# Patient Record
Sex: Male | Born: 1950 | Race: White | Hispanic: No | Marital: Married | Smoking: Current some day smoker
Health system: Southern US, Community
[De-identification: ages and names within clinical notes are randomized; demographics above are authoritative.]

## PROBLEM LIST (undated history)

## (undated) DIAGNOSIS — E271 Primary adrenocortical insufficiency: Secondary | ICD-10-CM

## (undated) DIAGNOSIS — D6861 Antiphospholipid syndrome: Secondary | ICD-10-CM

## (undated) DIAGNOSIS — Z8719 Personal history of other diseases of the digestive system: Secondary | ICD-10-CM

## (undated) DIAGNOSIS — C37 Malignant neoplasm of thymus: Secondary | ICD-10-CM

## (undated) DIAGNOSIS — G4733 Obstructive sleep apnea (adult) (pediatric): Secondary | ICD-10-CM

## (undated) DIAGNOSIS — K219 Gastro-esophageal reflux disease without esophagitis: Secondary | ICD-10-CM

## (undated) DIAGNOSIS — F32A Depression, unspecified: Secondary | ICD-10-CM

## (undated) DIAGNOSIS — Z9289 Personal history of other medical treatment: Secondary | ICD-10-CM

## (undated) DIAGNOSIS — I499 Cardiac arrhythmia, unspecified: Secondary | ICD-10-CM

## (undated) DIAGNOSIS — G4730 Sleep apnea, unspecified: Secondary | ICD-10-CM

## (undated) DIAGNOSIS — G0481 Other encephalitis and encephalomyelitis: Secondary | ICD-10-CM

## (undated) DIAGNOSIS — F329 Major depressive disorder, single episode, unspecified: Secondary | ICD-10-CM

## (undated) DIAGNOSIS — Z9989 Dependence on other enabling machines and devices: Secondary | ICD-10-CM

## (undated) HISTORY — DX: Personal history of other diseases of the digestive system: Z87.19

## (undated) HISTORY — DX: Major depressive disorder, single episode, unspecified: F32.9

## (undated) HISTORY — PX: ESOPHAGOGASTRODUODENOSCOPY: SHX1529

## (undated) HISTORY — DX: Obstructive sleep apnea (adult) (pediatric): G47.33

## (undated) HISTORY — DX: Sleep apnea, unspecified: G47.30

## (undated) HISTORY — PX: PACEMAKER PLACEMENT: SHX43

## (undated) HISTORY — DX: Primary adrenocortical insufficiency: E27.1

## (undated) HISTORY — DX: Depression, unspecified: F32.A

## (undated) HISTORY — DX: Dependence on other enabling machines and devices: Z99.89

## (undated) HISTORY — DX: Personal history of other medical treatment: Z92.89

## (undated) HISTORY — DX: Gastro-esophageal reflux disease without esophagitis: K21.9

## (undated) HISTORY — DX: Antiphospholipid syndrome: D68.61

## (undated) HISTORY — PX: NOSE SURGERY: SHX723

## (undated) HISTORY — DX: Cardiac arrhythmia, unspecified: I49.9

## (undated) HISTORY — DX: Other encephalitis and encephalomyelitis: G04.81

## (undated) HISTORY — DX: Malignant neoplasm of thymus: C37

---
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
## 1998-09-23 HISTORY — PX: CHOLECYSTECTOMY: SHX55

## 2013-09-23 DIAGNOSIS — C37 Malignant neoplasm of thymus: Secondary | ICD-10-CM

## 2013-09-23 HISTORY — DX: Malignant neoplasm of thymus: C37

## 2014-09-23 HISTORY — PX: COLONOSCOPY: SHX174

## 2015-01-06 ENCOUNTER — Encounter: Payer: Self-pay | Admitting: Gastroenterology

## 2016-09-24 DIAGNOSIS — F410 Panic disorder [episodic paroxysmal anxiety] without agoraphobia: Secondary | ICD-10-CM | POA: Diagnosis not present

## 2016-09-24 DIAGNOSIS — F331 Major depressive disorder, recurrent, moderate: Secondary | ICD-10-CM | POA: Diagnosis not present

## 2016-09-24 DIAGNOSIS — R4182 Altered mental status, unspecified: Secondary | ICD-10-CM | POA: Diagnosis not present

## 2016-09-24 DIAGNOSIS — G9340 Encephalopathy, unspecified: Secondary | ICD-10-CM | POA: Diagnosis not present

## 2016-09-24 DIAGNOSIS — C37 Malignant neoplasm of thymus: Secondary | ICD-10-CM | POA: Diagnosis not present

## 2016-09-27 DIAGNOSIS — G0490 Encephalitis and encephalomyelitis, unspecified: Secondary | ICD-10-CM | POA: Diagnosis not present

## 2016-09-27 DIAGNOSIS — Z599 Problem related to housing and economic circumstances, unspecified: Secondary | ICD-10-CM | POA: Diagnosis not present

## 2016-09-30 DIAGNOSIS — R791 Abnormal coagulation profile: Secondary | ICD-10-CM | POA: Diagnosis not present

## 2016-09-30 DIAGNOSIS — D6861 Antiphospholipid syndrome: Secondary | ICD-10-CM | POA: Diagnosis not present

## 2016-09-30 DIAGNOSIS — Z86718 Personal history of other venous thrombosis and embolism: Secondary | ICD-10-CM | POA: Diagnosis not present

## 2016-09-30 DIAGNOSIS — Z5181 Encounter for therapeutic drug level monitoring: Secondary | ICD-10-CM | POA: Diagnosis not present

## 2016-09-30 DIAGNOSIS — Z7901 Long term (current) use of anticoagulants: Secondary | ICD-10-CM | POA: Diagnosis not present

## 2016-10-03 DIAGNOSIS — F331 Major depressive disorder, recurrent, moderate: Secondary | ICD-10-CM | POA: Diagnosis not present

## 2016-10-03 DIAGNOSIS — Z79899 Other long term (current) drug therapy: Secondary | ICD-10-CM | POA: Diagnosis not present

## 2016-10-03 DIAGNOSIS — G9340 Encephalopathy, unspecified: Secondary | ICD-10-CM | POA: Diagnosis not present

## 2016-10-03 DIAGNOSIS — F410 Panic disorder [episodic paroxysmal anxiety] without agoraphobia: Secondary | ICD-10-CM | POA: Diagnosis not present

## 2016-10-03 DIAGNOSIS — C37 Malignant neoplasm of thymus: Secondary | ICD-10-CM | POA: Diagnosis not present

## 2016-10-03 DIAGNOSIS — G0490 Encephalitis and encephalomyelitis, unspecified: Secondary | ICD-10-CM | POA: Diagnosis not present

## 2016-10-03 DIAGNOSIS — Z139 Encounter for screening, unspecified: Secondary | ICD-10-CM | POA: Diagnosis not present

## 2016-10-04 DIAGNOSIS — F419 Anxiety disorder, unspecified: Secondary | ICD-10-CM | POA: Diagnosis not present

## 2016-10-04 DIAGNOSIS — Z86718 Personal history of other venous thrombosis and embolism: Secondary | ICD-10-CM | POA: Diagnosis not present

## 2016-10-04 DIAGNOSIS — Z85238 Personal history of other malignant neoplasm of thymus: Secondary | ICD-10-CM | POA: Diagnosis not present

## 2016-10-04 DIAGNOSIS — Z5181 Encounter for therapeutic drug level monitoring: Secondary | ICD-10-CM | POA: Diagnosis not present

## 2016-10-04 DIAGNOSIS — D6861 Antiphospholipid syndrome: Secondary | ICD-10-CM | POA: Diagnosis not present

## 2016-10-04 DIAGNOSIS — R569 Unspecified convulsions: Secondary | ICD-10-CM | POA: Diagnosis not present

## 2016-10-04 DIAGNOSIS — F329 Major depressive disorder, single episode, unspecified: Secondary | ICD-10-CM | POA: Diagnosis not present

## 2016-10-04 DIAGNOSIS — R791 Abnormal coagulation profile: Secondary | ICD-10-CM | POA: Diagnosis not present

## 2016-10-04 DIAGNOSIS — Z7901 Long term (current) use of anticoagulants: Secondary | ICD-10-CM | POA: Diagnosis not present

## 2016-10-04 DIAGNOSIS — K219 Gastro-esophageal reflux disease without esophagitis: Secondary | ICD-10-CM | POA: Diagnosis not present

## 2016-10-10 DIAGNOSIS — K219 Gastro-esophageal reflux disease without esophagitis: Secondary | ICD-10-CM | POA: Diagnosis present

## 2016-10-10 DIAGNOSIS — E871 Hypo-osmolality and hyponatremia: Secondary | ICD-10-CM | POA: Diagnosis not present

## 2016-10-10 DIAGNOSIS — Z950 Presence of cardiac pacemaker: Secondary | ICD-10-CM | POA: Diagnosis not present

## 2016-10-10 DIAGNOSIS — F329 Major depressive disorder, single episode, unspecified: Secondary | ICD-10-CM | POA: Diagnosis present

## 2016-10-10 DIAGNOSIS — Z923 Personal history of irradiation: Secondary | ICD-10-CM | POA: Diagnosis not present

## 2016-10-10 DIAGNOSIS — G4733 Obstructive sleep apnea (adult) (pediatric): Secondary | ICD-10-CM | POA: Diagnosis present

## 2016-10-10 DIAGNOSIS — G0481 Other encephalitis and encephalomyelitis: Secondary | ICD-10-CM | POA: Diagnosis not present

## 2016-10-10 DIAGNOSIS — Z7952 Long term (current) use of systemic steroids: Secondary | ICD-10-CM | POA: Diagnosis not present

## 2016-10-10 DIAGNOSIS — Z5189 Encounter for other specified aftercare: Secondary | ICD-10-CM | POA: Diagnosis not present

## 2016-10-10 DIAGNOSIS — Z79899 Other long term (current) drug therapy: Secondary | ICD-10-CM | POA: Diagnosis not present

## 2016-10-10 DIAGNOSIS — K612 Anorectal abscess: Secondary | ICD-10-CM | POA: Diagnosis not present

## 2016-10-10 DIAGNOSIS — R221 Localized swelling, mass and lump, neck: Secondary | ICD-10-CM | POA: Diagnosis not present

## 2016-10-10 DIAGNOSIS — G40409 Other generalized epilepsy and epileptic syndromes, not intractable, without status epilepticus: Secondary | ICD-10-CM | POA: Diagnosis not present

## 2016-10-10 DIAGNOSIS — F410 Panic disorder [episodic paroxysmal anxiety] without agoraphobia: Secondary | ICD-10-CM | POA: Diagnosis present

## 2016-10-10 DIAGNOSIS — R791 Abnormal coagulation profile: Secondary | ICD-10-CM | POA: Diagnosis not present

## 2016-10-10 DIAGNOSIS — R410 Disorientation, unspecified: Secondary | ICD-10-CM | POA: Diagnosis not present

## 2016-10-10 DIAGNOSIS — E2740 Unspecified adrenocortical insufficiency: Secondary | ICD-10-CM | POA: Diagnosis not present

## 2016-10-10 DIAGNOSIS — Z9221 Personal history of antineoplastic chemotherapy: Secondary | ICD-10-CM | POA: Diagnosis not present

## 2016-10-10 DIAGNOSIS — Z85238 Personal history of other malignant neoplasm of thymus: Secondary | ICD-10-CM | POA: Diagnosis not present

## 2016-10-10 DIAGNOSIS — R911 Solitary pulmonary nodule: Secondary | ICD-10-CM | POA: Diagnosis not present

## 2016-10-10 DIAGNOSIS — Z86718 Personal history of other venous thrombosis and embolism: Secondary | ICD-10-CM | POA: Diagnosis not present

## 2016-10-10 DIAGNOSIS — K610 Anal abscess: Secondary | ICD-10-CM | POA: Diagnosis not present

## 2016-10-10 DIAGNOSIS — J90 Pleural effusion, not elsewhere classified: Secondary | ICD-10-CM | POA: Diagnosis not present

## 2016-10-10 DIAGNOSIS — M7981 Nontraumatic hematoma of soft tissue: Secondary | ICD-10-CM | POA: Diagnosis not present

## 2016-10-10 DIAGNOSIS — Z7901 Long term (current) use of anticoagulants: Secondary | ICD-10-CM | POA: Diagnosis not present

## 2016-10-10 DIAGNOSIS — G0490 Encephalitis and encephalomyelitis, unspecified: Secondary | ICD-10-CM | POA: Diagnosis not present

## 2016-10-10 DIAGNOSIS — Z452 Encounter for adjustment and management of vascular access device: Secondary | ICD-10-CM | POA: Diagnosis not present

## 2016-10-10 DIAGNOSIS — K589 Irritable bowel syndrome without diarrhea: Secondary | ICD-10-CM | POA: Diagnosis present

## 2016-10-10 DIAGNOSIS — R451 Restlessness and agitation: Secondary | ICD-10-CM | POA: Diagnosis not present

## 2016-10-10 DIAGNOSIS — D6861 Antiphospholipid syndrome: Secondary | ICD-10-CM | POA: Diagnosis not present

## 2016-10-10 DIAGNOSIS — C37 Malignant neoplasm of thymus: Secondary | ICD-10-CM | POA: Diagnosis not present

## 2016-10-25 DIAGNOSIS — D8989 Other specified disorders involving the immune mechanism, not elsewhere classified: Secondary | ICD-10-CM | POA: Diagnosis present

## 2016-10-25 DIAGNOSIS — Z48817 Encounter for surgical aftercare following surgery on the skin and subcutaneous tissue: Secondary | ICD-10-CM | POA: Diagnosis not present

## 2016-10-25 DIAGNOSIS — Z9221 Personal history of antineoplastic chemotherapy: Secondary | ICD-10-CM | POA: Diagnosis not present

## 2016-10-25 DIAGNOSIS — K589 Irritable bowel syndrome without diarrhea: Secondary | ICD-10-CM | POA: Diagnosis present

## 2016-10-25 DIAGNOSIS — D6861 Antiphospholipid syndrome: Secondary | ICD-10-CM | POA: Diagnosis not present

## 2016-10-25 DIAGNOSIS — Z7901 Long term (current) use of anticoagulants: Secondary | ICD-10-CM | POA: Diagnosis not present

## 2016-10-25 DIAGNOSIS — K612 Anorectal abscess: Secondary | ICD-10-CM | POA: Diagnosis not present

## 2016-10-25 DIAGNOSIS — G9340 Encephalopathy, unspecified: Secondary | ICD-10-CM | POA: Diagnosis not present

## 2016-10-25 DIAGNOSIS — G40409 Other generalized epilepsy and epileptic syndromes, not intractable, without status epilepticus: Secondary | ICD-10-CM | POA: Diagnosis not present

## 2016-10-25 DIAGNOSIS — C781 Secondary malignant neoplasm of mediastinum: Secondary | ICD-10-CM | POA: Diagnosis present

## 2016-10-25 DIAGNOSIS — Z950 Presence of cardiac pacemaker: Secondary | ICD-10-CM | POA: Diagnosis not present

## 2016-10-25 DIAGNOSIS — Z923 Personal history of irradiation: Secondary | ICD-10-CM | POA: Diagnosis not present

## 2016-10-25 DIAGNOSIS — G40909 Epilepsy, unspecified, not intractable, without status epilepticus: Secondary | ICD-10-CM | POA: Diagnosis not present

## 2016-10-25 DIAGNOSIS — C37 Malignant neoplasm of thymus: Secondary | ICD-10-CM | POA: Diagnosis not present

## 2016-10-25 DIAGNOSIS — Z85238 Personal history of other malignant neoplasm of thymus: Secondary | ICD-10-CM | POA: Diagnosis not present

## 2016-10-25 DIAGNOSIS — Z7952 Long term (current) use of systemic steroids: Secondary | ICD-10-CM | POA: Diagnosis not present

## 2016-10-25 DIAGNOSIS — R410 Disorientation, unspecified: Secondary | ICD-10-CM | POA: Diagnosis not present

## 2016-10-25 DIAGNOSIS — K6289 Other specified diseases of anus and rectum: Secondary | ICD-10-CM | POA: Diagnosis not present

## 2016-10-25 DIAGNOSIS — R531 Weakness: Secondary | ICD-10-CM | POA: Diagnosis not present

## 2016-10-25 DIAGNOSIS — G0481 Other encephalitis and encephalomyelitis: Secondary | ICD-10-CM | POA: Diagnosis not present

## 2016-10-25 DIAGNOSIS — G0490 Encephalitis and encephalomyelitis, unspecified: Secondary | ICD-10-CM | POA: Diagnosis not present

## 2016-10-25 DIAGNOSIS — F05 Delirium due to known physiological condition: Secondary | ICD-10-CM | POA: Diagnosis not present

## 2016-10-25 DIAGNOSIS — K219 Gastro-esophageal reflux disease without esophagitis: Secondary | ICD-10-CM | POA: Diagnosis present

## 2016-10-25 DIAGNOSIS — Z7902 Long term (current) use of antithrombotics/antiplatelets: Secondary | ICD-10-CM | POA: Diagnosis not present

## 2016-10-25 DIAGNOSIS — M6250 Muscle wasting and atrophy, not elsewhere classified, unspecified site: Secondary | ICD-10-CM | POA: Diagnosis not present

## 2016-10-25 DIAGNOSIS — Z86718 Personal history of other venous thrombosis and embolism: Secondary | ICD-10-CM | POA: Diagnosis not present

## 2016-10-25 DIAGNOSIS — G4733 Obstructive sleep apnea (adult) (pediatric): Secondary | ICD-10-CM | POA: Diagnosis present

## 2016-10-25 DIAGNOSIS — Z883 Allergy status to other anti-infective agents status: Secondary | ICD-10-CM | POA: Diagnosis not present

## 2016-10-25 DIAGNOSIS — E2740 Unspecified adrenocortical insufficiency: Secondary | ICD-10-CM | POA: Diagnosis not present

## 2016-10-25 DIAGNOSIS — F331 Major depressive disorder, recurrent, moderate: Secondary | ICD-10-CM | POA: Diagnosis not present

## 2016-10-25 DIAGNOSIS — F419 Anxiety disorder, unspecified: Secondary | ICD-10-CM | POA: Diagnosis not present

## 2016-10-25 DIAGNOSIS — Z881 Allergy status to other antibiotic agents status: Secondary | ICD-10-CM | POA: Diagnosis not present

## 2016-11-02 DIAGNOSIS — D6861 Antiphospholipid syndrome: Secondary | ICD-10-CM | POA: Diagnosis not present

## 2016-11-02 DIAGNOSIS — K611 Rectal abscess: Secondary | ICD-10-CM | POA: Diagnosis not present

## 2016-11-02 DIAGNOSIS — M359 Systemic involvement of connective tissue, unspecified: Secondary | ICD-10-CM | POA: Diagnosis not present

## 2016-11-02 DIAGNOSIS — G053 Encephalitis and encephalomyelitis in diseases classified elsewhere: Secondary | ICD-10-CM | POA: Diagnosis not present

## 2016-11-02 DIAGNOSIS — E2740 Unspecified adrenocortical insufficiency: Secondary | ICD-10-CM | POA: Diagnosis not present

## 2016-11-02 DIAGNOSIS — R569 Unspecified convulsions: Secondary | ICD-10-CM | POA: Diagnosis not present

## 2016-11-04 DIAGNOSIS — Z7901 Long term (current) use of anticoagulants: Secondary | ICD-10-CM | POA: Diagnosis not present

## 2016-11-04 DIAGNOSIS — D6861 Antiphospholipid syndrome: Secondary | ICD-10-CM | POA: Diagnosis not present

## 2016-11-04 DIAGNOSIS — Z86718 Personal history of other venous thrombosis and embolism: Secondary | ICD-10-CM | POA: Diagnosis not present

## 2016-11-04 DIAGNOSIS — Z5181 Encounter for therapeutic drug level monitoring: Secondary | ICD-10-CM | POA: Diagnosis not present

## 2016-11-06 DIAGNOSIS — M359 Systemic involvement of connective tissue, unspecified: Secondary | ICD-10-CM | POA: Diagnosis not present

## 2016-11-06 DIAGNOSIS — E2740 Unspecified adrenocortical insufficiency: Secondary | ICD-10-CM | POA: Diagnosis not present

## 2016-11-06 DIAGNOSIS — K611 Rectal abscess: Secondary | ICD-10-CM | POA: Diagnosis not present

## 2016-11-06 DIAGNOSIS — G053 Encephalitis and encephalomyelitis in diseases classified elsewhere: Secondary | ICD-10-CM | POA: Diagnosis not present

## 2016-11-06 DIAGNOSIS — D6861 Antiphospholipid syndrome: Secondary | ICD-10-CM | POA: Diagnosis not present

## 2016-11-06 DIAGNOSIS — R569 Unspecified convulsions: Secondary | ICD-10-CM | POA: Diagnosis not present

## 2016-11-07 DIAGNOSIS — G0481 Other encephalitis and encephalomyelitis: Secondary | ICD-10-CM | POA: Diagnosis not present

## 2016-11-07 DIAGNOSIS — E2740 Unspecified adrenocortical insufficiency: Secondary | ICD-10-CM | POA: Diagnosis not present

## 2016-11-07 DIAGNOSIS — D6861 Antiphospholipid syndrome: Secondary | ICD-10-CM | POA: Diagnosis not present

## 2016-11-07 DIAGNOSIS — I442 Atrioventricular block, complete: Secondary | ICD-10-CM | POA: Diagnosis not present

## 2016-11-07 DIAGNOSIS — F3341 Major depressive disorder, recurrent, in partial remission: Secondary | ICD-10-CM | POA: Diagnosis not present

## 2016-11-07 DIAGNOSIS — C37 Malignant neoplasm of thymus: Secondary | ICD-10-CM | POA: Diagnosis not present

## 2016-11-07 DIAGNOSIS — G4733 Obstructive sleep apnea (adult) (pediatric): Secondary | ICD-10-CM | POA: Diagnosis not present

## 2016-11-07 DIAGNOSIS — G40409 Other generalized epilepsy and epileptic syndromes, not intractable, without status epilepticus: Secondary | ICD-10-CM | POA: Diagnosis not present

## 2016-11-08 DIAGNOSIS — G0490 Encephalitis and encephalomyelitis, unspecified: Secondary | ICD-10-CM | POA: Diagnosis not present

## 2016-11-12 DIAGNOSIS — R791 Abnormal coagulation profile: Secondary | ICD-10-CM | POA: Diagnosis not present

## 2016-11-12 DIAGNOSIS — D6861 Antiphospholipid syndrome: Secondary | ICD-10-CM | POA: Diagnosis not present

## 2016-11-12 DIAGNOSIS — Z5181 Encounter for therapeutic drug level monitoring: Secondary | ICD-10-CM | POA: Diagnosis not present

## 2016-11-12 DIAGNOSIS — Z7901 Long term (current) use of anticoagulants: Secondary | ICD-10-CM | POA: Diagnosis not present

## 2016-11-12 DIAGNOSIS — Z86718 Personal history of other venous thrombosis and embolism: Secondary | ICD-10-CM | POA: Diagnosis not present

## 2016-11-13 DIAGNOSIS — R569 Unspecified convulsions: Secondary | ICD-10-CM | POA: Diagnosis not present

## 2016-11-13 DIAGNOSIS — M359 Systemic involvement of connective tissue, unspecified: Secondary | ICD-10-CM | POA: Diagnosis not present

## 2016-11-13 DIAGNOSIS — K611 Rectal abscess: Secondary | ICD-10-CM | POA: Diagnosis not present

## 2016-11-13 DIAGNOSIS — D6861 Antiphospholipid syndrome: Secondary | ICD-10-CM | POA: Diagnosis not present

## 2016-11-13 DIAGNOSIS — E2740 Unspecified adrenocortical insufficiency: Secondary | ICD-10-CM | POA: Diagnosis not present

## 2016-11-13 DIAGNOSIS — G053 Encephalitis and encephalomyelitis in diseases classified elsewhere: Secondary | ICD-10-CM | POA: Diagnosis not present

## 2016-11-14 DIAGNOSIS — Z5181 Encounter for therapeutic drug level monitoring: Secondary | ICD-10-CM | POA: Diagnosis not present

## 2016-11-14 DIAGNOSIS — F410 Panic disorder [episodic paroxysmal anxiety] without agoraphobia: Secondary | ICD-10-CM | POA: Diagnosis not present

## 2016-11-14 DIAGNOSIS — G9340 Encephalopathy, unspecified: Secondary | ICD-10-CM | POA: Diagnosis not present

## 2016-11-14 DIAGNOSIS — D6861 Antiphospholipid syndrome: Secondary | ICD-10-CM | POA: Diagnosis not present

## 2016-11-14 DIAGNOSIS — Z86718 Personal history of other venous thrombosis and embolism: Secondary | ICD-10-CM | POA: Diagnosis not present

## 2016-11-14 DIAGNOSIS — F331 Major depressive disorder, recurrent, moderate: Secondary | ICD-10-CM | POA: Diagnosis not present

## 2016-11-14 DIAGNOSIS — C37 Malignant neoplasm of thymus: Secondary | ICD-10-CM | POA: Diagnosis not present

## 2016-11-14 DIAGNOSIS — Z7901 Long term (current) use of anticoagulants: Secondary | ICD-10-CM | POA: Diagnosis not present

## 2016-11-15 DIAGNOSIS — G053 Encephalitis and encephalomyelitis in diseases classified elsewhere: Secondary | ICD-10-CM | POA: Diagnosis not present

## 2016-11-15 DIAGNOSIS — K611 Rectal abscess: Secondary | ICD-10-CM | POA: Diagnosis not present

## 2016-11-15 DIAGNOSIS — M359 Systemic involvement of connective tissue, unspecified: Secondary | ICD-10-CM | POA: Diagnosis not present

## 2016-11-15 DIAGNOSIS — E2740 Unspecified adrenocortical insufficiency: Secondary | ICD-10-CM | POA: Diagnosis not present

## 2016-11-15 DIAGNOSIS — R569 Unspecified convulsions: Secondary | ICD-10-CM | POA: Diagnosis not present

## 2016-11-15 DIAGNOSIS — D6861 Antiphospholipid syndrome: Secondary | ICD-10-CM | POA: Diagnosis not present

## 2016-11-19 DIAGNOSIS — R569 Unspecified convulsions: Secondary | ICD-10-CM | POA: Diagnosis not present

## 2016-11-19 DIAGNOSIS — M359 Systemic involvement of connective tissue, unspecified: Secondary | ICD-10-CM | POA: Diagnosis not present

## 2016-11-19 DIAGNOSIS — K611 Rectal abscess: Secondary | ICD-10-CM | POA: Diagnosis not present

## 2016-11-19 DIAGNOSIS — E2740 Unspecified adrenocortical insufficiency: Secondary | ICD-10-CM | POA: Diagnosis not present

## 2016-11-19 DIAGNOSIS — D6861 Antiphospholipid syndrome: Secondary | ICD-10-CM | POA: Diagnosis not present

## 2016-11-19 DIAGNOSIS — G053 Encephalitis and encephalomyelitis in diseases classified elsewhere: Secondary | ICD-10-CM | POA: Diagnosis not present

## 2016-11-21 DIAGNOSIS — Z7901 Long term (current) use of anticoagulants: Secondary | ICD-10-CM | POA: Diagnosis not present

## 2016-11-21 DIAGNOSIS — Z86718 Personal history of other venous thrombosis and embolism: Secondary | ICD-10-CM | POA: Diagnosis not present

## 2016-11-21 DIAGNOSIS — D6861 Antiphospholipid syndrome: Secondary | ICD-10-CM | POA: Diagnosis not present

## 2016-11-21 DIAGNOSIS — Z5181 Encounter for therapeutic drug level monitoring: Secondary | ICD-10-CM | POA: Diagnosis not present

## 2016-11-26 DIAGNOSIS — Z79899 Other long term (current) drug therapy: Secondary | ICD-10-CM | POA: Diagnosis not present

## 2016-11-26 DIAGNOSIS — I442 Atrioventricular block, complete: Secondary | ICD-10-CM | POA: Diagnosis not present

## 2016-11-26 DIAGNOSIS — Z950 Presence of cardiac pacemaker: Secondary | ICD-10-CM | POA: Diagnosis not present

## 2016-11-26 DIAGNOSIS — Z85238 Personal history of other malignant neoplasm of thymus: Secondary | ICD-10-CM | POA: Diagnosis not present

## 2016-11-26 DIAGNOSIS — G0481 Other encephalitis and encephalomyelitis: Secondary | ICD-10-CM | POA: Diagnosis not present

## 2016-11-26 DIAGNOSIS — Z7901 Long term (current) use of anticoagulants: Secondary | ICD-10-CM | POA: Diagnosis not present

## 2016-11-26 DIAGNOSIS — D6861 Antiphospholipid syndrome: Secondary | ICD-10-CM | POA: Diagnosis not present

## 2016-11-26 DIAGNOSIS — M7989 Other specified soft tissue disorders: Secondary | ICD-10-CM | POA: Diagnosis not present

## 2016-11-26 DIAGNOSIS — E2740 Unspecified adrenocortical insufficiency: Secondary | ICD-10-CM | POA: Diagnosis not present

## 2016-11-26 DIAGNOSIS — C37 Malignant neoplasm of thymus: Secondary | ICD-10-CM | POA: Diagnosis not present

## 2016-11-26 DIAGNOSIS — I10 Essential (primary) hypertension: Secondary | ICD-10-CM | POA: Diagnosis not present

## 2016-11-26 DIAGNOSIS — Z7952 Long term (current) use of systemic steroids: Secondary | ICD-10-CM | POA: Diagnosis not present

## 2016-11-28 DIAGNOSIS — Z86718 Personal history of other venous thrombosis and embolism: Secondary | ICD-10-CM | POA: Diagnosis not present

## 2016-11-28 DIAGNOSIS — Z5181 Encounter for therapeutic drug level monitoring: Secondary | ICD-10-CM | POA: Diagnosis not present

## 2016-11-28 DIAGNOSIS — D6861 Antiphospholipid syndrome: Secondary | ICD-10-CM | POA: Diagnosis not present

## 2016-11-28 DIAGNOSIS — C37 Malignant neoplasm of thymus: Secondary | ICD-10-CM | POA: Diagnosis not present

## 2016-11-28 DIAGNOSIS — R791 Abnormal coagulation profile: Secondary | ICD-10-CM | POA: Diagnosis not present

## 2016-11-28 DIAGNOSIS — Z7901 Long term (current) use of anticoagulants: Secondary | ICD-10-CM | POA: Diagnosis not present

## 2016-12-03 DIAGNOSIS — G9340 Encephalopathy, unspecified: Secondary | ICD-10-CM | POA: Diagnosis not present

## 2016-12-03 DIAGNOSIS — F410 Panic disorder [episodic paroxysmal anxiety] without agoraphobia: Secondary | ICD-10-CM | POA: Diagnosis not present

## 2016-12-03 DIAGNOSIS — F339 Major depressive disorder, recurrent, unspecified: Secondary | ICD-10-CM | POA: Diagnosis not present

## 2016-12-03 DIAGNOSIS — C37 Malignant neoplasm of thymus: Secondary | ICD-10-CM | POA: Diagnosis not present

## 2016-12-04 DIAGNOSIS — Z86718 Personal history of other venous thrombosis and embolism: Secondary | ICD-10-CM | POA: Diagnosis not present

## 2016-12-04 DIAGNOSIS — Z5181 Encounter for therapeutic drug level monitoring: Secondary | ICD-10-CM | POA: Diagnosis not present

## 2016-12-04 DIAGNOSIS — Z7901 Long term (current) use of anticoagulants: Secondary | ICD-10-CM | POA: Diagnosis not present

## 2016-12-04 DIAGNOSIS — D6861 Antiphospholipid syndrome: Secondary | ICD-10-CM | POA: Diagnosis not present

## 2016-12-09 DIAGNOSIS — D6861 Antiphospholipid syndrome: Secondary | ICD-10-CM | POA: Diagnosis not present

## 2016-12-11 DIAGNOSIS — Z7901 Long term (current) use of anticoagulants: Secondary | ICD-10-CM | POA: Diagnosis not present

## 2016-12-11 DIAGNOSIS — Z5181 Encounter for therapeutic drug level monitoring: Secondary | ICD-10-CM | POA: Diagnosis not present

## 2016-12-11 DIAGNOSIS — D6861 Antiphospholipid syndrome: Secondary | ICD-10-CM | POA: Diagnosis not present

## 2016-12-11 DIAGNOSIS — Z86718 Personal history of other venous thrombosis and embolism: Secondary | ICD-10-CM | POA: Diagnosis not present

## 2016-12-19 DIAGNOSIS — F410 Panic disorder [episodic paroxysmal anxiety] without agoraphobia: Secondary | ICD-10-CM | POA: Diagnosis not present

## 2016-12-19 DIAGNOSIS — G9340 Encephalopathy, unspecified: Secondary | ICD-10-CM | POA: Diagnosis not present

## 2016-12-19 DIAGNOSIS — F331 Major depressive disorder, recurrent, moderate: Secondary | ICD-10-CM | POA: Diagnosis not present

## 2016-12-19 DIAGNOSIS — Z86718 Personal history of other venous thrombosis and embolism: Secondary | ICD-10-CM | POA: Diagnosis not present

## 2016-12-19 DIAGNOSIS — C37 Malignant neoplasm of thymus: Secondary | ICD-10-CM | POA: Diagnosis not present

## 2016-12-19 DIAGNOSIS — Z5181 Encounter for therapeutic drug level monitoring: Secondary | ICD-10-CM | POA: Diagnosis not present

## 2016-12-19 DIAGNOSIS — Z7901 Long term (current) use of anticoagulants: Secondary | ICD-10-CM | POA: Diagnosis not present

## 2016-12-19 DIAGNOSIS — D6861 Antiphospholipid syndrome: Secondary | ICD-10-CM | POA: Diagnosis not present

## 2016-12-23 DIAGNOSIS — G40409 Other generalized epilepsy and epileptic syndromes, not intractable, without status epilepticus: Secondary | ICD-10-CM | POA: Diagnosis not present

## 2016-12-23 DIAGNOSIS — Z5181 Encounter for therapeutic drug level monitoring: Secondary | ICD-10-CM | POA: Diagnosis not present

## 2016-12-23 DIAGNOSIS — G0481 Other encephalitis and encephalomyelitis: Secondary | ICD-10-CM | POA: Diagnosis not present

## 2016-12-24 DIAGNOSIS — Z950 Presence of cardiac pacemaker: Secondary | ICD-10-CM | POA: Diagnosis not present

## 2016-12-30 DIAGNOSIS — Z7901 Long term (current) use of anticoagulants: Secondary | ICD-10-CM | POA: Diagnosis not present

## 2016-12-30 DIAGNOSIS — Z5181 Encounter for therapeutic drug level monitoring: Secondary | ICD-10-CM | POA: Diagnosis not present

## 2016-12-30 DIAGNOSIS — D6861 Antiphospholipid syndrome: Secondary | ICD-10-CM | POA: Diagnosis not present

## 2016-12-30 DIAGNOSIS — Z86718 Personal history of other venous thrombosis and embolism: Secondary | ICD-10-CM | POA: Diagnosis not present

## 2017-01-08 DIAGNOSIS — G0481 Other encephalitis and encephalomyelitis: Secondary | ICD-10-CM | POA: Diagnosis not present

## 2017-01-09 DIAGNOSIS — Z86718 Personal history of other venous thrombosis and embolism: Secondary | ICD-10-CM | POA: Diagnosis not present

## 2017-01-09 DIAGNOSIS — Z5181 Encounter for therapeutic drug level monitoring: Secondary | ICD-10-CM | POA: Diagnosis not present

## 2017-01-09 DIAGNOSIS — Z7901 Long term (current) use of anticoagulants: Secondary | ICD-10-CM | POA: Diagnosis not present

## 2017-01-09 DIAGNOSIS — D6861 Antiphospholipid syndrome: Secondary | ICD-10-CM | POA: Diagnosis not present

## 2017-01-13 DIAGNOSIS — Z7901 Long term (current) use of anticoagulants: Secondary | ICD-10-CM | POA: Diagnosis not present

## 2017-01-13 DIAGNOSIS — D6861 Antiphospholipid syndrome: Secondary | ICD-10-CM | POA: Diagnosis not present

## 2017-01-16 DIAGNOSIS — G9340 Encephalopathy, unspecified: Secondary | ICD-10-CM | POA: Diagnosis not present

## 2017-01-16 DIAGNOSIS — F410 Panic disorder [episodic paroxysmal anxiety] without agoraphobia: Secondary | ICD-10-CM | POA: Diagnosis not present

## 2017-01-16 DIAGNOSIS — C37 Malignant neoplasm of thymus: Secondary | ICD-10-CM | POA: Diagnosis not present

## 2017-01-16 DIAGNOSIS — F331 Major depressive disorder, recurrent, moderate: Secondary | ICD-10-CM | POA: Diagnosis not present

## 2017-01-17 DIAGNOSIS — Z862 Personal history of diseases of the blood and blood-forming organs and certain disorders involving the immune mechanism: Secondary | ICD-10-CM | POA: Diagnosis not present

## 2017-01-17 DIAGNOSIS — R569 Unspecified convulsions: Secondary | ICD-10-CM | POA: Diagnosis not present

## 2017-01-28 DIAGNOSIS — F3341 Major depressive disorder, recurrent, in partial remission: Secondary | ICD-10-CM | POA: Diagnosis not present

## 2017-01-28 DIAGNOSIS — I442 Atrioventricular block, complete: Secondary | ICD-10-CM | POA: Diagnosis not present

## 2017-01-28 DIAGNOSIS — K219 Gastro-esophageal reflux disease without esophagitis: Secondary | ICD-10-CM | POA: Diagnosis not present

## 2017-01-28 DIAGNOSIS — G0481 Other encephalitis and encephalomyelitis: Secondary | ICD-10-CM | POA: Diagnosis not present

## 2017-01-28 DIAGNOSIS — G40909 Epilepsy, unspecified, not intractable, without status epilepticus: Secondary | ICD-10-CM | POA: Diagnosis not present

## 2017-01-28 DIAGNOSIS — C37 Malignant neoplasm of thymus: Secondary | ICD-10-CM | POA: Diagnosis not present

## 2017-01-28 DIAGNOSIS — E2740 Unspecified adrenocortical insufficiency: Secondary | ICD-10-CM | POA: Diagnosis not present

## 2017-01-29 DIAGNOSIS — Z7901 Long term (current) use of anticoagulants: Secondary | ICD-10-CM | POA: Diagnosis not present

## 2017-01-29 DIAGNOSIS — Z5181 Encounter for therapeutic drug level monitoring: Secondary | ICD-10-CM | POA: Diagnosis not present

## 2017-01-29 DIAGNOSIS — D6861 Antiphospholipid syndrome: Secondary | ICD-10-CM | POA: Diagnosis not present

## 2017-01-29 DIAGNOSIS — Z86718 Personal history of other venous thrombosis and embolism: Secondary | ICD-10-CM | POA: Diagnosis not present

## 2017-02-04 DIAGNOSIS — G40409 Other generalized epilepsy and epileptic syndromes, not intractable, without status epilepticus: Secondary | ICD-10-CM | POA: Diagnosis not present

## 2017-02-04 DIAGNOSIS — G0490 Encephalitis and encephalomyelitis, unspecified: Secondary | ICD-10-CM | POA: Diagnosis not present

## 2017-02-04 DIAGNOSIS — F064 Anxiety disorder due to known physiological condition: Secondary | ICD-10-CM | POA: Diagnosis not present

## 2017-02-04 DIAGNOSIS — Z5181 Encounter for therapeutic drug level monitoring: Secondary | ICD-10-CM | POA: Diagnosis not present

## 2017-02-13 DIAGNOSIS — D6861 Antiphospholipid syndrome: Secondary | ICD-10-CM | POA: Diagnosis not present

## 2017-02-13 DIAGNOSIS — I442 Atrioventricular block, complete: Secondary | ICD-10-CM | POA: Diagnosis not present

## 2017-02-13 DIAGNOSIS — D6859 Other primary thrombophilia: Secondary | ICD-10-CM | POA: Diagnosis not present

## 2017-02-13 DIAGNOSIS — Z45018 Encounter for adjustment and management of other part of cardiac pacemaker: Secondary | ICD-10-CM | POA: Diagnosis not present

## 2017-02-13 DIAGNOSIS — G4733 Obstructive sleep apnea (adult) (pediatric): Secondary | ICD-10-CM | POA: Diagnosis not present

## 2017-02-13 DIAGNOSIS — Z9989 Dependence on other enabling machines and devices: Secondary | ICD-10-CM | POA: Diagnosis not present

## 2017-02-13 DIAGNOSIS — Z7901 Long term (current) use of anticoagulants: Secondary | ICD-10-CM | POA: Diagnosis not present

## 2017-02-13 DIAGNOSIS — E2740 Unspecified adrenocortical insufficiency: Secondary | ICD-10-CM | POA: Diagnosis not present

## 2017-02-13 DIAGNOSIS — C37 Malignant neoplasm of thymus: Secondary | ICD-10-CM | POA: Diagnosis not present

## 2017-02-13 DIAGNOSIS — R569 Unspecified convulsions: Secondary | ICD-10-CM | POA: Diagnosis not present

## 2017-02-19 DIAGNOSIS — D6861 Antiphospholipid syndrome: Secondary | ICD-10-CM | POA: Diagnosis not present

## 2017-02-19 DIAGNOSIS — Z86718 Personal history of other venous thrombosis and embolism: Secondary | ICD-10-CM | POA: Diagnosis not present

## 2017-02-19 DIAGNOSIS — Z7901 Long term (current) use of anticoagulants: Secondary | ICD-10-CM | POA: Diagnosis not present

## 2017-02-19 DIAGNOSIS — Z5181 Encounter for therapeutic drug level monitoring: Secondary | ICD-10-CM | POA: Diagnosis not present

## 2017-02-25 DIAGNOSIS — F410 Panic disorder [episodic paroxysmal anxiety] without agoraphobia: Secondary | ICD-10-CM | POA: Diagnosis not present

## 2017-02-25 DIAGNOSIS — F331 Major depressive disorder, recurrent, moderate: Secondary | ICD-10-CM | POA: Diagnosis not present

## 2017-02-25 DIAGNOSIS — G9340 Encephalopathy, unspecified: Secondary | ICD-10-CM | POA: Diagnosis not present

## 2017-02-25 DIAGNOSIS — C37 Malignant neoplasm of thymus: Secondary | ICD-10-CM | POA: Diagnosis not present

## 2017-02-28 DIAGNOSIS — C37 Malignant neoplasm of thymus: Secondary | ICD-10-CM | POA: Diagnosis not present

## 2017-02-28 DIAGNOSIS — M545 Low back pain: Secondary | ICD-10-CM | POA: Diagnosis not present

## 2017-03-10 DIAGNOSIS — R61 Generalized hyperhidrosis: Secondary | ICD-10-CM | POA: Diagnosis not present

## 2017-03-10 DIAGNOSIS — I442 Atrioventricular block, complete: Secondary | ICD-10-CM | POA: Diagnosis not present

## 2017-03-10 DIAGNOSIS — G0490 Encephalitis and encephalomyelitis, unspecified: Secondary | ICD-10-CM | POA: Diagnosis not present

## 2017-03-10 DIAGNOSIS — F329 Major depressive disorder, single episode, unspecified: Secondary | ICD-10-CM | POA: Diagnosis not present

## 2017-03-10 DIAGNOSIS — R918 Other nonspecific abnormal finding of lung field: Secondary | ICD-10-CM | POA: Diagnosis not present

## 2017-03-10 DIAGNOSIS — G0481 Other encephalitis and encephalomyelitis: Secondary | ICD-10-CM | POA: Diagnosis not present

## 2017-03-10 DIAGNOSIS — Z950 Presence of cardiac pacemaker: Secondary | ICD-10-CM | POA: Diagnosis not present

## 2017-03-10 DIAGNOSIS — C7800 Secondary malignant neoplasm of unspecified lung: Secondary | ICD-10-CM | POA: Diagnosis not present

## 2017-03-10 DIAGNOSIS — R5383 Other fatigue: Secondary | ICD-10-CM | POA: Diagnosis not present

## 2017-03-10 DIAGNOSIS — J90 Pleural effusion, not elsewhere classified: Secondary | ICD-10-CM | POA: Diagnosis not present

## 2017-03-10 DIAGNOSIS — R438 Other disturbances of smell and taste: Secondary | ICD-10-CM | POA: Diagnosis not present

## 2017-03-10 DIAGNOSIS — D6861 Antiphospholipid syndrome: Secondary | ICD-10-CM | POA: Diagnosis not present

## 2017-03-10 DIAGNOSIS — Z8679 Personal history of other diseases of the circulatory system: Secondary | ICD-10-CM | POA: Diagnosis not present

## 2017-03-10 DIAGNOSIS — E2740 Unspecified adrenocortical insufficiency: Secondary | ICD-10-CM | POA: Diagnosis not present

## 2017-03-10 DIAGNOSIS — Z515 Encounter for palliative care: Secondary | ICD-10-CM | POA: Diagnosis not present

## 2017-03-10 DIAGNOSIS — Z8659 Personal history of other mental and behavioral disorders: Secondary | ICD-10-CM | POA: Diagnosis not present

## 2017-03-10 DIAGNOSIS — C37 Malignant neoplasm of thymus: Secondary | ICD-10-CM | POA: Diagnosis not present

## 2017-03-12 DIAGNOSIS — Z5181 Encounter for therapeutic drug level monitoring: Secondary | ICD-10-CM | POA: Diagnosis not present

## 2017-03-12 DIAGNOSIS — D6861 Antiphospholipid syndrome: Secondary | ICD-10-CM | POA: Diagnosis not present

## 2017-03-12 DIAGNOSIS — Z86718 Personal history of other venous thrombosis and embolism: Secondary | ICD-10-CM | POA: Diagnosis not present

## 2017-03-12 DIAGNOSIS — Z7901 Long term (current) use of anticoagulants: Secondary | ICD-10-CM | POA: Diagnosis not present

## 2017-03-24 DIAGNOSIS — D6861 Antiphospholipid syndrome: Secondary | ICD-10-CM | POA: Diagnosis not present

## 2017-03-24 DIAGNOSIS — Z7901 Long term (current) use of anticoagulants: Secondary | ICD-10-CM | POA: Diagnosis not present

## 2017-03-24 DIAGNOSIS — Z5181 Encounter for therapeutic drug level monitoring: Secondary | ICD-10-CM | POA: Diagnosis not present

## 2017-03-24 DIAGNOSIS — Z86718 Personal history of other venous thrombosis and embolism: Secondary | ICD-10-CM | POA: Diagnosis not present

## 2017-03-25 DIAGNOSIS — Z950 Presence of cardiac pacemaker: Secondary | ICD-10-CM | POA: Diagnosis not present

## 2017-03-27 DIAGNOSIS — F331 Major depressive disorder, recurrent, moderate: Secondary | ICD-10-CM | POA: Diagnosis not present

## 2017-03-27 DIAGNOSIS — D6861 Antiphospholipid syndrome: Secondary | ICD-10-CM | POA: Diagnosis not present

## 2017-03-27 DIAGNOSIS — Z8659 Personal history of other mental and behavioral disorders: Secondary | ICD-10-CM | POA: Diagnosis not present

## 2017-03-27 DIAGNOSIS — Z8679 Personal history of other diseases of the circulatory system: Secondary | ICD-10-CM | POA: Diagnosis not present

## 2017-03-27 DIAGNOSIS — C37 Malignant neoplasm of thymus: Secondary | ICD-10-CM | POA: Diagnosis not present

## 2017-03-27 DIAGNOSIS — G0490 Encephalitis and encephalomyelitis, unspecified: Secondary | ICD-10-CM | POA: Diagnosis not present

## 2017-03-28 DIAGNOSIS — J018 Other acute sinusitis: Secondary | ICD-10-CM | POA: Diagnosis not present

## 2017-04-07 DIAGNOSIS — C37 Malignant neoplasm of thymus: Secondary | ICD-10-CM | POA: Diagnosis not present

## 2017-04-07 DIAGNOSIS — Z5112 Encounter for antineoplastic immunotherapy: Secondary | ICD-10-CM | POA: Diagnosis not present

## 2017-04-07 DIAGNOSIS — G0481 Other encephalitis and encephalomyelitis: Secondary | ICD-10-CM | POA: Diagnosis not present

## 2017-04-07 DIAGNOSIS — G0490 Encephalitis and encephalomyelitis, unspecified: Secondary | ICD-10-CM | POA: Diagnosis not present

## 2017-04-09 DIAGNOSIS — Z9989 Dependence on other enabling machines and devices: Secondary | ICD-10-CM | POA: Diagnosis not present

## 2017-04-09 DIAGNOSIS — G4733 Obstructive sleep apnea (adult) (pediatric): Secondary | ICD-10-CM | POA: Diagnosis not present

## 2017-04-17 DIAGNOSIS — C37 Malignant neoplasm of thymus: Secondary | ICD-10-CM | POA: Diagnosis not present

## 2017-04-17 DIAGNOSIS — F331 Major depressive disorder, recurrent, moderate: Secondary | ICD-10-CM | POA: Diagnosis not present

## 2017-04-17 DIAGNOSIS — G9340 Encephalopathy, unspecified: Secondary | ICD-10-CM | POA: Diagnosis not present

## 2017-04-23 DIAGNOSIS — G0490 Encephalitis and encephalomyelitis, unspecified: Secondary | ICD-10-CM | POA: Diagnosis not present

## 2017-04-24 DIAGNOSIS — R791 Abnormal coagulation profile: Secondary | ICD-10-CM | POA: Diagnosis not present

## 2017-04-24 DIAGNOSIS — Z5181 Encounter for therapeutic drug level monitoring: Secondary | ICD-10-CM | POA: Diagnosis not present

## 2017-04-24 DIAGNOSIS — Z86718 Personal history of other venous thrombosis and embolism: Secondary | ICD-10-CM | POA: Diagnosis not present

## 2017-04-24 DIAGNOSIS — Z7901 Long term (current) use of anticoagulants: Secondary | ICD-10-CM | POA: Diagnosis not present

## 2017-04-24 DIAGNOSIS — D6861 Antiphospholipid syndrome: Secondary | ICD-10-CM | POA: Diagnosis not present

## 2017-04-25 DIAGNOSIS — Z5181 Encounter for therapeutic drug level monitoring: Secondary | ICD-10-CM | POA: Diagnosis not present

## 2017-04-25 DIAGNOSIS — Z86718 Personal history of other venous thrombosis and embolism: Secondary | ICD-10-CM | POA: Diagnosis not present

## 2017-04-25 DIAGNOSIS — Z7901 Long term (current) use of anticoagulants: Secondary | ICD-10-CM | POA: Diagnosis not present

## 2017-04-25 DIAGNOSIS — R791 Abnormal coagulation profile: Secondary | ICD-10-CM | POA: Diagnosis not present

## 2017-04-25 DIAGNOSIS — D6861 Antiphospholipid syndrome: Secondary | ICD-10-CM | POA: Diagnosis not present

## 2017-04-30 DIAGNOSIS — Z5181 Encounter for therapeutic drug level monitoring: Secondary | ICD-10-CM | POA: Diagnosis not present

## 2017-04-30 DIAGNOSIS — Z7901 Long term (current) use of anticoagulants: Secondary | ICD-10-CM | POA: Diagnosis not present

## 2017-04-30 DIAGNOSIS — Z86718 Personal history of other venous thrombosis and embolism: Secondary | ICD-10-CM | POA: Diagnosis not present

## 2017-04-30 DIAGNOSIS — D6861 Antiphospholipid syndrome: Secondary | ICD-10-CM | POA: Diagnosis not present

## 2017-04-30 DIAGNOSIS — R791 Abnormal coagulation profile: Secondary | ICD-10-CM | POA: Diagnosis not present

## 2017-05-06 DIAGNOSIS — Z86718 Personal history of other venous thrombosis and embolism: Secondary | ICD-10-CM | POA: Diagnosis not present

## 2017-05-06 DIAGNOSIS — Z5181 Encounter for therapeutic drug level monitoring: Secondary | ICD-10-CM | POA: Diagnosis not present

## 2017-05-06 DIAGNOSIS — D6861 Antiphospholipid syndrome: Secondary | ICD-10-CM | POA: Diagnosis not present

## 2017-05-06 DIAGNOSIS — Z7901 Long term (current) use of anticoagulants: Secondary | ICD-10-CM | POA: Diagnosis not present

## 2017-05-13 DIAGNOSIS — Z86718 Personal history of other venous thrombosis and embolism: Secondary | ICD-10-CM | POA: Diagnosis not present

## 2017-05-13 DIAGNOSIS — Z5181 Encounter for therapeutic drug level monitoring: Secondary | ICD-10-CM | POA: Diagnosis not present

## 2017-05-13 DIAGNOSIS — Z7901 Long term (current) use of anticoagulants: Secondary | ICD-10-CM | POA: Diagnosis not present

## 2017-05-13 DIAGNOSIS — D6861 Antiphospholipid syndrome: Secondary | ICD-10-CM | POA: Diagnosis not present

## 2017-05-20 DIAGNOSIS — C37 Malignant neoplasm of thymus: Secondary | ICD-10-CM | POA: Diagnosis not present

## 2017-05-20 DIAGNOSIS — G9340 Encephalopathy, unspecified: Secondary | ICD-10-CM | POA: Diagnosis not present

## 2017-05-20 DIAGNOSIS — F339 Major depressive disorder, recurrent, unspecified: Secondary | ICD-10-CM | POA: Diagnosis not present

## 2017-05-20 DIAGNOSIS — F331 Major depressive disorder, recurrent, moderate: Secondary | ICD-10-CM | POA: Diagnosis not present

## 2017-05-20 DIAGNOSIS — Z8679 Personal history of other diseases of the circulatory system: Secondary | ICD-10-CM | POA: Diagnosis not present

## 2017-06-05 DIAGNOSIS — D6861 Antiphospholipid syndrome: Secondary | ICD-10-CM | POA: Diagnosis not present

## 2017-06-05 DIAGNOSIS — Z7901 Long term (current) use of anticoagulants: Secondary | ICD-10-CM | POA: Diagnosis not present

## 2017-06-05 DIAGNOSIS — Z5181 Encounter for therapeutic drug level monitoring: Secondary | ICD-10-CM | POA: Diagnosis not present

## 2017-06-05 DIAGNOSIS — Z86718 Personal history of other venous thrombosis and embolism: Secondary | ICD-10-CM | POA: Diagnosis not present

## 2017-06-09 DIAGNOSIS — R0982 Postnasal drip: Secondary | ICD-10-CM | POA: Diagnosis not present

## 2017-06-09 DIAGNOSIS — R1010 Upper abdominal pain, unspecified: Secondary | ICD-10-CM | POA: Diagnosis not present

## 2017-06-09 DIAGNOSIS — H6983 Other specified disorders of Eustachian tube, bilateral: Secondary | ICD-10-CM | POA: Diagnosis not present

## 2017-06-09 DIAGNOSIS — Z23 Encounter for immunization: Secondary | ICD-10-CM | POA: Diagnosis not present

## 2017-06-10 DIAGNOSIS — F0789 Other personality and behavioral disorders due to known physiological condition: Secondary | ICD-10-CM | POA: Diagnosis not present

## 2017-06-10 DIAGNOSIS — G0481 Other encephalitis and encephalomyelitis: Secondary | ICD-10-CM | POA: Diagnosis not present

## 2017-06-10 DIAGNOSIS — F410 Panic disorder [episodic paroxysmal anxiety] without agoraphobia: Secondary | ICD-10-CM | POA: Diagnosis not present

## 2017-06-10 DIAGNOSIS — Z86718 Personal history of other venous thrombosis and embolism: Secondary | ICD-10-CM | POA: Diagnosis not present

## 2017-06-10 DIAGNOSIS — J9811 Atelectasis: Secondary | ICD-10-CM | POA: Diagnosis not present

## 2017-06-10 DIAGNOSIS — R5383 Other fatigue: Secondary | ICD-10-CM | POA: Diagnosis not present

## 2017-06-10 DIAGNOSIS — F09 Unspecified mental disorder due to known physiological condition: Secondary | ICD-10-CM | POA: Diagnosis not present

## 2017-06-10 DIAGNOSIS — I442 Atrioventricular block, complete: Secondary | ICD-10-CM | POA: Diagnosis not present

## 2017-06-10 DIAGNOSIS — D6861 Antiphospholipid syndrome: Secondary | ICD-10-CM | POA: Diagnosis not present

## 2017-06-10 DIAGNOSIS — Z7952 Long term (current) use of systemic steroids: Secondary | ICD-10-CM | POA: Diagnosis not present

## 2017-06-10 DIAGNOSIS — Z7901 Long term (current) use of anticoagulants: Secondary | ICD-10-CM | POA: Diagnosis not present

## 2017-06-10 DIAGNOSIS — F331 Major depressive disorder, recurrent, moderate: Secondary | ICD-10-CM | POA: Diagnosis not present

## 2017-06-10 DIAGNOSIS — C37 Malignant neoplasm of thymus: Secondary | ICD-10-CM | POA: Diagnosis not present

## 2017-06-10 DIAGNOSIS — E2740 Unspecified adrenocortical insufficiency: Secondary | ICD-10-CM | POA: Diagnosis not present

## 2017-06-10 DIAGNOSIS — Z9049 Acquired absence of other specified parts of digestive tract: Secondary | ICD-10-CM | POA: Diagnosis not present

## 2017-06-10 DIAGNOSIS — J8410 Pulmonary fibrosis, unspecified: Secondary | ICD-10-CM | POA: Diagnosis not present

## 2017-06-10 DIAGNOSIS — Z950 Presence of cardiac pacemaker: Secondary | ICD-10-CM | POA: Diagnosis not present

## 2017-06-10 DIAGNOSIS — G9340 Encephalopathy, unspecified: Secondary | ICD-10-CM | POA: Diagnosis not present

## 2017-06-10 DIAGNOSIS — R918 Other nonspecific abnormal finding of lung field: Secondary | ICD-10-CM | POA: Diagnosis not present

## 2017-06-10 DIAGNOSIS — Z923 Personal history of irradiation: Secondary | ICD-10-CM | POA: Diagnosis not present

## 2017-06-10 DIAGNOSIS — G0490 Encephalitis and encephalomyelitis, unspecified: Secondary | ICD-10-CM | POA: Diagnosis not present

## 2017-06-10 DIAGNOSIS — J90 Pleural effusion, not elsewhere classified: Secondary | ICD-10-CM | POA: Diagnosis not present

## 2017-06-16 DIAGNOSIS — I442 Atrioventricular block, complete: Secondary | ICD-10-CM | POA: Diagnosis not present

## 2017-06-16 DIAGNOSIS — Z923 Personal history of irradiation: Secondary | ICD-10-CM | POA: Diagnosis not present

## 2017-06-16 DIAGNOSIS — K219 Gastro-esophageal reflux disease without esophagitis: Secondary | ICD-10-CM | POA: Diagnosis not present

## 2017-06-16 DIAGNOSIS — Z79899 Other long term (current) drug therapy: Secondary | ICD-10-CM | POA: Diagnosis not present

## 2017-06-16 DIAGNOSIS — D6861 Antiphospholipid syndrome: Secondary | ICD-10-CM | POA: Diagnosis not present

## 2017-06-16 DIAGNOSIS — Z9049 Acquired absence of other specified parts of digestive tract: Secondary | ICD-10-CM | POA: Diagnosis not present

## 2017-06-16 DIAGNOSIS — Z86718 Personal history of other venous thrombosis and embolism: Secondary | ICD-10-CM | POA: Diagnosis not present

## 2017-06-16 DIAGNOSIS — G0490 Encephalitis and encephalomyelitis, unspecified: Secondary | ICD-10-CM | POA: Diagnosis not present

## 2017-06-16 DIAGNOSIS — K589 Irritable bowel syndrome without diarrhea: Secondary | ICD-10-CM | POA: Diagnosis not present

## 2017-06-16 DIAGNOSIS — Z950 Presence of cardiac pacemaker: Secondary | ICD-10-CM | POA: Diagnosis not present

## 2017-06-16 DIAGNOSIS — C801 Malignant (primary) neoplasm, unspecified: Secondary | ICD-10-CM | POA: Diagnosis not present

## 2017-06-16 DIAGNOSIS — J90 Pleural effusion, not elsewhere classified: Secondary | ICD-10-CM | POA: Diagnosis not present

## 2017-06-16 DIAGNOSIS — G4733 Obstructive sleep apnea (adult) (pediatric): Secondary | ICD-10-CM | POA: Diagnosis not present

## 2017-06-16 DIAGNOSIS — Z9889 Other specified postprocedural states: Secondary | ICD-10-CM | POA: Diagnosis not present

## 2017-06-16 DIAGNOSIS — C37 Malignant neoplasm of thymus: Secondary | ICD-10-CM | POA: Diagnosis not present

## 2017-06-16 DIAGNOSIS — E2749 Other adrenocortical insufficiency: Secondary | ICD-10-CM | POA: Diagnosis not present

## 2017-06-16 DIAGNOSIS — Z7901 Long term (current) use of anticoagulants: Secondary | ICD-10-CM | POA: Diagnosis not present

## 2017-06-16 DIAGNOSIS — Z808 Family history of malignant neoplasm of other organs or systems: Secondary | ICD-10-CM | POA: Diagnosis not present

## 2017-06-20 DIAGNOSIS — Z86718 Personal history of other venous thrombosis and embolism: Secondary | ICD-10-CM | POA: Diagnosis not present

## 2017-06-20 DIAGNOSIS — D6861 Antiphospholipid syndrome: Secondary | ICD-10-CM | POA: Diagnosis not present

## 2017-06-20 DIAGNOSIS — R791 Abnormal coagulation profile: Secondary | ICD-10-CM | POA: Diagnosis not present

## 2017-06-20 DIAGNOSIS — Z7901 Long term (current) use of anticoagulants: Secondary | ICD-10-CM | POA: Diagnosis not present

## 2017-06-20 DIAGNOSIS — Z5181 Encounter for therapeutic drug level monitoring: Secondary | ICD-10-CM | POA: Diagnosis not present

## 2017-06-24 DIAGNOSIS — D6861 Antiphospholipid syndrome: Secondary | ICD-10-CM | POA: Diagnosis not present

## 2017-06-24 DIAGNOSIS — Z86718 Personal history of other venous thrombosis and embolism: Secondary | ICD-10-CM | POA: Diagnosis not present

## 2017-06-24 DIAGNOSIS — Z5181 Encounter for therapeutic drug level monitoring: Secondary | ICD-10-CM | POA: Diagnosis not present

## 2017-06-24 DIAGNOSIS — Z7901 Long term (current) use of anticoagulants: Secondary | ICD-10-CM | POA: Diagnosis not present

## 2017-06-30 DIAGNOSIS — C37 Malignant neoplasm of thymus: Secondary | ICD-10-CM | POA: Diagnosis not present

## 2017-06-30 DIAGNOSIS — F331 Major depressive disorder, recurrent, moderate: Secondary | ICD-10-CM | POA: Diagnosis not present

## 2017-07-15 DIAGNOSIS — D6861 Antiphospholipid syndrome: Secondary | ICD-10-CM | POA: Diagnosis not present

## 2017-07-15 DIAGNOSIS — Z86718 Personal history of other venous thrombosis and embolism: Secondary | ICD-10-CM | POA: Diagnosis not present

## 2017-07-15 DIAGNOSIS — C37 Malignant neoplasm of thymus: Secondary | ICD-10-CM | POA: Diagnosis not present

## 2017-07-15 DIAGNOSIS — Z5181 Encounter for therapeutic drug level monitoring: Secondary | ICD-10-CM | POA: Diagnosis not present

## 2017-07-15 DIAGNOSIS — Z7901 Long term (current) use of anticoagulants: Secondary | ICD-10-CM | POA: Diagnosis not present

## 2017-07-17 DIAGNOSIS — C37 Malignant neoplasm of thymus: Secondary | ICD-10-CM | POA: Diagnosis not present

## 2017-07-17 DIAGNOSIS — D6861 Antiphospholipid syndrome: Secondary | ICD-10-CM | POA: Diagnosis not present

## 2017-07-17 DIAGNOSIS — F331 Major depressive disorder, recurrent, moderate: Secondary | ICD-10-CM | POA: Diagnosis not present

## 2017-07-17 DIAGNOSIS — G0481 Other encephalitis and encephalomyelitis: Secondary | ICD-10-CM | POA: Diagnosis not present

## 2017-07-30 DIAGNOSIS — Z950 Presence of cardiac pacemaker: Secondary | ICD-10-CM | POA: Diagnosis not present

## 2017-07-30 DIAGNOSIS — J90 Pleural effusion, not elsewhere classified: Secondary | ICD-10-CM | POA: Diagnosis not present

## 2017-07-30 DIAGNOSIS — R918 Other nonspecific abnormal finding of lung field: Secondary | ICD-10-CM | POA: Diagnosis not present

## 2017-07-30 DIAGNOSIS — Z85238 Personal history of other malignant neoplasm of thymus: Secondary | ICD-10-CM | POA: Diagnosis not present

## 2017-08-07 DIAGNOSIS — Z86718 Personal history of other venous thrombosis and embolism: Secondary | ICD-10-CM | POA: Diagnosis not present

## 2017-08-07 DIAGNOSIS — Z5181 Encounter for therapeutic drug level monitoring: Secondary | ICD-10-CM | POA: Diagnosis not present

## 2017-08-07 DIAGNOSIS — D6861 Antiphospholipid syndrome: Secondary | ICD-10-CM | POA: Diagnosis not present

## 2017-08-07 DIAGNOSIS — Z7901 Long term (current) use of anticoagulants: Secondary | ICD-10-CM | POA: Diagnosis not present

## 2017-08-26 DIAGNOSIS — C37 Malignant neoplasm of thymus: Secondary | ICD-10-CM | POA: Diagnosis not present

## 2017-08-26 DIAGNOSIS — F0789 Other personality and behavioral disorders due to known physiological condition: Secondary | ICD-10-CM | POA: Diagnosis not present

## 2017-08-26 DIAGNOSIS — J90 Pleural effusion, not elsewhere classified: Secondary | ICD-10-CM | POA: Diagnosis not present

## 2017-08-26 DIAGNOSIS — G9340 Encephalopathy, unspecified: Secondary | ICD-10-CM | POA: Diagnosis not present

## 2017-08-26 DIAGNOSIS — G4730 Sleep apnea, unspecified: Secondary | ICD-10-CM | POA: Diagnosis not present

## 2017-08-26 DIAGNOSIS — G0481 Other encephalitis and encephalomyelitis: Secondary | ICD-10-CM | POA: Diagnosis not present

## 2017-08-26 DIAGNOSIS — R918 Other nonspecific abnormal finding of lung field: Secondary | ICD-10-CM | POA: Diagnosis not present

## 2017-08-26 DIAGNOSIS — F331 Major depressive disorder, recurrent, moderate: Secondary | ICD-10-CM | POA: Diagnosis not present

## 2017-08-26 DIAGNOSIS — F09 Unspecified mental disorder due to known physiological condition: Secondary | ICD-10-CM | POA: Diagnosis not present

## 2017-08-28 ENCOUNTER — Encounter: Payer: Self-pay | Admitting: Family Medicine

## 2017-08-28 ENCOUNTER — Ambulatory Visit (INDEPENDENT_AMBULATORY_CARE_PROVIDER_SITE_OTHER): Payer: Medicare Other | Admitting: Family Medicine

## 2017-08-28 VITALS — BP 118/80 | HR 86 | Temp 98.4°F | Ht 72.0 in | Wt 237.2 lb

## 2017-08-28 DIAGNOSIS — E271 Primary adrenocortical insufficiency: Secondary | ICD-10-CM

## 2017-08-28 DIAGNOSIS — C37 Malignant neoplasm of thymus: Secondary | ICD-10-CM

## 2017-08-28 DIAGNOSIS — H9191 Unspecified hearing loss, right ear: Secondary | ICD-10-CM | POA: Diagnosis not present

## 2017-08-28 DIAGNOSIS — D6861 Antiphospholipid syndrome: Secondary | ICD-10-CM

## 2017-08-28 DIAGNOSIS — G0481 Other encephalitis and encephalomyelitis: Secondary | ICD-10-CM | POA: Diagnosis not present

## 2017-08-28 NOTE — Patient Instructions (Addendum)
If you do not hear anything about your referral in the next 1-2 weeks, call our office and ask for an update.  It may not be a bad idea to trial a nasal spray like Flonase or Nasonex if it is related to eustachian tube dysfunction.  Let us know if you need anything.

## 2017-08-28 NOTE — Progress Notes (Signed)
Chief Complaint  Patient presents with  . Establish Care       New Patient Visit SUBJECTIVE: HPI: Robert Kirby is an 66 y.o.male who is being seen for establishing care.  The patient is here with his wife, Freida Busman who I also see, to establish care.  He has an extensive history of rare medical conditions.  He follows with various specialties at [facility] for this.  Over the past year, the patient has been experiencing hearing loss in the right ear.  His wife thinks it is worsening.  His left ear is otherwise normal.  He was setting up a TV and a hammer fell right next to his right ear causing a loud noise.  Ever since then, the hearing in his ear has been progressively worsening.  There is otherwise no injury or change in medication.  The patient denies drainage or pain.  Does not describe it as ringing.  It is not pulsatile.  He uses Q-tips every once in a while.  He does not have a history of wax buildup.  No upper respiratory or allergy related symptoms.  Allergies  Allergen Reactions  . Tetracyclines & Related Nausea Only    Past Medical History:  Diagnosis Date  . 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 Antiphospholipid antibody syndrome ([place])   . Autoimmune encephalomyelitis   . 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 PACEMAKER PLACEMENT       Social History   Socioeconomic History  . Marital status: Married  Tobacco Use  . Smoking status: Never Smoker  . Smokeless tobacco: Never Used  Substance and Sexual Activity  . Alcohol use: Yes    Comment: socially  . Drug use: No   History reviewed. No pertinent family history.   Current Outpatient Medications:  .  busPIRone (BUSPAR) 7.5 MG tablet, Take 2 tablets in the am and 2 tablets in the pm, Disp: , Rfl:  .  carbamazepine (TEGRETOL) 200 MG tablet, Take 2 tablets in the am and 2 tablets in the pm, Disp: , Rfl:  .  escitalopram (LEXAPRO) 20 MG tablet, Take 20 mg by mouth daily., Disp: , Rfl:  .   hydrocortisone (CORTEF) 10 MG tablet, 2 tablets in the am and 1 tablet in the pm--Addison's Disease, Disp: , Rfl:  .  L-Methylfolate (DEPLIN PO), Take by mouth daily., Disp: , Rfl:  .  Lansoprazole (PREVACID PO), Take 20 mg by mouth daily., Disp: , Rfl:  .  memantine (NAMENDA) 10 MG tablet, Take 10 mg by mouth 2 (two) times daily., Disp: , Rfl:  .  mirtazapine (REMERON) 45 MG tablet, Take 45 mg by mouth at bedtime., Disp: , Rfl:  .  OLANZapine (ZYPREXA) 2.5 MG tablet, Take 2.5 mg by mouth at bedtime., Disp: , Rfl:  .  riTUXimab (RITUXAN) 100 MG/10ML injection, Inject into the vein every 6 (six) months., Disp: , Rfl:  .  valproic acid (DEPAKENE) 250 MG capsule, Take 3 capsules in the am and 3 capsules in the pm, Disp: , Rfl:  .  warfarin (COUMADIN) 5 MG tablet, As directed, Disp: , Rfl:    ROS Cardiovascular: Denies chest pain  HEENT: +R sided hearing loss   OBJECTIVE: BP 118/80 (BP Location: Left Arm, Patient Position: Sitting, Cuff Size: Large)   Pulse 86   Temp 98.4 F (36.9 C) (Oral)   Ht 6' (1.829 m)   Wt 237 lb 4 oz (107.6 kg)  SpO2 97%   BMI 32.18 kg/m   Constitutional: -  VS reviewed -  Well developed, well nourished, appears stated age -  No apparent distress  Psychiatric: -  Oriented to person, place, and time -  Memory intact -  Affect and mood normal -  Fluent conversation, good eye contact -  Judgment and insight age appropriate  Eye: -  Conjunctivae clear, no discharge -  Pupils symmetric, round, reactive to light  ENMT: -  MMM    Pharynx moist, no exudate, no erythema -No tenderness to external ears bilaterally, canals are patent bilaterally without erythema or discharge, TMs are clear bilaterally, no effusion, erythema, bulging or retraction  Neck: -  No gross swelling, no palpable masses -  Thyroid midline, not enlarged, mobile, no palpable masses  Cardiovascular: -  RRR -  No LE edema -  No bruits  Respiratory: -  Normal respiratory effort, no accessory  muscle use, no retraction -  Breath sounds equal, no wheezes, no ronchi, no crackles  Skin: -  No significant lesion on inspection -  Warm and dry to palpation   ASSESSMENT/PLAN: Hearing loss of right ear, unspecified hearing loss type - Plan: Ambulatory referral to Audiology  Autoimmune encephalomyelitis  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 OK to trial INCS if it is related to ETD.  Patient should return prn or for CPE. The patient voiced understanding and agreement to the plan.   [place], DO 08/28/17  11:51 AM

## 2017-08-28 NOTE — Progress Notes (Signed)
Pre visit review using our clinic review tool, if applicable. No additional management support is needed unless otherwise documented below in the visit note. 

## 2017-08-29 ENCOUNTER — Encounter: Payer: Self-pay | Admitting: Family Medicine

## 2017-08-29 DIAGNOSIS — F329 Major depressive disorder, single episode, unspecified: Secondary | ICD-10-CM | POA: Insufficient documentation

## 2017-08-29 DIAGNOSIS — R569 Unspecified convulsions: Secondary | ICD-10-CM | POA: Diagnosis not present

## 2017-08-29 DIAGNOSIS — G4733 Obstructive sleep apnea (adult) (pediatric): Secondary | ICD-10-CM | POA: Insufficient documentation

## 2017-08-29 DIAGNOSIS — Z9989 Dependence on other enabling machines and devices: Secondary | ICD-10-CM

## 2017-08-29 DIAGNOSIS — G0481 Other encephalitis and encephalomyelitis: Secondary | ICD-10-CM | POA: Diagnosis not present

## 2017-08-29 DIAGNOSIS — F32A Depression, unspecified: Secondary | ICD-10-CM | POA: Insufficient documentation

## 2017-09-02 DIAGNOSIS — Z5181 Encounter for therapeutic drug level monitoring: Secondary | ICD-10-CM | POA: Diagnosis not present

## 2017-09-02 DIAGNOSIS — Z7901 Long term (current) use of anticoagulants: Secondary | ICD-10-CM | POA: Diagnosis not present

## 2017-09-02 DIAGNOSIS — Z86718 Personal history of other venous thrombosis and embolism: Secondary | ICD-10-CM | POA: Diagnosis not present

## 2017-09-02 DIAGNOSIS — D6861 Antiphospholipid syndrome: Secondary | ICD-10-CM | POA: Diagnosis not present

## 2017-09-22 ENCOUNTER — Other Ambulatory Visit: Payer: Self-pay

## 2017-09-22 ENCOUNTER — Emergency Department (INDEPENDENT_AMBULATORY_CARE_PROVIDER_SITE_OTHER)
Admission: EM | Admit: 2017-09-22 | Discharge: 2017-09-22 | Disposition: A | Discharge status: Home / Self Care | Payer: Medicare Other | Source: Home / Self Care | Attending: Family Medicine | Admitting: Family Medicine

## 2017-09-22 DIAGNOSIS — B9789 Other viral agents as the cause of diseases classified elsewhere: Secondary | ICD-10-CM

## 2017-09-22 DIAGNOSIS — J069 Acute upper respiratory infection, unspecified: Secondary | ICD-10-CM | POA: Diagnosis not present

## 2017-09-22 MED ORDER — CEFDINIR 300 MG PO CAPS: 300.0000 mg | ORAL_CAPSULE | Freq: Two times a day (BID) | ORAL | 0 refills | Status: DC

## 2017-09-22 NOTE — ED Provider Notes (Signed)
Robert Kirby [facility]    CSN: [id number] Arrival date & time: 09/22/17  1208     History   Chief Complaint Chief Complaint  Patient presents with  . Cough    HPI Robert Kirby is a 66 y.o. male.   Patient has history of metastatic thymic CA, followed by pulmonologist and oncologist at [facility]. He complains of onset of a non-productive cough about 5 days ago after a family gathering.  He denies increased chest pain, shortness of breath, or fevers, chills, and sweats.  He has a follow-up appointment with his pulmonologist in two days.   The history is provided by the patient and a relative.    Past Medical History:  Diagnosis Date  . 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 Antiphospholipid antibody syndrome ([place])   . Arrhythmia    Pacemaker  . Autoimmune encephalomyelitis   . Depression   . OSA on CPAP   . 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 Depression   . OSA on CPAP   . Autoimmune encephalomyelitis   . 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 Antiphospholipid antibody syndrome ([place])   . 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 CHOLECYSTECTOMY  2000  . PACEMAKER PLACEMENT    . TONSILLECTOMY AND ADENOIDECTOMY  1959       Home Medications    Prior to Admission medications   Medication Sig Start Date End Date Taking? Authorizing Provider  busPIRone (BUSPAR) 7.5 MG tablet Take 2 tablets in the am and 2 tablets in the pm    [provider]  carbamazepine (TEGRETOL) 200 MG tablet Take 2 tablets in the am and 2 tablets in the pm    [provider]  cefdinir (OMNICEF) 300 MG capsule Take 1 capsule (300 mg total) by mouth 2 (two) times daily. 09/22/17   Kandra Nicolas, MD  escitalopram (LEXAPRO) 20 MG tablet Take 20 mg by mouth daily.    [provider]  hydrocortisone (CORTEF) 10 MG tablet 2 tablets in the am and 1 tablet in the pm--Addison's Disease    [provider]  L-Methylfolate (DEPLIN PO) Take by mouth daily.    [provider]  Lansoprazole (PREVACID PO) Take 20 mg by mouth daily.    [provider]  memantine (NAMENDA) 10 MG tablet Take 10 mg by mouth 2 (two) times daily.    [provider]  mirtazapine (REMERON) 45 MG tablet Take 45 mg by mouth at bedtime.    [provider]  OLANZapine (ZYPREXA) 2.5 MG tablet Take 2.5 mg by mouth at bedtime.    [provider]  riTUXimab (RITUXAN) 100 MG/10ML injection Inject into the vein every 6 (six) months.    [provider]  valproic acid (DEPAKENE) 250 MG capsule Take 3 capsules in the am and 3 capsules in the pm    [provider]  warfarin (COUMADIN) 5 MG tablet As directed    [provider]    Family History Family History  Problem Relation Age of Onset  . Depression Mother   . High blood pressure Daughter     Social History Social History   Tobacco Use  . Smoking status: Never Smoker  . Smokeless tobacco: Never Used  Substance Use Topics  . Alcohol use: Yes    Comment: socially  . Drug use: No  Allergies   Tetracyclines & related   Review of Systems Review of Systems ? sore throat + cough No pleuritic pain No wheezing + nasal congestion + post-nasal drainage No sinus pain/pressure No itchy/red eyes No earache No hemoptysis No SOB No fever/chills No nausea No vomiting No abdominal pain No diarrhea No urinary symptoms No skin rash + fatigue + myalgias and arthralgias No headache Used OTC meds without relief   Physical Exam Triage Vital Signs ED Triage Vitals  Enc Vitals Group     BP 09/22/17 1301 129/86     Pulse Rate 09/22/17 1301 96     Resp --      Temp 09/22/17 1301 98.7 F (37.1 C)     Temp Source 09/22/17 1301 Oral     SpO2 09/22/17 1301 94 %     Weight 09/22/17 1302 233 lb (105.7 kg)     Height 09/22/17 1302 6' (1.829 m)     Head Circumference --      Peak  Flow --      Pain Score 09/22/17 1302 0     Pain Loc --      Pain Edu? --      Excl. in [place]? --    No data found.  Updated Vital Signs BP 129/86 (BP Location: Right Arm)   Pulse 96   Temp 98.7 F (37.1 C) (Oral)   Ht 6' (1.829 m)   Wt 233 lb (105.7 kg)   SpO2 94%   BMI 31.60 kg/m   Visual Acuity Right Eye Distance:   Left Eye Distance:   Bilateral Distance:    Right Eye Near:   Left Eye Near:    Bilateral Near:     Physical Exam Nursing notes and Vital Signs reviewed. Appearance:  Patient appears stated age, and in no acute distress Eyes:  Pupils are equal, round, and reactive to light and accomodation.  Extraocular movement is intact.  Conjunctivae are not inflamed  Ears:  Canals normal.  Tympanic membranes normal.  Nose:  Mildly congested turbinates.  No sinus tenderness.  Pharynx:  Normal Neck:  Supple.  Enlarged posterior/lateral nodes are palpated bilaterally, tender to palpation on the left.   Lungs:  Clear to auscultation.  Breath sounds are equal.  Moving air well. Heart:  Regular rate and rhythm without murmurs, rubs, or gallops.  Abdomen:  Nontender without masses or hepatosplenomegaly.  Bowel sounds are present.  No CVA or flank tenderness.  Extremities:  No edema.  Skin:  No rash present.    UC Treatments / Results  Labs (all labs ordered are listed, but only abnormal results are displayed) Labs Reviewed - No data to display  EKG  EKG Interpretation None       Radiology No results found.  Procedures Procedures (including critical care time)  Medications Ordered in UC Medications - No data to display   Initial Impression / Assessment and Plan / UC Course  I have reviewed the triage vital signs and the nursing notes.  Pertinent labs & imaging results that were available during my care of the patient were reviewed by me and considered in my medical decision making (see chart for details).    Begin empiric Omnicef. Take plain guaifenesin  (1200mg  extended release tabs such as Mucinex) twice daily, with plenty of water, for cough and congestion.  May add Pseudoephedrine (30mg , one or two every 4 to 6 hours) for sinus congestion.  Get adequate rest.   May use Afrin nasal  spray (or generic oxymetazoline) each morning for about 5 days and then discontinue.  Also recommend using saline nasal spray several times daily and saline nasal irrigation (AYR is a common brand).  Use Flonase nasal spray each morning after using Afrin nasal spray and saline nasal irrigation. Try warm salt water gargles for sore throat.  Stop all antihistamines for now, and other non-prescription cough/cold preparations. May take Delsym Cough Suppressant at bedtime for nighttime cough.  Follow-up with family doctor if not improving about10 days.  Followup with pulmonologist in two days as scheduled.    Final Clinical Impressions(s) / UC Diagnoses   Final diagnoses:  Viral URI with cough    ED Discharge Orders        Ordered    cefdinir (OMNICEF) 300 MG capsule  2 times daily     09/22/17 1343          Kandra Nicolas, MD 09/23/17 1505

## 2017-09-22 NOTE — Discharge Instructions (Signed)
Take plain guaifenesin (1200mg  extended release tabs such as Mucinex) twice daily, with plenty of water, for cough and congestion.  May add Pseudoephedrine (30mg , one or two every 4 to 6 hours) for sinus congestion.  Get adequate rest.   May use Afrin nasal spray (or generic oxymetazoline) each morning for about 5 days and then discontinue.  Also recommend using saline nasal spray several times daily and saline nasal irrigation (AYR is a common brand).  Use Flonase nasal spray each morning after using Afrin nasal spray and saline nasal irrigation. Try warm salt water gargles for sore throat.  Stop all antihistamines for now, and other non-prescription cough/cold preparations. May take Delsym Cough Suppressant at bedtime for nighttime cough.  Follow-up with family doctor if not improving about10 days.

## 2017-09-22 NOTE — ED Triage Notes (Signed)
Pt has had a cold since before Christmas.  The last 2 nights, he has been coughing to the point he can not sleep.  His joints are achy and tired.  Took cough medicine with codeine last night, and did not help.

## 2017-09-23 ENCOUNTER — Telehealth: Payer: Self-pay | Admitting: Emergency Medicine

## 2017-09-23 NOTE — Telephone Encounter (Signed)
Patient states he is doing much better.

## 2017-09-24 DIAGNOSIS — C3491 Malignant neoplasm of unspecified part of right bronchus or lung: Secondary | ICD-10-CM | POA: Diagnosis not present

## 2017-09-24 DIAGNOSIS — G0481 Other encephalitis and encephalomyelitis: Secondary | ICD-10-CM | POA: Diagnosis not present

## 2017-09-29 ENCOUNTER — Encounter: Payer: Self-pay | Admitting: Family Medicine

## 2017-09-29 ENCOUNTER — Ambulatory Visit (INDEPENDENT_AMBULATORY_CARE_PROVIDER_SITE_OTHER): Payer: Medicare Other | Admitting: Family Medicine

## 2017-09-29 VITALS — BP 118/70 | HR 93 | Temp 98.6°F | Ht 72.0 in | Wt 232.2 lb

## 2017-09-29 DIAGNOSIS — J208 Acute bronchitis due to other specified organisms: Secondary | ICD-10-CM

## 2017-09-29 DIAGNOSIS — B9689 Other specified bacterial agents as the cause of diseases classified elsewhere: Secondary | ICD-10-CM | POA: Diagnosis not present

## 2017-09-29 MED ORDER — AZITHROMYCIN 250 MG PO TABS: ORAL_TABLET | ORAL | 0 refills | Status: DC

## 2017-09-29 MED ORDER — PROMETHAZINE-CODEINE 6.25-10 MG/5ML PO SYRP: 5.0000 mL | ORAL_SOLUTION | Freq: Four times a day (QID) | ORAL | 0 refills | Status: DC | PRN

## 2017-09-29 NOTE — Progress Notes (Signed)
Pre visit review using our clinic review tool, if applicable. No additional management support is needed unless otherwise documented below in the visit note. 

## 2017-09-29 NOTE — Patient Instructions (Addendum)
Continue to push fluids, practice good hand hygiene, and cover your mouth if you cough.  If you start having fevers, shaking or shortness of breath, seek immediate care.  Schedule an appt Friday if you aren't doing better.  Let us know if you need anything.

## 2017-09-29 NOTE — Progress Notes (Signed)
Chief Complaint  Patient presents with  . Cough  . Hoarse  . Fatigue    Kennedy Bucker here for URI complaints.  Duration: 2 weeks  Associated symptoms: sinus congestion, rhinorrhea, ear fullness, myalgias and cough Denies: sinus pain, itchy watery eyes, ear pain, ear drainage, sore throat, shortness of breath and fevers/rigors Treatment to date: [place], Delsum, Afrin, Mucinex  Sick contacts: Yes  ROS:  Const: Denies fevers HEENT: As noted in HPI Lungs: No SOB  Past Medical History:  Diagnosis Date  . 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 Antiphospholipid antibody syndrome ([place])   . Arrhythmia    Pacemaker  . Autoimmune encephalomyelitis   . Depression   . OSA on CPAP   . 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 Depression Mother   . High blood pressure Daughter     BP 118/70 (BP Location: Left Arm, Patient Position: Sitting, Cuff Size: Normal)   Pulse 93   Temp 98.6 F (37 C) (Oral)   Ht 6' (1.829 m)   Wt 232 lb 4 oz (105.3 kg)   SpO2 97%   BMI 31.50 kg/m  General: Awake, alert, appears stated age HEENT: AT, [place], ears patent b/l and TM's neg, nares patent w/o discharge, pharynx pink and without exudates, MMM Neck: No masses or asymmetry Heart: RRR Lungs: Coarse sounds in RLL, no accessory muscle use Psych: Age appropriate judgment and insight, normal mood and affect  Acute bacterial bronchitis - Plan: azithromycin (ZITHROMAX) 250 MG tablet, promethazine-codeine (PHENERGAN WITH CODEINE) 6.25-10 MG/5ML syrup  Orders as above. Continue to push fluids, practice good hand hygiene, cover mouth when coughing. F/u prn. If starting to experience fevers, shaking, or shortness of breath, seek immediate care. Pt voiced understanding and agreement to the plan.  [place], DO 09/29/17 4:14 PM

## 2017-09-30 DIAGNOSIS — G0481 Other encephalitis and encephalomyelitis: Secondary | ICD-10-CM | POA: Diagnosis not present

## 2017-09-30 DIAGNOSIS — F09 Unspecified mental disorder due to known physiological condition: Secondary | ICD-10-CM | POA: Diagnosis not present

## 2017-09-30 DIAGNOSIS — F0789 Other personality and behavioral disorders due to known physiological condition: Secondary | ICD-10-CM | POA: Diagnosis not present

## 2017-09-30 DIAGNOSIS — F331 Major depressive disorder, recurrent, moderate: Secondary | ICD-10-CM | POA: Diagnosis not present

## 2017-09-30 DIAGNOSIS — C37 Malignant neoplasm of thymus: Secondary | ICD-10-CM | POA: Diagnosis not present

## 2017-09-30 DIAGNOSIS — J90 Pleural effusion, not elsewhere classified: Secondary | ICD-10-CM | POA: Diagnosis not present

## 2017-09-30 DIAGNOSIS — G4730 Sleep apnea, unspecified: Secondary | ICD-10-CM | POA: Diagnosis not present

## 2017-10-03 DIAGNOSIS — Z950 Presence of cardiac pacemaker: Secondary | ICD-10-CM | POA: Diagnosis not present

## 2017-10-06 DIAGNOSIS — D6861 Antiphospholipid syndrome: Secondary | ICD-10-CM | POA: Diagnosis not present

## 2017-10-06 DIAGNOSIS — Z7901 Long term (current) use of anticoagulants: Secondary | ICD-10-CM | POA: Diagnosis not present

## 2017-10-06 DIAGNOSIS — Z5181 Encounter for therapeutic drug level monitoring: Secondary | ICD-10-CM | POA: Diagnosis not present

## 2017-10-06 DIAGNOSIS — Z86718 Personal history of other venous thrombosis and embolism: Secondary | ICD-10-CM | POA: Diagnosis not present

## 2017-10-06 DIAGNOSIS — C37 Malignant neoplasm of thymus: Secondary | ICD-10-CM | POA: Diagnosis not present

## 2017-10-10 DIAGNOSIS — G0490 Encephalitis and encephalomyelitis, unspecified: Secondary | ICD-10-CM | POA: Diagnosis not present

## 2017-10-15 DIAGNOSIS — J90 Pleural effusion, not elsewhere classified: Secondary | ICD-10-CM | POA: Diagnosis not present

## 2017-10-15 DIAGNOSIS — R931 Abnormal findings on diagnostic imaging of heart and coronary circulation: Secondary | ICD-10-CM | POA: Diagnosis not present

## 2017-10-15 DIAGNOSIS — G4733 Obstructive sleep apnea (adult) (pediatric): Secondary | ICD-10-CM | POA: Diagnosis not present

## 2017-10-15 DIAGNOSIS — C37 Malignant neoplasm of thymus: Secondary | ICD-10-CM | POA: Diagnosis not present

## 2017-10-16 ENCOUNTER — Telehealth: Payer: Self-pay | Admitting: Audiology

## 2017-10-16 NOTE — Telephone Encounter (Signed)
Wants earlier appt and to go to [facility] audiology and ENT.  Are interested in hearing aids. [facility] does not provide hearing aids. If that is primary reason for visit, follow-up with referent for a hearing aid evaluation and audiogram is recommended.  Jillien Yakel L. Heide Spark, Au.D., CCC-A Doctor of Audiology 10/16/2017

## 2017-11-04 DIAGNOSIS — R791 Abnormal coagulation profile: Secondary | ICD-10-CM | POA: Diagnosis not present

## 2017-11-04 DIAGNOSIS — Z86718 Personal history of other venous thrombosis and embolism: Secondary | ICD-10-CM | POA: Diagnosis not present

## 2017-11-04 DIAGNOSIS — C37 Malignant neoplasm of thymus: Secondary | ICD-10-CM | POA: Diagnosis not present

## 2017-11-04 DIAGNOSIS — Z5181 Encounter for therapeutic drug level monitoring: Secondary | ICD-10-CM | POA: Diagnosis not present

## 2017-11-04 DIAGNOSIS — D6861 Antiphospholipid syndrome: Secondary | ICD-10-CM | POA: Diagnosis not present

## 2017-11-04 DIAGNOSIS — Z7901 Long term (current) use of anticoagulants: Secondary | ICD-10-CM | POA: Diagnosis not present

## 2017-11-06 DIAGNOSIS — H903 Sensorineural hearing loss, bilateral: Secondary | ICD-10-CM | POA: Diagnosis not present

## 2017-11-06 DIAGNOSIS — H9311 Tinnitus, right ear: Secondary | ICD-10-CM | POA: Diagnosis not present

## 2017-11-10 ENCOUNTER — Encounter: Payer: Self-pay | Admitting: Family Medicine

## 2017-11-10 ENCOUNTER — Other Ambulatory Visit: Payer: Self-pay | Admitting: Family Medicine

## 2017-11-10 MED ORDER — HYDROCORTISONE 10 MG PO TABS: ORAL_TABLET | ORAL | 1 refills | Status: DC

## 2017-11-10 NOTE — Telephone Encounter (Signed)
Copied from [place]. Topic: Quick Communication - Rx Refill/Question >> Nov 10, 2017  9:52 AM Boyd Kerbs wrote:  Medication: hydrocortisone (CORTEF) 10 MG tablet  He has missed 2 dosages.     Has the patient contacted their pharmacy? No.   (Agent: If no, request that the patient contact the pharmacy for the refill.)   Preferred Pharmacy (with phone number or street name): [phone] - [place], [place] - [address] AT [place] [address] [place] [address] Phone: [phone] Fax: [phone]   Agent: Please be advised that RX refills may take up to 3 business days. We ask that you follow-up with your pharmacy.

## 2017-11-10 NOTE — Telephone Encounter (Signed)
I am happy to refill, but want to verify that another of his physicians isn't normally rx'ing this? TY.

## 2017-11-10 NOTE — Telephone Encounter (Signed)
Sent in 2 weeks to local and 3 months to mail order.

## 2017-11-10 NOTE — Telephone Encounter (Signed)
Hydrocortisone (Cortef) refill Last OV: 09/29/17 with no mention; new patient without CPE visit scheduled Last Refill:08/2017 by historical provider Pharmacy:[facility] [phone]

## 2017-11-10 NOTE — Addendum Note (Signed)
Addended by: Sharon Seller B on: 11/10/2017 01:17 PM   Modules accepted: Orders

## 2017-11-10 NOTE — Telephone Encounter (Signed)
No he does not, Ok to fill and will do so.

## 2017-11-11 DIAGNOSIS — F09 Unspecified mental disorder due to known physiological condition: Secondary | ICD-10-CM | POA: Diagnosis not present

## 2017-11-11 DIAGNOSIS — D6861 Antiphospholipid syndrome: Secondary | ICD-10-CM | POA: Diagnosis not present

## 2017-11-11 DIAGNOSIS — G9340 Encephalopathy, unspecified: Secondary | ICD-10-CM | POA: Diagnosis not present

## 2017-11-11 DIAGNOSIS — G4730 Sleep apnea, unspecified: Secondary | ICD-10-CM | POA: Diagnosis not present

## 2017-11-11 DIAGNOSIS — F0789 Other personality and behavioral disorders due to known physiological condition: Secondary | ICD-10-CM | POA: Diagnosis not present

## 2017-11-11 DIAGNOSIS — C37 Malignant neoplasm of thymus: Secondary | ICD-10-CM | POA: Diagnosis not present

## 2017-11-11 DIAGNOSIS — G0481 Other encephalitis and encephalomyelitis: Secondary | ICD-10-CM | POA: Diagnosis not present

## 2017-11-11 DIAGNOSIS — F410 Panic disorder [episodic paroxysmal anxiety] without agoraphobia: Secondary | ICD-10-CM | POA: Diagnosis not present

## 2017-11-11 DIAGNOSIS — F331 Major depressive disorder, recurrent, moderate: Secondary | ICD-10-CM | POA: Diagnosis not present

## 2017-11-12 MED ORDER — HYDROCORTISONE 10 MG PO TABS: ORAL_TABLET | ORAL | 1 refills | Status: DC

## 2017-11-17 DIAGNOSIS — Z86718 Personal history of other venous thrombosis and embolism: Secondary | ICD-10-CM | POA: Diagnosis not present

## 2017-11-17 DIAGNOSIS — Z7901 Long term (current) use of anticoagulants: Secondary | ICD-10-CM | POA: Diagnosis not present

## 2017-11-17 DIAGNOSIS — D6861 Antiphospholipid syndrome: Secondary | ICD-10-CM | POA: Diagnosis not present

## 2017-11-17 DIAGNOSIS — Z5181 Encounter for therapeutic drug level monitoring: Secondary | ICD-10-CM | POA: Diagnosis not present

## 2017-11-23 ENCOUNTER — Encounter: Payer: Self-pay | Admitting: Family Medicine

## 2017-11-24 MED ORDER — LANSOPRAZOLE 15 MG PO CPDR: 20.0000 mg | DELAYED_RELEASE_CAPSULE | Freq: Every day | ORAL | 1 refills | Status: DC

## 2017-11-25 NOTE — Progress Notes (Signed)
Subjective:   Robert Kirby is a 67 y.o. male who presents for an Initial Medicare Annual Wellness Visit. The Patient was informed that the wellness visit is to identify future health risk and educate and initiate measures that can reduce risk for increased disease through the lifespan.   Review of Systems No ROS.  Medicare Wellness Visit. Additional risk factors are reflected in the social history. Cardiac Risk Factors include: advanced age (>60men, >33 women);male gender;obesity (BMI >30kg/m2) Sleep patterns: Wears CPAP. Sleeps 8-9 hrs. Does not wake to urinate.  Home Safety/Smoke Alarms: Feels safe in home. Smoke alarms in place.  Living environment; residence and Firearm Safety: No stairs. Lives with wife and puppy.  Seat Belt Safety/Bike Helmet: Wears seat belt.   Male:   CCS- last reported 2016 per wife-normal PSA- No results found for: PSA     Objective:    Today's Vitals   11/27/17 1200  BP: 138/80  Pulse: 86  SpO2: 96%  Weight: 236 lb (107 kg)  Height: 6' (1.829 m)   Body mass index is 32.01 kg/m.  Advanced Directives 11/27/2017  Does Patient Have a Medical Advance Directive? Yes  Type of Paramedic of [place];Living will  Does patient want to make changes to medical advance directive? No - Patient declined  Copy of [place] in Chart? No - copy requested    Current Medications (verified) Outpatient Encounter Medications as of 11/27/2017  Medication Sig  . busPIRone (BUSPAR) 7.5 MG tablet Take 2 tablets in the am and 2 tablets in the pm  . carbamazepine (TEGRETOL) 200 MG tablet Take 2 tablets in the am and 2 tablets in the pm  . escitalopram (LEXAPRO) 20 MG tablet Take 20 mg by mouth daily.  . hydrocortisone (CORTEF) 10 MG tablet 2 tablets in the am and 1 tablet in the pm--Addison's Disease  . L-Methylfolate (DEPLIN PO) Take by mouth daily.  . lansoprazole (PREVACID) 15 MG capsule Take 1 capsule (15 mg total) by mouth  daily.  . memantine (NAMENDA) 10 MG tablet Take 10 mg by mouth 2 (two) times daily.  . mirtazapine (REMERON) 45 MG tablet Take 45 mg by mouth at bedtime.  . riTUXimab (RITUXAN) 100 MG/10ML injection Inject into the vein every 6 (six) months.  . valproic acid (DEPAKENE) 250 MG capsule Take 3 capsules in the am and 3 capsules in the pm  . warfarin (COUMADIN) 5 MG tablet As directed  . [DISCONTINUED] azithromycin (ZITHROMAX) 250 MG tablet Take 2 tabs the first day and then 1 tab daily until you run out. (Patient not taking: Reported on 11/27/2017)  . [DISCONTINUED] cefdinir (OMNICEF) 300 MG capsule Take 1 capsule (300 mg total) by mouth 2 (two) times daily.  . [DISCONTINUED] OLANZapine (ZYPREXA) 2.5 MG tablet Take 2.5 mg by mouth at bedtime.  . [DISCONTINUED] promethazine-codeine (PHENERGAN WITH CODEINE) 6.25-10 MG/5ML syrup Take 5 mLs by mouth every 6 (six) hours as needed for cough.   No facility-administered encounter medications on file as of 11/27/2017.     Allergies (verified) Tetracyclines & related   History: Past Medical History:  Diagnosis Date  . Addison's disease ([place])   . Antiphospholipid antibody syndrome ([place])   . Arrhythmia    Pacemaker  . Autoimmune encephalomyelitis   . Depression   . OSA on CPAP   . 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 CHOLECYSTECTOMY  2000  . PACEMAKER PLACEMENT    .  TONSILLECTOMY AND ADENOIDECTOMY  1959   Family History  Problem Relation Age of Onset  . Depression Mother   . High blood pressure Daughter    Social History   Socioeconomic History  . Marital status: Married    Spouse name: None  . Number of children: None  . Years of education: None  . Highest education level: None  Social Needs  . Financial resource strain: None  . Food insecurity - worry: None  . Food insecurity - inability: None  . Transportation needs - medical: None  . Transportation needs - non-medical: None  Occupational  History  . None  Tobacco Use  . Smoking status: Never Smoker  . Smokeless tobacco: Never Used  Substance and Sexual Activity  . Alcohol use: Yes    Comment: socially  . Drug use: No  . Sexual activity: No  Other Topics Concern  . None  Social History Narrative  . None   Tobacco Counseling Counseling given: Not Answered   Clinical Intake: Pain : No/denies pain  Activities of Daily Living In your present state of health, do you have any difficulty performing the following activities: 11/27/2017  Hearing? N  Vision? N  Comment Last appt 05/2017. [facility] yearly.   Difficulty concentrating or making decisions? N  Walking or climbing stairs? N  Dressing or bathing? N  Doing errands, shopping? N  Comment uses [facility] due to short term memory loss.  Preparing Food and eating ? N  Using the Toilet? N  In the past six months, have you accidently leaked urine? N  Do you have problems with loss of bowel control? N  Managing your Medications? N  Managing your Finances? N  Housekeeping or managing your Housekeeping? N     Immunizations and Health Maintenance  There is no immunization history on file for this patient. Health Maintenance Due  Topic Date Due  . Hepatitis C Screening  Mar 02, 1951  . TETANUS/TDAP  01/08/1970  . COLONOSCOPY  01/08/2001  . PNA vac Low Risk Adult (1 of 2 - PCV13) 01/09/2016    Patient Care Team: Shelda Pal, DO as PCP - General (Family Medicine)  Indicate any recent Medical Services you may have received from other than [facility] providers in the past year (date may be approximate).    Assessment:   This is a routine wellness examination for Robert Kirby. Physical assessment deferred to PCP.  Hearing/Vision screen  Visual Acuity Screening   Right eye Left eye Both eyes  Without correction:     With correction: 20/20 20/20 20/20   Hearing Screening Comments: Able to hear conversational tones w/o difficulty. No issues reported.    Passed whisper test   Dietary issues and exercise activities discussed: Current Exercise Habits: Home exercise routine, Type of exercise: walking, Time (Minutes): 30, Frequency (Times/Week): 2, Weekly Exercise (Minutes/Week): 60, Intensity: Mild Diet (meal preparation, eat out, water intake, caffeinated beverages, dairy products, fruits and vegetables): in general, a "healthy" diet  , well balanced Breakfast:cereal Lunch:sanwich or soup  Dinner: meat and vegetable     Goals    . Patient Stated     Reports he would like to be more positive and grow stronger emotionally.      Depression Screen PHQ 2/9 Scores 11/27/2017  PHQ - 2 Score 4  PHQ- 9 Score 4    Fall Risk Fall Risk  11/27/2017  Falls in the past year? No     Cognitive Function: MMSE - Mini  Mental State Exam 11/27/2017  Orientation to time 5  Orientation to Place 5  Registration 3  Attention/ Calculation 5  Recall 3  Language- name 2 objects 2  Language- repeat 1  Language- follow 3 step command 3  Language- read & follow direction 1  Write a sentence 1  Copy design 1  Total score 30        Screening Tests Health Maintenance  Topic Date Due  . Hepatitis C Screening  14-Jul-1951  . TETANUS/TDAP  01/08/1970  . COLONOSCOPY  01/08/2001  . PNA vac Low Risk Adult (1 of 2 - PCV13) 01/09/2016  . INFLUENZA VACCINE  Completed      Plan:   Follow up with PCP as directed.  Continue to eat heart healthy diet (full of fruits, vegetables, whole grains, lean protein, water--limit salt, fat, and sugar intake) and increase physical activity as tolerated.  Continue doing brain stimulating activities (puzzles, reading, adult coloring books, staying active) to keep memory sharp.   Bring a copy of your living will and/or healthcare power of attorney to your next office visit.   I have personally reviewed and noted the following in the patient's chart:   . Medical and social history . Use of alcohol, tobacco or illicit  drugs  . Current medications and supplements . Functional ability and status . Nutritional status . Physical activity . Advanced directives . List of other physicians . Hospitalizations, surgeries, and ER visits in previous 12 months . Vitals . Screenings to include cognitive, depression, and falls . Referrals and appointments  In addition, I have reviewed and discussed with patient certain preventive protocols, quality metrics, and best practice recommendations. A written personalized care plan for preventive services as well as general preventive health recommendations were provided to patient.     Shela Nevin, [place]   11/27/2017

## 2017-11-27 ENCOUNTER — Ambulatory Visit (INDEPENDENT_AMBULATORY_CARE_PROVIDER_SITE_OTHER): Payer: Medicare Other | Admitting: *Deleted

## 2017-11-27 ENCOUNTER — Encounter: Payer: Self-pay | Admitting: *Deleted

## 2017-11-27 VITALS — BP 138/80 | HR 86 | Ht 72.0 in | Wt 236.0 lb

## 2017-11-27 DIAGNOSIS — Z0000 Encounter for general adult medical examination without abnormal findings: Secondary | ICD-10-CM | POA: Diagnosis not present

## 2017-11-27 NOTE — Patient Instructions (Signed)
Continue to eat heart healthy diet (full of fruits, vegetables, whole grains, lean protein, water--limit salt, fat, and sugar intake) and increase physical activity as tolerated.  Continue doing brain stimulating activities (puzzles, reading, adult coloring books, staying active) to keep memory sharp.   Bring a copy of your living will and/or healthcare power of attorney to your next office visit.   Robert Kirby , Thank you for taking time to come for your Medicare Wellness Visit. I appreciate your ongoing commitment to your health goals. Please review the following plan we discussed and let me know if I can assist you in the future.   These are the goals we discussed: Goals    . Patient Stated     Reports he would like to be more positive and grow stronger emotionally.       This is a list of the screening recommended for you and due dates:  Health Maintenance  Topic Date Due  .  Hepatitis C: One time screening is recommended by Center for Disease Control  (CDC) for  adults born from [birth year] through 1965.   [birth year]  . Tetanus Vaccine  01/08/1970  . Colon Cancer Screening  01/08/2001  . Pneumonia vaccines (1 of 2 - PCV13) 01/09/2016  . Flu Shot  Completed    Health Maintenance, Male A healthy lifestyle and preventive care is important for your health and wellness. Ask your health care provider about what schedule of regular examinations is right for you. What should I know about weight and diet? Eat a Healthy Diet  Eat plenty of vegetables, fruits, whole grains, low-fat dairy products, and lean protein.  Do not eat a lot of foods high in solid fats, added sugars, or salt.  Maintain a Healthy Weight Regular exercise can help you achieve or maintain a healthy weight. You should:  Do at least 150 minutes of exercise each week. The exercise should increase your heart rate and make you sweat (moderate-intensity exercise).  Do strength-training exercises at least twice a  week.  Watch Your Levels of Cholesterol and Blood Lipids  Have your blood tested for lipids and cholesterol every 5 years starting at 67 years of age. If you are at high risk for heart disease, you should start having your blood tested when you are 67 years old. You may need to have your cholesterol levels checked more often if: ? Your lipid or cholesterol levels are high. ? You are older than 66 years of age. ? You are at high risk for heart disease.  What should I know about cancer screening? Many types of cancers can be detected early and may often be prevented. Lung Cancer  You should be screened every year for lung cancer if: ? You are a current smoker who has smoked for at least 30 years. ? You are a former smoker who has quit within the past 15 years.  Talk to your health care provider about your screening options, when you should start screening, and how often you should be screened.  Colorectal Cancer  Routine colorectal cancer screening usually begins at 67 years of age and should be repeated every 5-10 years until you are 67 years old. You may need to be screened more often if early forms of precancerous polyps or small growths are found. Your health care provider may recommend screening at an earlier age if you have risk factors for colon cancer.  Your health care provider may recommend using home test kits to check for  hidden blood in the stool.  A small camera at the end of a tube can be used to examine your colon (sigmoidoscopy or colonoscopy). This checks for the earliest forms of colorectal cancer.  Prostate and Testicular Cancer  Depending on your age and overall health, your health care provider may do certain tests to screen for prostate and testicular cancer.  Talk to your health care provider about any symptoms or concerns you have about testicular or prostate cancer.  Skin Cancer  Check your skin from head to toe regularly.  Tell your health care provider  about any new moles or changes in moles, especially if: ? There is a change in a mole's size, shape, or color. ? You have a mole that is larger than a pencil eraser.  Always use sunscreen. Apply sunscreen liberally and repeat throughout the day.  Protect yourself by wearing long sleeves, pants, a wide-brimmed hat, and sunglasses when outside.  What should I know about heart disease, diabetes, and high blood pressure?  If you are 90-38 years of age, have your blood pressure checked every 3-5 years. If you are 21 years of age or older, have your blood pressure checked every year. You should have your blood pressure measured twice-once when you are at a hospital or clinic, and once when you are not at a hospital or clinic. Record the average of the two measurements. To check your blood pressure when you are not at a hospital or clinic, you can use: ? An automated blood pressure machine at a pharmacy. ? A home blood pressure monitor.  Talk to your health care provider about your target blood pressure.  If you are between 72-26 years old, ask your health care provider if you should take aspirin to prevent heart disease.  Have regular diabetes screenings by checking your fasting blood sugar level. ? If you are at a normal weight and have a low risk for diabetes, have this test once every three years after the age of 19. ? If you are overweight and have a high risk for diabetes, consider being tested at a younger age or more often.  A one-time screening for abdominal aortic aneurysm (AAA) by ultrasound is recommended for men aged 53-75 years who are current or former smokers. What should I know about preventing infection? Hepatitis B If you have a higher risk for hepatitis B, you should be screened for this virus. Talk with your health care provider to find out if you are at risk for hepatitis B infection. Hepatitis C Blood testing is recommended for:  Everyone born from [birth year] through  1965.  Anyone with known risk factors for hepatitis C.  Sexually Transmitted Diseases (STDs)  You should be screened each year for STDs including gonorrhea and chlamydia if: ? You are sexually active and are younger than 67 years of age. ? You are older than 67 years of age and your health care provider tells you that you are at risk for this type of infection. ? Your sexual activity has changed since you were last screened and you are at an increased risk for chlamydia or gonorrhea. Ask your health care provider if you are at risk.  Talk with your health care provider about whether you are at high risk of being infected with HIV. Your health care provider may recommend a prescription medicine to help prevent HIV infection.  What else can I do?  Schedule regular health, dental, and eye exams.  Stay current with your  vaccines (immunizations).  Do not use any tobacco products, such as cigarettes, chewing tobacco, and e-cigarettes. If you need help quitting, ask your health care provider.  Limit alcohol intake to no more than 2 drinks per day. One drink equals 12 ounces of beer, 5 ounces of wine, or 1 ounces of hard liquor.  Do not use street drugs.  Do not share needles.  Ask your health care provider for help if you need support or information about quitting drugs.  Tell your health care provider if you often feel depressed.  Tell your health care provider if you have ever been abused or do not feel safe at home. This information is not intended to replace advice given to you by your health care provider. Make sure you discuss any questions you have with your health care provider. Document Released: 03/07/2008 Document Revised: 05/08/2016 Document Reviewed: 06/13/2015 Elsevier Interactive Patient Education  [employer].

## 2017-11-27 NOTE — Progress Notes (Signed)
Noted. Agree with above.  [place], DO 11/27/17 2:11 PM

## 2017-12-05 ENCOUNTER — Ambulatory Visit (INDEPENDENT_AMBULATORY_CARE_PROVIDER_SITE_OTHER): Payer: Medicare Other | Admitting: Family Medicine

## 2017-12-05 ENCOUNTER — Encounter: Payer: Self-pay | Admitting: Family Medicine

## 2017-12-05 VITALS — BP 120/86 | HR 96 | Temp 98.2°F | Ht 72.0 in | Wt 231.4 lb

## 2017-12-05 DIAGNOSIS — J0100 Acute maxillary sinusitis, unspecified: Secondary | ICD-10-CM | POA: Diagnosis not present

## 2017-12-05 MED ORDER — AMOXICILLIN-POT CLAVULANATE 875-125 MG PO TABS: 1.0000 | ORAL_TABLET | Freq: Two times a day (BID) | ORAL | 0 refills | Status: DC

## 2017-12-05 MED ORDER — METHYLPREDNISOLONE ACETATE 80 MG/ML IJ SUSP
80.0000 mg | Freq: Once | INTRAMUSCULAR | Status: AC
  Administered 2017-12-05: 80 mg via INTRAMUSCULAR

## 2017-12-05 NOTE — Progress Notes (Signed)
Chief Complaint  Patient presents with  . Sinusitis    Robert Kirby here for URI complaints.  Duration: 4 days  Associated symptoms: sinus congestion, sinus pain, rhinorrhea, ear fullness and cough (from drainage) Denies: itchy watery eyes, ear pain, ear drainage, sore throat, wheezing, shortness of breath, myalgia and fevers Treatment to date: INCS Sick contacts: No  ROS:  Const: Denies fevers HEENT: As noted in HPI Lungs: No SOB  Past Medical History:  Diagnosis Date  . 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 Antiphospholipid antibody syndrome ([place])   . Arrhythmia    Pacemaker  . Autoimmune encephalomyelitis   . Depression   . OSA on CPAP   . 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 Depression Mother   . High blood pressure Daughter     BP 120/86 (BP Location: Left Arm, Patient Position: Sitting, Cuff Size: Large)   Pulse 96   Temp 98.2 F (36.8 C) (Oral)   Ht 6' (1.829 m)   Wt 231 lb 6 oz (105 kg)   SpO2 95%   BMI 31.38 kg/m  General: Awake, alert, appears stated age HEENT: AT, [place], ears patent b/l and TM's neg, +mild ttp over max sinuses b/l, nares patent w/o discharge, pharynx pink and without exudates, MMM Neck: No masses or asymmetry Heart: RRR Lungs: CTAB, no accessory muscle use Psych: Age appropriate judgment and insight, normal mood and affect  Acute maxillary sinusitis, recurrence not specified - Plan: amoxicillin-clavulanate (AUGMENTIN) 875-125 MG tablet  Orders as above. Pocket rx given, steroid injection for s/s's.  Continue to push fluids, practice good hand hygiene, cover mouth when coughing. F/u prn. If starting to experience fevers, shaking, or shortness of breath, seek immediate care. Pt voiced understanding and agreement to the plan.  [place], DO 12/05/17 11:26 AM

## 2017-12-05 NOTE — Patient Instructions (Signed)
Most sinus infections are viral in etiology and antibiotics will not be helpful. That being said, if you start having worsening symptoms over 3 days, you are worsening by day 10 or not improving by day 14, go ahead and take it. You are on Day 4 as of now.   Continue to push fluids, practice good hand hygiene, and cover your mouth if you cough.  If you start having fevers, shaking or shortness of breath, seek immediate care.  If you start having fevers, take antibiotic or seek care.   OK to take Tylenol 1000 mg (2 extra strength tabs) or 975 mg (3 regular strength tabs) every 6 hours as needed.  Let us know if you need anything.

## 2017-12-05 NOTE — Addendum Note (Signed)
Addended by: Sharon Seller B on: 12/05/2017 11:33 AM   Modules accepted: Orders

## 2017-12-05 NOTE — Progress Notes (Signed)
Pre visit review using our clinic review tool, if applicable. No additional management support is needed unless otherwise documented below in the visit note. 

## 2017-12-08 DIAGNOSIS — G4733 Obstructive sleep apnea (adult) (pediatric): Secondary | ICD-10-CM | POA: Diagnosis not present

## 2017-12-08 DIAGNOSIS — G4752 REM sleep behavior disorder: Secondary | ICD-10-CM | POA: Diagnosis not present

## 2017-12-09 ENCOUNTER — Ambulatory Visit: Payer: Medicare Other | Admitting: Audiology

## 2017-12-11 DIAGNOSIS — Z86718 Personal history of other venous thrombosis and embolism: Secondary | ICD-10-CM | POA: Diagnosis not present

## 2017-12-11 DIAGNOSIS — D6861 Antiphospholipid syndrome: Secondary | ICD-10-CM | POA: Diagnosis not present

## 2017-12-11 DIAGNOSIS — Z7901 Long term (current) use of anticoagulants: Secondary | ICD-10-CM | POA: Diagnosis not present

## 2017-12-11 DIAGNOSIS — Z5181 Encounter for therapeutic drug level monitoring: Secondary | ICD-10-CM | POA: Diagnosis not present

## 2017-12-19 ENCOUNTER — Encounter: Payer: Self-pay | Admitting: Family Medicine

## 2017-12-19 ENCOUNTER — Ambulatory Visit (INDEPENDENT_AMBULATORY_CARE_PROVIDER_SITE_OTHER): Payer: Medicare Other | Admitting: Family Medicine

## 2017-12-19 VITALS — BP 112/76 | HR 87 | Temp 98.7°F | Ht 72.0 in | Wt 229.0 lb

## 2017-12-19 DIAGNOSIS — R339 Retention of urine, unspecified: Secondary | ICD-10-CM | POA: Diagnosis not present

## 2017-12-19 DIAGNOSIS — J301 Allergic rhinitis due to pollen: Secondary | ICD-10-CM | POA: Insufficient documentation

## 2017-12-19 LAB — URINALYSIS, ROUTINE W REFLEX MICROSCOPIC
BILIRUBIN URINE: NEGATIVE
LEUKOCYTES UA: NEGATIVE
Nitrite: NEGATIVE
Specific Gravity, Urine: 1.025 (ref 1.000–1.030)
Total Protein, Urine: NEGATIVE
UROBILINOGEN UA: 0.2 (ref 0.0–1.0)
Urine Glucose: NEGATIVE
pH: 6 (ref 5.0–8.0)

## 2017-12-19 LAB — POC URINALSYSI DIPSTICK (AUTOMATED)
BILIRUBIN UA: NEGATIVE
GLUCOSE UA: NEGATIVE
KETONES UA: NEGATIVE
Leukocytes, UA: NEGATIVE
NITRITE UA: NEGATIVE
Protein, UA: NEGATIVE
Spec Grav, UA: >=1.03 — AB (ref 1.010–1.025)
Urobilinogen, UA: 0.2 E.U./dL
pH, UA: 6 (ref 5.0–8.0)

## 2017-12-19 MED ORDER — LEVOCETIRIZINE DIHYDROCHLORIDE 5 MG PO TABS: 5.0000 mg | ORAL_TABLET | Freq: Every evening | ORAL | 3 refills | Status: DC

## 2017-12-19 NOTE — Progress Notes (Signed)
Pre visit review using our clinic review tool, if applicable. No additional management support is needed unless otherwise documented below in the visit note. 

## 2017-12-19 NOTE — Progress Notes (Signed)
Chief Complaint  Patient presents with  . Cough    Kennedy Bucker here for URI complaints.  Duration: 2 weeks  Associated symptoms: itchy watery eyes and cough Denies: sinus congestion, sinus pain, rhinorrhea, ear pain, ear drainage, sore throat, wheezing, shortness of breath, myalgia and fevers Treatment to date: cough syrup Sick contacts: No   Has also been having urinary retention issues. Started after he began with a cough syrup.   ROS:  Const: Denies fevers HEENT: As noted in HPI Lungs: No SOB  Past Medical History:  Diagnosis Date  . Addison's disease ([place])   . Antiphospholipid antibody syndrome ([place])   . Arrhythmia    Pacemaker  . Autoimmune encephalomyelitis   . Depression   . OSA on CPAP   . 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[facility])    BP 112/76 (BP Location: Left Arm, Patient Position: Sitting, Cuff Size: Large)   Pulse 87   Temp 98.7 F (37.1 C) (Oral)   Ht 6' (1.829 m)   Wt 229 lb (103.9 kg)   SpO2 97%   BMI 31.06 kg/m  General: Awake, alert, appears stated age HEENT: AT, [place], ears patent b/l and TM's neg, nares patent w/o discharge, pharynx pink and without exudates, MMM Neck: No masses or asymmetry Heart: RRR Lungs: CTAB, no accessory muscle use Psych: Age appropriate judgment and insight, normal mood and affect  Seasonal allergic rhinitis due to pollen - Plan: levocetirizine (XYZAL) 5 MG tablet  Urinary retention - Plan: POCT Urinalysis Dipstick (Automated), Urinalysis, Routine w reflex microscopic  Orders as above. Start PO antihistamine. INCS if no improvement.  I think his retention issue is related to AE's of the cough syrup his son gave him. Will see if it improves. There was some blood in his urine dip, will send for microscopy. May have him repeat pending results.  Continue to push fluids, practice good hand hygiene, cover mouth when coughing. F/u prn. If starting to experience fevers, shaking, or shortness of breath, seek immediate care. Pt voiced understanding  and agreement to the plan.  [place], DO 12/19/17 1:16 PM

## 2017-12-19 NOTE — Patient Instructions (Signed)
The urinary issue is likely due to a side effect of the cough syrup. Stop the syrup and see how things go. If they do not improve over the next 2 weeks, I would like to see you.  Your urine has some blood in it. We are going to send it for further study and will keep you appraised. We may have you return to leave another urine sample.  Claritin (loratadine), Allegra (fexofenadine), Zyrtec (cetirizine); these are listed in order from weakest to strongest. Generic, and therefore cheaper, options are in the parentheses.   Flonase (fluticasone); nasal spray that is over the counter. 2 sprays each nostril, once daily. Aim towards the same side eye when you spray.  There are available OTC, and the generic versions, which may be cheaper, are in parentheses. Show this to a pharmacist if you have trouble finding any of these items.

## 2017-12-22 ENCOUNTER — Other Ambulatory Visit: Payer: Self-pay | Admitting: Family Medicine

## 2017-12-22 DIAGNOSIS — R319 Hematuria, unspecified: Secondary | ICD-10-CM

## 2017-12-25 DIAGNOSIS — F09 Unspecified mental disorder due to known physiological condition: Secondary | ICD-10-CM | POA: Diagnosis not present

## 2017-12-25 DIAGNOSIS — D6861 Antiphospholipid syndrome: Secondary | ICD-10-CM | POA: Diagnosis not present

## 2017-12-25 DIAGNOSIS — Z8679 Personal history of other diseases of the circulatory system: Secondary | ICD-10-CM | POA: Diagnosis not present

## 2017-12-25 DIAGNOSIS — G0481 Other encephalitis and encephalomyelitis: Secondary | ICD-10-CM | POA: Diagnosis not present

## 2017-12-25 DIAGNOSIS — G0490 Encephalitis and encephalomyelitis, unspecified: Secondary | ICD-10-CM | POA: Diagnosis not present

## 2017-12-25 DIAGNOSIS — R569 Unspecified convulsions: Secondary | ICD-10-CM | POA: Diagnosis not present

## 2017-12-25 DIAGNOSIS — C37 Malignant neoplasm of thymus: Secondary | ICD-10-CM | POA: Diagnosis not present

## 2017-12-25 DIAGNOSIS — G4730 Sleep apnea, unspecified: Secondary | ICD-10-CM | POA: Diagnosis not present

## 2017-12-25 DIAGNOSIS — F0789 Other personality and behavioral disorders due to known physiological condition: Secondary | ICD-10-CM | POA: Diagnosis not present

## 2017-12-25 DIAGNOSIS — F331 Major depressive disorder, recurrent, moderate: Secondary | ICD-10-CM | POA: Diagnosis not present

## 2017-12-29 ENCOUNTER — Encounter: Payer: Self-pay | Admitting: Family Medicine

## 2017-12-30 ENCOUNTER — Other Ambulatory Visit: Payer: Self-pay | Admitting: Family Medicine

## 2017-12-30 DIAGNOSIS — R918 Other nonspecific abnormal finding of lung field: Secondary | ICD-10-CM | POA: Diagnosis not present

## 2017-12-30 DIAGNOSIS — C37 Malignant neoplasm of thymus: Secondary | ICD-10-CM | POA: Diagnosis not present

## 2017-12-30 MED ORDER — AMOXICILLIN-POT CLAVULANATE 875-125 MG PO TABS: 1.0000 | ORAL_TABLET | Freq: Two times a day (BID) | ORAL | 0 refills | Status: DC

## 2017-12-31 ENCOUNTER — Other Ambulatory Visit (INDEPENDENT_AMBULATORY_CARE_PROVIDER_SITE_OTHER): Payer: Medicare Other

## 2017-12-31 DIAGNOSIS — R319 Hematuria, unspecified: Secondary | ICD-10-CM | POA: Diagnosis not present

## 2017-12-31 LAB — URINALYSIS, ROUTINE W REFLEX MICROSCOPIC
BILIRUBIN URINE: NEGATIVE
Ketones, ur: NEGATIVE
Leukocytes, UA: NEGATIVE
NITRITE: NEGATIVE
Specific Gravity, Urine: 1.025 (ref 1.000–1.030)
Total Protein, Urine: NEGATIVE
Urine Glucose: NEGATIVE
Urobilinogen, UA: 0.2 (ref 0.0–1.0)
pH: 5.5 (ref 5.0–8.0)

## 2018-01-01 DIAGNOSIS — D6861 Antiphospholipid syndrome: Secondary | ICD-10-CM | POA: Diagnosis not present

## 2018-01-01 DIAGNOSIS — Z5181 Encounter for therapeutic drug level monitoring: Secondary | ICD-10-CM | POA: Diagnosis not present

## 2018-01-01 DIAGNOSIS — C37 Malignant neoplasm of thymus: Secondary | ICD-10-CM | POA: Diagnosis not present

## 2018-01-01 DIAGNOSIS — Z7901 Long term (current) use of anticoagulants: Secondary | ICD-10-CM | POA: Diagnosis not present

## 2018-01-01 DIAGNOSIS — Z86718 Personal history of other venous thrombosis and embolism: Secondary | ICD-10-CM | POA: Diagnosis not present

## 2018-01-02 ENCOUNTER — Other Ambulatory Visit: Payer: Self-pay | Admitting: Family Medicine

## 2018-01-02 DIAGNOSIS — R3129 Other microscopic hematuria: Secondary | ICD-10-CM

## 2018-01-05 DIAGNOSIS — Z950 Presence of cardiac pacemaker: Secondary | ICD-10-CM | POA: Diagnosis not present

## 2018-01-06 DIAGNOSIS — C37 Malignant neoplasm of thymus: Secondary | ICD-10-CM | POA: Diagnosis not present

## 2018-01-08 ENCOUNTER — Other Ambulatory Visit: Payer: Medicare Other

## 2018-01-12 ENCOUNTER — Other Ambulatory Visit (INDEPENDENT_AMBULATORY_CARE_PROVIDER_SITE_OTHER): Payer: Medicare Other

## 2018-01-12 DIAGNOSIS — R3129 Other microscopic hematuria: Secondary | ICD-10-CM | POA: Diagnosis not present

## 2018-01-12 LAB — URINALYSIS, ROUTINE W REFLEX MICROSCOPIC
BILIRUBIN URINE: NEGATIVE
LEUKOCYTES UA: NEGATIVE
Nitrite: NEGATIVE
Specific Gravity, Urine: 1.02 (ref 1.000–1.030)
Total Protein, Urine: NEGATIVE
Urine Glucose: NEGATIVE
Urobilinogen, UA: 0.2 (ref 0.0–1.0)
pH: 6 (ref 5.0–8.0)

## 2018-01-14 ENCOUNTER — Other Ambulatory Visit: Payer: Self-pay | Admitting: Family Medicine

## 2018-01-14 DIAGNOSIS — R319 Hematuria, unspecified: Secondary | ICD-10-CM

## 2018-01-20 ENCOUNTER — Encounter: Payer: Self-pay | Admitting: Family Medicine

## 2018-01-20 DIAGNOSIS — C37 Malignant neoplasm of thymus: Secondary | ICD-10-CM | POA: Diagnosis not present

## 2018-01-20 DIAGNOSIS — R918 Other nonspecific abnormal finding of lung field: Secondary | ICD-10-CM | POA: Diagnosis not present

## 2018-01-26 NOTE — Telephone Encounter (Signed)
Urology referrals has to be reviewed it does take time to get those appt

## 2018-01-27 DIAGNOSIS — Z86718 Personal history of other venous thrombosis and embolism: Secondary | ICD-10-CM | POA: Diagnosis not present

## 2018-01-27 DIAGNOSIS — Z5181 Encounter for therapeutic drug level monitoring: Secondary | ICD-10-CM | POA: Diagnosis not present

## 2018-01-27 DIAGNOSIS — C37 Malignant neoplasm of thymus: Secondary | ICD-10-CM | POA: Diagnosis not present

## 2018-01-27 DIAGNOSIS — D6861 Antiphospholipid syndrome: Secondary | ICD-10-CM | POA: Diagnosis not present

## 2018-01-27 DIAGNOSIS — Z7901 Long term (current) use of anticoagulants: Secondary | ICD-10-CM | POA: Diagnosis not present

## 2018-02-02 DIAGNOSIS — C37 Malignant neoplasm of thymus: Secondary | ICD-10-CM | POA: Diagnosis not present

## 2018-02-02 DIAGNOSIS — Z5111 Encounter for antineoplastic chemotherapy: Secondary | ICD-10-CM | POA: Diagnosis not present

## 2018-02-02 DIAGNOSIS — D6861 Antiphospholipid syndrome: Secondary | ICD-10-CM | POA: Diagnosis not present

## 2018-02-02 DIAGNOSIS — G0490 Encephalitis and encephalomyelitis, unspecified: Secondary | ICD-10-CM | POA: Diagnosis not present

## 2018-02-02 DIAGNOSIS — G0481 Other encephalitis and encephalomyelitis: Secondary | ICD-10-CM | POA: Diagnosis not present

## 2018-02-02 DIAGNOSIS — Z86718 Personal history of other venous thrombosis and embolism: Secondary | ICD-10-CM | POA: Diagnosis not present

## 2018-02-05 DIAGNOSIS — F09 Unspecified mental disorder due to known physiological condition: Secondary | ICD-10-CM | POA: Diagnosis not present

## 2018-02-05 DIAGNOSIS — G4730 Sleep apnea, unspecified: Secondary | ICD-10-CM | POA: Diagnosis not present

## 2018-02-05 DIAGNOSIS — F331 Major depressive disorder, recurrent, moderate: Secondary | ICD-10-CM | POA: Diagnosis not present

## 2018-02-05 DIAGNOSIS — G0481 Other encephalitis and encephalomyelitis: Secondary | ICD-10-CM | POA: Diagnosis not present

## 2018-02-05 DIAGNOSIS — F0789 Other personality and behavioral disorders due to known physiological condition: Secondary | ICD-10-CM | POA: Diagnosis not present

## 2018-02-05 DIAGNOSIS — C37 Malignant neoplasm of thymus: Secondary | ICD-10-CM | POA: Diagnosis not present

## 2018-02-09 DIAGNOSIS — Z5111 Encounter for antineoplastic chemotherapy: Secondary | ICD-10-CM | POA: Diagnosis not present

## 2018-02-09 DIAGNOSIS — C37 Malignant neoplasm of thymus: Secondary | ICD-10-CM | POA: Diagnosis not present

## 2018-02-09 DIAGNOSIS — G0490 Encephalitis and encephalomyelitis, unspecified: Secondary | ICD-10-CM | POA: Diagnosis not present

## 2018-02-09 DIAGNOSIS — Z09 Encounter for follow-up examination after completed treatment for conditions other than malignant neoplasm: Secondary | ICD-10-CM | POA: Diagnosis not present

## 2018-02-09 DIAGNOSIS — R0609 Other forms of dyspnea: Secondary | ICD-10-CM | POA: Diagnosis not present

## 2018-02-09 DIAGNOSIS — K5903 Drug induced constipation: Secondary | ICD-10-CM | POA: Diagnosis not present

## 2018-02-09 DIAGNOSIS — D6861 Antiphospholipid syndrome: Secondary | ICD-10-CM | POA: Diagnosis not present

## 2018-02-09 DIAGNOSIS — R530 Neoplastic (malignant) related fatigue: Secondary | ICD-10-CM | POA: Diagnosis not present

## 2018-02-18 ENCOUNTER — Encounter: Payer: Self-pay | Admitting: Family Medicine

## 2018-02-18 MED ORDER — LANSOPRAZOLE 15 MG PO CPDR: 20.0000 mg | DELAYED_RELEASE_CAPSULE | Freq: Every day | ORAL | 1 refills | Status: DC

## 2018-02-24 DIAGNOSIS — E2749 Other adrenocortical insufficiency: Secondary | ICD-10-CM | POA: Diagnosis not present

## 2018-02-24 DIAGNOSIS — R0609 Other forms of dyspnea: Secondary | ICD-10-CM | POA: Diagnosis not present

## 2018-02-24 DIAGNOSIS — C7801 Secondary malignant neoplasm of right lung: Secondary | ICD-10-CM | POA: Diagnosis not present

## 2018-02-24 DIAGNOSIS — D6861 Antiphospholipid syndrome: Secondary | ICD-10-CM | POA: Diagnosis not present

## 2018-02-24 DIAGNOSIS — Z5111 Encounter for antineoplastic chemotherapy: Secondary | ICD-10-CM | POA: Diagnosis not present

## 2018-02-24 DIAGNOSIS — C37 Malignant neoplasm of thymus: Secondary | ICD-10-CM | POA: Diagnosis not present

## 2018-02-24 DIAGNOSIS — R0602 Shortness of breath: Secondary | ICD-10-CM | POA: Diagnosis not present

## 2018-02-24 DIAGNOSIS — Z09 Encounter for follow-up examination after completed treatment for conditions other than malignant neoplasm: Secondary | ICD-10-CM | POA: Diagnosis not present

## 2018-02-24 DIAGNOSIS — G0481 Other encephalitis and encephalomyelitis: Secondary | ICD-10-CM | POA: Diagnosis not present

## 2018-02-24 DIAGNOSIS — Z7901 Long term (current) use of anticoagulants: Secondary | ICD-10-CM | POA: Diagnosis not present

## 2018-02-24 DIAGNOSIS — R530 Neoplastic (malignant) related fatigue: Secondary | ICD-10-CM | POA: Diagnosis not present

## 2018-03-03 ENCOUNTER — Encounter: Payer: Self-pay | Admitting: Medical

## 2018-03-03 ENCOUNTER — Telehealth: Payer: Self-pay

## 2018-03-03 ENCOUNTER — Ambulatory Visit (INDEPENDENT_AMBULATORY_CARE_PROVIDER_SITE_OTHER): Payer: Medicare Other | Admitting: Medical

## 2018-03-03 ENCOUNTER — Other Ambulatory Visit: Payer: Self-pay | Admitting: Medical

## 2018-03-03 VITALS — BP 119/78 | HR 109 | Temp 98.3°F | Resp 16 | Ht 72.0 in | Wt 222.4 lb

## 2018-03-03 DIAGNOSIS — R059 Cough, unspecified: Secondary | ICD-10-CM

## 2018-03-03 DIAGNOSIS — R05 Cough: Secondary | ICD-10-CM | POA: Diagnosis not present

## 2018-03-03 DIAGNOSIS — J0120 Acute ethmoidal sinusitis, unspecified: Secondary | ICD-10-CM

## 2018-03-03 MED ORDER — AMOXICILLIN-POT CLAVULANATE 875-125 MG PO TABS: 1.0000 | ORAL_TABLET | Freq: Two times a day (BID) | ORAL | 0 refills | Status: DC

## 2018-03-03 MED ORDER — FLUTICASONE PROPIONATE 50 MCG/ACT NA SUSP: 2.0000 | Freq: Every day | NASAL | 1 refills | Status: DC

## 2018-03-03 MED ORDER — HYDROCODONE-HOMATROPINE 5-1.5 MG/5ML PO SYRP: 5.0000 mL | ORAL_SOLUTION | Freq: Three times a day (TID) | ORAL | 0 refills | Status: DC | PRN

## 2018-03-03 NOTE — Telephone Encounter (Signed)
PA initiated via Covermymeds; KEY: [id number]. Awaiting determination.

## 2018-03-03 NOTE — Progress Notes (Signed)
Subjective:    Patient ID: Robert Kirby, male    DOB: [birth year], 67 y.o.   MRN: [id number]  HPI  Pt in with sinus pressure, pnd and some cough x 2 days. Pt did take delsym last night. He states seemed to keep him up at night.  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   No fever or chills. But does feel tired.   In the past used to get sinus infection easy in the past.      Review of Systems  Constitutional: Positive for fatigue. Negative for chills and fever.       Mild-moderate  HENT: Positive for congestion, postnasal drip and sinus pain. Negative for dental problem, facial swelling, hearing loss, mouth sores and nosebleeds.   Respiratory: Positive for cough. Negative for choking, chest tightness and shortness of breath.   Cardiovascular: Negative for chest pain and palpitations.  Neurological: Negative for dizziness, speech difficulty, weakness, numbness and headaches.  Hematological: Negative for adenopathy. Does not bruise/bleed easily.  Psychiatric/Behavioral: Negative for behavioral problems and confusion.   Past Medical History:  Diagnosis Date  . Addison's disease ([place])   . Antiphospholipid antibody syndrome ([place])   . Arrhythmia    Pacemaker  . Autoimmune encephalomyelitis   . Depression   . OSA on CPAP   . 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 Marital status: Married    Spouse name: Not on file  . Number of children: Not on file  . Years of education: Not on file  . Highest education level: Not on file  Occupational History  . Not on file  Social Needs  . Financial resource strain: Not on file  . Food insecurity:    Worry: Not on file    Inability: Not on file  . Transportation needs:    Medical: Not on file    Non-medical: Not on file  Tobacco Use  . Smoking status: Never Smoker  . Smokeless tobacco: Never Used  Substance and Sexual Activity  . Alcohol use: Yes    Comment: socially  .  Drug use: No  . Sexual activity: Never  Lifestyle  . Physical activity:    Days per week: Not on file    Minutes per session: Not on file  . Stress: Not on file  Relationships  . Social connections:    Talks on phone: Not on file    Gets together: Not on file    Attends religious service: Not on file    Active member of club or organization: Not on file    Attends meetings of clubs or organizations: Not on file    Relationship status: Not on file  . Intimate partner violence:    Fear of current or ex partner: Not on file    Emotionally abused: Not on file    Physically abused: Not on file    Forced sexual activity: Not on file  Other Topics Concern  . Not on file  Social History Narrative  . Not on file    Past Surgical History:  Procedure Laterality Date  . CHOLECYSTECTOMY  2000  . PACEMAKER PLACEMENT    . TONSILLECTOMY AND ADENOIDECTOMY  1959    Family History  Problem Relation Age of Onset  . Depression Mother   . High blood pressure Daughter     Allergies  Allergen Reactions  . Tetracyclines & Related Nausea Only    Current  Outpatient Medications on File Prior to Visit  Medication Sig Dispense Refill  . amoxicillin-clavulanate (AUGMENTIN) 875-125 MG tablet Take 1 tablet by mouth 2 (two) times daily. 20 tablet 0  . busPIRone (BUSPAR) 7.5 MG tablet Take 2 tablets in the am and 2 tablets in the pm    . carbamazepine (TEGRETOL) 200 MG tablet Take 2 tablets in the am and 2 tablets in the pm    . escitalopram (LEXAPRO) 20 MG tablet Take 20 mg by mouth daily.    . hydrocortisone (CORTEF) 10 MG tablet 2 tablets in the am and 1 tablet in the pm--Addison's Disease 45 tablet 1  . L-Methylfolate (DEPLIN PO) Take by mouth daily.    . lansoprazole (PREVACID) 15 MG capsule Take 1 capsule (15 mg total) by mouth daily. 90 capsule 1  . levocetirizine (XYZAL) 5 MG tablet Take 1 tablet (5 mg total) by mouth every evening. 30 tablet 3  . memantine (NAMENDA) 10 MG tablet Take 10  mg by mouth 2 (two) times daily.    . mirtazapine (REMERON) 45 MG tablet Take 45 mg by mouth at bedtime.    . riTUXimab (RITUXAN) 100 MG/10ML injection Inject into the vein every 6 (six) months.    . valproic acid (DEPAKENE) 250 MG capsule Take 3 capsules in the am and 3 capsules in the pm    . warfarin (COUMADIN) 5 MG tablet As directed     No current facility-administered medications on file prior to visit.     BP 119/78   Pulse (!) 119   Temp 98.3 F (36.8 C) (Oral)   Resp 16   Ht 6' (1.829 m)   Wt 222 lb 6.4 oz (100.9 kg)   SpO2 99%   BMI 30.16 kg/m       Objective:   Physical Exam  General  Mental Status - Alert. General Appearance - Well groomed. Not in acute distress.  Skin Rashes- No Rashes.  HEENT Head- Normal. Ear Auditory Canal - Left- Normal. Right - Normal.Tympanic Membrane- Left- Normal. Right- Normal. Eye Sclera/Conjunctiva- Left- Normal. Right- Normal. Nose & Sinuses Nasal Mucosa- Left-  Boggy and Congested. Right-  Boggy and  Congested.Bilateral  Ethmoid sinus pain on palpation. No maxillary and no  frontal sinus pressure. Mouth & Throat Lips: Upper Lip- Normal: no dryness, cracking, pallor, cyanosis, or vesicular eruption. Lower Lip-Normal: no dryness, cracking, pallor, cyanosis or vesicular eruption. Buccal Mucosa- Bilateral- No Aphthous ulcers. Oropharynx- No Discharge or Erythema. Tonsils: Characteristics- Bilateral- No Erythema or Congestion. Size/Enlargement- Bilateral- No enlargement. Discharge- bilateral-None.  Neck Neck- Supple. No Masses.   Chest and Lung Exam Auscultation: Breath Sounds:-Clear even and unlabored.  Cardiovascular Auscultation:Rythm- Regular, rate and rhythm. Murmurs & Other Heart Sounds:Ausculatation of the heart reveal- No Murmurs.  Lymphatic Head & Neck General Head & Neck Lymphatics: Bilateral: Description- No Localized lymphadenopathy.       Assessment & Plan:  You do appear to have probable early sinus  infection.  With your history of recent chemotherapy, I do think it is best to treat you early for this infection.  I sent in Augmentin antibiotic.  You should progressively get better but if not please let us know.  For cough, I am prescribing Hycodan.  Rx advisement given.  For nasal congestion, I am prescribing Flonase.  Your report side effect with Delsym last night so please stop delsym.  Follow-up in 7 days or as needed.  Did advised pt to stay well hydrate.   Mackie Pai,  PA-C

## 2018-03-03 NOTE — Telephone Encounter (Addendum)
PA approved. Effective 03/03/2018 to 03/03/2019.

## 2018-03-03 NOTE — Patient Instructions (Addendum)
You do appear to have probable early sinus infection.  With your history of recent chemotherapy, I do think it is best to treat you early for this infection.  I sent in Augmentin antibiotic.  You should progressively get better but if not please let us know.  For cough, I am prescribing Hycodan.  Rx advisement given.  For nasal congestion, I am prescribing Flonase.  Your report side effect with Delsym last night so please stop delsym.  Follow-up in 7 days or as needed.

## 2018-03-10 DIAGNOSIS — Z5181 Encounter for therapeutic drug level monitoring: Secondary | ICD-10-CM | POA: Diagnosis not present

## 2018-03-10 DIAGNOSIS — D6861 Antiphospholipid syndrome: Secondary | ICD-10-CM | POA: Diagnosis not present

## 2018-03-10 DIAGNOSIS — Z7901 Long term (current) use of anticoagulants: Secondary | ICD-10-CM | POA: Diagnosis not present

## 2018-03-10 DIAGNOSIS — Z86718 Personal history of other venous thrombosis and embolism: Secondary | ICD-10-CM | POA: Diagnosis not present

## 2018-03-11 ENCOUNTER — Ambulatory Visit (HOSPITAL_BASED_OUTPATIENT_CLINIC_OR_DEPARTMENT_OTHER)
Admission: RE | Admit: 2018-03-11 | Discharge: 2018-03-11 | Disposition: A | Discharge status: Home / Self Care | Payer: Medicare Other | Source: Ambulatory Visit | Attending: Family Medicine | Admitting: Family Medicine

## 2018-03-11 ENCOUNTER — Encounter: Payer: Self-pay | Admitting: Family Medicine

## 2018-03-11 ENCOUNTER — Ambulatory Visit (INDEPENDENT_AMBULATORY_CARE_PROVIDER_SITE_OTHER): Payer: Medicare Other | Admitting: Family Medicine

## 2018-03-11 VITALS — BP 118/80 | HR 98 | Temp 98.8°F | Ht 72.0 in | Wt 225.1 lb

## 2018-03-11 DIAGNOSIS — R05 Cough: Secondary | ICD-10-CM | POA: Insufficient documentation

## 2018-03-11 DIAGNOSIS — J986 Disorders of diaphragm: Secondary | ICD-10-CM | POA: Insufficient documentation

## 2018-03-11 DIAGNOSIS — J9811 Atelectasis: Secondary | ICD-10-CM | POA: Diagnosis not present

## 2018-03-11 DIAGNOSIS — R0602 Shortness of breath: Secondary | ICD-10-CM

## 2018-03-11 DIAGNOSIS — R058 Other specified cough: Secondary | ICD-10-CM

## 2018-03-11 MED ORDER — METHYLPREDNISOLONE ACETATE 80 MG/ML IJ SUSP
80.0000 mg | Freq: Once | INTRAMUSCULAR | Status: AC
  Administered 2018-03-11: 80 mg via INTRAMUSCULAR

## 2018-03-11 NOTE — Patient Instructions (Addendum)
Continue to push fluids, practice good hand hygiene, and cover your mouth if you cough.  If you start having fevers, shaking or shortness of breath, seek immediate care.  We will be in touch with you regarding your chest X-ray. Further treatment pending this result.  Use Candy's inhaler if you feel shortness of breath or wheezing.   OK to finish antibiotic, but this is not likely bacterial since you have not improved on this.  Let us know if you need anything.

## 2018-03-11 NOTE — Progress Notes (Signed)
Chief Complaint  Patient presents with  . Cough    congestion  . Shortness of Breath    Robert Kirby here for URI complaints. Here with wife, Robert Kirby. Undergoing chemo.  Duration: 2 weeks  Associated symptoms: sinus congestion, PND, rhinorrhea, ear fullness, shortness of breath, wheezing and cough Denies: sinus pain, ear pain, ear drainage, sore throat, myalgia and fevers Treatment to date: Augmentin, INCS, Delsym Sick contacts: No  ROS:  Const: Denies fevers HEENT: As noted in HPI Lungs: +intermittent sob, wheezing, cough  Past Medical History:  Diagnosis Date  . Addison's disease ([place])   . Antiphospholipid antibody syndrome ([place])   . Arrhythmia    Pacemaker  . Autoimmune encephalomyelitis   . Depression   . OSA on CPAP   . 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 Depression Mother   . High blood pressure Daughter     BP 118/80 (BP Location: Left Arm, Patient Position: Sitting, Cuff Size: Large)   Pulse 98   Temp 98.8 F (37.1 C) (Oral)   Ht 6' (1.829 m)   Wt 225 lb 2 oz (102.1 kg)   SpO2 95%   BMI 30.53 kg/m  General: Awake, alert, appears stated age HEENT: AT, [place], ears patent b/l and TM's neg, nares patent w clear rhinorrhea, pharynx pink and without exudates, MMM Neck: No masses or asymmetry Heart: RRR Lungs: CTAB, no accessory muscle use Psych: normal mood and affect  Productive cough - Plan: DG Chest 2 View  Ck CXR. Has known lesions in RLL field and LUL field. OK to finish abx, will hold off on further abx for now pending CXR. Steroid injection today. Cont INCS and Xyzal.  Continue to push fluids, practice good hand hygiene, cover mouth when coughing.  F/u prn. If starting to experience fevers, shaking, or shortness of breath, seek immediate care. Pt and spouse voiced understanding and agreement to the plan.  [place], DO 03/11/18 3:17 PM

## 2018-03-11 NOTE — Progress Notes (Signed)
Pre visit review using our clinic review tool, if applicable. No additional management support is needed unless otherwise documented below in the visit note. 

## 2018-03-11 NOTE — Addendum Note (Signed)
Addended by: Sharon Seller B on: 03/11/2018 03:23 PM   Modules accepted: Orders

## 2018-03-16 ENCOUNTER — Other Ambulatory Visit: Payer: Self-pay | Admitting: Family Medicine

## 2018-03-16 DIAGNOSIS — R197 Diarrhea, unspecified: Secondary | ICD-10-CM | POA: Diagnosis not present

## 2018-03-16 DIAGNOSIS — C7800 Secondary malignant neoplasm of unspecified lung: Secondary | ICD-10-CM | POA: Diagnosis not present

## 2018-03-16 DIAGNOSIS — R0602 Shortness of breath: Secondary | ICD-10-CM | POA: Diagnosis not present

## 2018-03-16 DIAGNOSIS — C73 Malignant neoplasm of thyroid gland: Secondary | ICD-10-CM | POA: Diagnosis not present

## 2018-03-16 DIAGNOSIS — R0902 Hypoxemia: Secondary | ICD-10-CM | POA: Diagnosis not present

## 2018-03-16 DIAGNOSIS — R109 Unspecified abdominal pain: Secondary | ICD-10-CM | POA: Diagnosis not present

## 2018-03-16 DIAGNOSIS — Z923 Personal history of irradiation: Secondary | ICD-10-CM | POA: Diagnosis not present

## 2018-03-16 DIAGNOSIS — R530 Neoplastic (malignant) related fatigue: Secondary | ICD-10-CM | POA: Diagnosis not present

## 2018-03-16 DIAGNOSIS — Z7901 Long term (current) use of anticoagulants: Secondary | ICD-10-CM | POA: Diagnosis not present

## 2018-03-16 DIAGNOSIS — D6861 Antiphospholipid syndrome: Secondary | ICD-10-CM | POA: Diagnosis not present

## 2018-03-16 DIAGNOSIS — C37 Malignant neoplasm of thymus: Secondary | ICD-10-CM | POA: Diagnosis not present

## 2018-03-16 DIAGNOSIS — Z09 Encounter for follow-up examination after completed treatment for conditions other than malignant neoplasm: Secondary | ICD-10-CM | POA: Diagnosis not present

## 2018-03-16 DIAGNOSIS — R0609 Other forms of dyspnea: Secondary | ICD-10-CM | POA: Diagnosis not present

## 2018-03-16 DIAGNOSIS — Z5111 Encounter for antineoplastic chemotherapy: Secondary | ICD-10-CM | POA: Diagnosis not present

## 2018-03-16 DIAGNOSIS — G0481 Other encephalitis and encephalomyelitis: Secondary | ICD-10-CM | POA: Diagnosis not present

## 2018-03-16 DIAGNOSIS — E2740 Unspecified adrenocortical insufficiency: Secondary | ICD-10-CM | POA: Diagnosis not present

## 2018-03-16 DIAGNOSIS — J301 Allergic rhinitis due to pollen: Secondary | ICD-10-CM

## 2018-04-07 DIAGNOSIS — C37 Malignant neoplasm of thymus: Secondary | ICD-10-CM | POA: Diagnosis not present

## 2018-04-07 DIAGNOSIS — T451X5A Adverse effect of antineoplastic and immunosuppressive drugs, initial encounter: Secondary | ICD-10-CM | POA: Diagnosis not present

## 2018-04-07 DIAGNOSIS — Z79899 Other long term (current) drug therapy: Secondary | ICD-10-CM | POA: Diagnosis not present

## 2018-04-07 DIAGNOSIS — Z7901 Long term (current) use of anticoagulants: Secondary | ICD-10-CM | POA: Diagnosis not present

## 2018-04-07 DIAGNOSIS — R0609 Other forms of dyspnea: Secondary | ICD-10-CM | POA: Diagnosis not present

## 2018-04-07 DIAGNOSIS — E2740 Unspecified adrenocortical insufficiency: Secondary | ICD-10-CM | POA: Diagnosis not present

## 2018-04-07 DIAGNOSIS — D6861 Antiphospholipid syndrome: Secondary | ICD-10-CM | POA: Diagnosis not present

## 2018-04-07 DIAGNOSIS — R0902 Hypoxemia: Secondary | ICD-10-CM | POA: Diagnosis not present

## 2018-04-07 DIAGNOSIS — R918 Other nonspecific abnormal finding of lung field: Secondary | ICD-10-CM | POA: Diagnosis not present

## 2018-04-07 DIAGNOSIS — G0481 Other encephalitis and encephalomyelitis: Secondary | ICD-10-CM | POA: Diagnosis not present

## 2018-04-07 DIAGNOSIS — R530 Neoplastic (malignant) related fatigue: Secondary | ICD-10-CM | POA: Diagnosis not present

## 2018-04-09 ENCOUNTER — Encounter: Payer: Self-pay | Admitting: Family Medicine

## 2018-04-09 ENCOUNTER — Telehealth: Payer: Self-pay | Admitting: Family Medicine

## 2018-04-09 DIAGNOSIS — G0481 Other encephalitis and encephalomyelitis: Secondary | ICD-10-CM | POA: Diagnosis not present

## 2018-04-09 NOTE — Telephone Encounter (Signed)
No he doesn't. TY.

## 2018-04-09 NOTE — Telephone Encounter (Signed)
Copied from [place] [id number]. Topic: General - Other >> Apr 09, 2018  4:12 PM Valla Leaver wrote: Reason for CRM: Caren Griffins with [facility] coumadin clinic [facility], calling to ask if the patient needs a levonox bridge since he has to come off coumadin. He has surgery 07/30 for a port repair. Please advise.

## 2018-04-09 NOTE — Telephone Encounter (Signed)
Coumadin RN informed---she was just concerned because he now has cancer

## 2018-04-10 ENCOUNTER — Encounter: Payer: Self-pay | Admitting: Family Medicine

## 2018-04-10 ENCOUNTER — Other Ambulatory Visit: Payer: Self-pay | Admitting: Family Medicine

## 2018-04-10 MED ORDER — ENOXAPARIN SODIUM 100 MG/ML ~~LOC~~ SOLN: 1.0000 mg/kg | Freq: Two times a day (BID) | SUBCUTANEOUS | 0 refills | Status: DC

## 2018-04-10 NOTE — Progress Notes (Signed)
Spoke w patient's wife regarding bridging. MyChart message was also sent. We will plan to bridge. 5 days pre procedure, 2 days being off of warfarin, will start bid Lovenox 1mg /kg. Last dose likely the AM of the day before procedure. Pt's wife was send MyChart message and this was verbalized. She voiced understanding and agreement to the plan. All questions answered.

## 2018-04-10 NOTE — Telephone Encounter (Signed)
Patient wife is calling and would like to speak with Dr. Nani Ravens or his nurse in regards to this message. Please advise.   CB# [phone]

## 2018-04-17 DIAGNOSIS — D6861 Antiphospholipid syndrome: Secondary | ICD-10-CM | POA: Diagnosis not present

## 2018-04-17 DIAGNOSIS — E2740 Unspecified adrenocortical insufficiency: Secondary | ICD-10-CM | POA: Diagnosis not present

## 2018-04-17 DIAGNOSIS — R569 Unspecified convulsions: Secondary | ICD-10-CM | POA: Diagnosis not present

## 2018-04-17 DIAGNOSIS — R0609 Other forms of dyspnea: Secondary | ICD-10-CM | POA: Diagnosis not present

## 2018-04-17 DIAGNOSIS — G4733 Obstructive sleep apnea (adult) (pediatric): Secondary | ICD-10-CM | POA: Diagnosis not present

## 2018-04-17 DIAGNOSIS — C37 Malignant neoplasm of thymus: Secondary | ICD-10-CM | POA: Diagnosis not present

## 2018-04-17 DIAGNOSIS — G0481 Other encephalitis and encephalomyelitis: Secondary | ICD-10-CM | POA: Diagnosis not present

## 2018-04-17 DIAGNOSIS — K219 Gastro-esophageal reflux disease without esophagitis: Secondary | ICD-10-CM | POA: Diagnosis not present

## 2018-04-17 DIAGNOSIS — Z86718 Personal history of other venous thrombosis and embolism: Secondary | ICD-10-CM | POA: Diagnosis not present

## 2018-04-17 DIAGNOSIS — Z8719 Personal history of other diseases of the digestive system: Secondary | ICD-10-CM | POA: Diagnosis not present

## 2018-04-20 DIAGNOSIS — R918 Other nonspecific abnormal finding of lung field: Secondary | ICD-10-CM | POA: Diagnosis not present

## 2018-04-20 DIAGNOSIS — Z5181 Encounter for therapeutic drug level monitoring: Secondary | ICD-10-CM | POA: Diagnosis not present

## 2018-04-20 DIAGNOSIS — Z79899 Other long term (current) drug therapy: Secondary | ICD-10-CM | POA: Diagnosis not present

## 2018-04-21 DIAGNOSIS — R0602 Shortness of breath: Secondary | ICD-10-CM | POA: Diagnosis not present

## 2018-04-21 DIAGNOSIS — Z452 Encounter for adjustment and management of vascular access device: Secondary | ICD-10-CM | POA: Diagnosis not present

## 2018-04-21 DIAGNOSIS — Z9221 Personal history of antineoplastic chemotherapy: Secondary | ICD-10-CM | POA: Diagnosis not present

## 2018-04-21 DIAGNOSIS — I519 Heart disease, unspecified: Secondary | ICD-10-CM | POA: Diagnosis not present

## 2018-04-21 DIAGNOSIS — G0490 Encephalitis and encephalomyelitis, unspecified: Secondary | ICD-10-CM | POA: Diagnosis not present

## 2018-04-24 DIAGNOSIS — I447 Left bundle-branch block, unspecified: Secondary | ICD-10-CM | POA: Diagnosis not present

## 2018-04-24 DIAGNOSIS — D6861 Antiphospholipid syndrome: Secondary | ICD-10-CM | POA: Diagnosis not present

## 2018-04-24 DIAGNOSIS — R0609 Other forms of dyspnea: Secondary | ICD-10-CM | POA: Diagnosis not present

## 2018-04-24 DIAGNOSIS — E7800 Pure hypercholesterolemia, unspecified: Secondary | ICD-10-CM | POA: Diagnosis not present

## 2018-04-24 DIAGNOSIS — J9811 Atelectasis: Secondary | ICD-10-CM | POA: Diagnosis not present

## 2018-04-24 DIAGNOSIS — Z86718 Personal history of other venous thrombosis and embolism: Secondary | ICD-10-CM | POA: Diagnosis not present

## 2018-04-27 DIAGNOSIS — Z9981 Dependence on supplemental oxygen: Secondary | ICD-10-CM | POA: Diagnosis not present

## 2018-04-27 DIAGNOSIS — Z7952 Long term (current) use of systemic steroids: Secondary | ICD-10-CM | POA: Diagnosis not present

## 2018-04-27 DIAGNOSIS — G40909 Epilepsy, unspecified, not intractable, without status epilepticus: Secondary | ICD-10-CM | POA: Diagnosis not present

## 2018-04-27 DIAGNOSIS — Z9221 Personal history of antineoplastic chemotherapy: Secondary | ICD-10-CM | POA: Diagnosis not present

## 2018-04-27 DIAGNOSIS — E668 Other obesity: Secondary | ICD-10-CM | POA: Diagnosis not present

## 2018-04-27 DIAGNOSIS — D6861 Antiphospholipid syndrome: Secondary | ICD-10-CM | POA: Diagnosis not present

## 2018-04-27 DIAGNOSIS — Z86718 Personal history of other venous thrombosis and embolism: Secondary | ICD-10-CM | POA: Diagnosis not present

## 2018-04-27 DIAGNOSIS — Z923 Personal history of irradiation: Secondary | ICD-10-CM | POA: Diagnosis not present

## 2018-04-27 DIAGNOSIS — Z79899 Other long term (current) drug therapy: Secondary | ICD-10-CM | POA: Diagnosis not present

## 2018-04-27 DIAGNOSIS — I447 Left bundle-branch block, unspecified: Secondary | ICD-10-CM | POA: Diagnosis not present

## 2018-04-27 DIAGNOSIS — K219 Gastro-esophageal reflux disease without esophagitis: Secondary | ICD-10-CM | POA: Diagnosis not present

## 2018-04-27 DIAGNOSIS — Z950 Presence of cardiac pacemaker: Secondary | ICD-10-CM | POA: Diagnosis not present

## 2018-04-27 DIAGNOSIS — Z7951 Long term (current) use of inhaled steroids: Secondary | ICD-10-CM | POA: Diagnosis not present

## 2018-04-27 DIAGNOSIS — J8401 Alveolar proteinosis: Secondary | ICD-10-CM | POA: Diagnosis not present

## 2018-04-27 DIAGNOSIS — R911 Solitary pulmonary nodule: Secondary | ICD-10-CM | POA: Diagnosis not present

## 2018-04-27 DIAGNOSIS — Z85238 Personal history of other malignant neoplasm of thymus: Secondary | ICD-10-CM | POA: Diagnosis not present

## 2018-04-27 DIAGNOSIS — R918 Other nonspecific abnormal finding of lung field: Secondary | ICD-10-CM | POA: Diagnosis not present

## 2018-04-27 DIAGNOSIS — G4730 Sleep apnea, unspecified: Secondary | ICD-10-CM | POA: Diagnosis not present

## 2018-04-27 DIAGNOSIS — Z7901 Long term (current) use of anticoagulants: Secondary | ICD-10-CM | POA: Diagnosis not present

## 2018-04-27 DIAGNOSIS — J8489 Other specified interstitial pulmonary diseases: Secondary | ICD-10-CM | POA: Diagnosis not present

## 2018-04-27 DIAGNOSIS — J8410 Pulmonary fibrosis, unspecified: Secondary | ICD-10-CM | POA: Diagnosis not present

## 2018-05-04 DIAGNOSIS — D6861 Antiphospholipid syndrome: Secondary | ICD-10-CM | POA: Diagnosis not present

## 2018-05-04 DIAGNOSIS — Z86718 Personal history of other venous thrombosis and embolism: Secondary | ICD-10-CM | POA: Diagnosis not present

## 2018-05-04 DIAGNOSIS — Z7901 Long term (current) use of anticoagulants: Secondary | ICD-10-CM | POA: Diagnosis not present

## 2018-05-04 DIAGNOSIS — Z5181 Encounter for therapeutic drug level monitoring: Secondary | ICD-10-CM | POA: Diagnosis not present

## 2018-05-05 DIAGNOSIS — C37 Malignant neoplasm of thymus: Secondary | ICD-10-CM | POA: Diagnosis not present

## 2018-05-05 DIAGNOSIS — G0490 Encephalitis and encephalomyelitis, unspecified: Secondary | ICD-10-CM | POA: Diagnosis not present

## 2018-05-05 DIAGNOSIS — R0609 Other forms of dyspnea: Secondary | ICD-10-CM | POA: Diagnosis not present

## 2018-05-05 DIAGNOSIS — J704 Drug-induced interstitial lung disorders, unspecified: Secondary | ICD-10-CM | POA: Diagnosis not present

## 2018-05-05 DIAGNOSIS — Z9049 Acquired absence of other specified parts of digestive tract: Secondary | ICD-10-CM | POA: Diagnosis not present

## 2018-05-05 DIAGNOSIS — E2740 Unspecified adrenocortical insufficiency: Secondary | ICD-10-CM | POA: Diagnosis not present

## 2018-05-05 DIAGNOSIS — Z79899 Other long term (current) drug therapy: Secondary | ICD-10-CM | POA: Diagnosis not present

## 2018-05-05 DIAGNOSIS — Z7901 Long term (current) use of anticoagulants: Secondary | ICD-10-CM | POA: Diagnosis not present

## 2018-05-05 DIAGNOSIS — T50995A Adverse effect of other drugs, medicaments and biological substances, initial encounter: Secondary | ICD-10-CM | POA: Diagnosis not present

## 2018-05-05 DIAGNOSIS — D6861 Antiphospholipid syndrome: Secondary | ICD-10-CM | POA: Diagnosis not present

## 2018-05-12 DIAGNOSIS — Z8679 Personal history of other diseases of the circulatory system: Secondary | ICD-10-CM | POA: Diagnosis not present

## 2018-05-12 DIAGNOSIS — F0789 Other personality and behavioral disorders due to known physiological condition: Secondary | ICD-10-CM | POA: Diagnosis not present

## 2018-05-12 DIAGNOSIS — G0481 Other encephalitis and encephalomyelitis: Secondary | ICD-10-CM | POA: Diagnosis not present

## 2018-05-12 DIAGNOSIS — F09 Unspecified mental disorder due to known physiological condition: Secondary | ICD-10-CM | POA: Diagnosis not present

## 2018-05-12 DIAGNOSIS — C37 Malignant neoplasm of thymus: Secondary | ICD-10-CM | POA: Diagnosis not present

## 2018-05-12 DIAGNOSIS — G4730 Sleep apnea, unspecified: Secondary | ICD-10-CM | POA: Diagnosis not present

## 2018-05-12 DIAGNOSIS — F331 Major depressive disorder, recurrent, moderate: Secondary | ICD-10-CM | POA: Diagnosis not present

## 2018-05-12 DIAGNOSIS — R530 Neoplastic (malignant) related fatigue: Secondary | ICD-10-CM | POA: Diagnosis not present

## 2018-05-15 ENCOUNTER — Other Ambulatory Visit: Payer: Self-pay | Admitting: Medical

## 2018-05-15 DIAGNOSIS — C37 Malignant neoplasm of thymus: Secondary | ICD-10-CM | POA: Diagnosis not present

## 2018-05-15 DIAGNOSIS — Z7901 Long term (current) use of anticoagulants: Secondary | ICD-10-CM | POA: Diagnosis not present

## 2018-05-15 DIAGNOSIS — D6861 Antiphospholipid syndrome: Secondary | ICD-10-CM | POA: Diagnosis not present

## 2018-05-15 DIAGNOSIS — Z86718 Personal history of other venous thrombosis and embolism: Secondary | ICD-10-CM | POA: Diagnosis not present

## 2018-05-15 DIAGNOSIS — Z5181 Encounter for therapeutic drug level monitoring: Secondary | ICD-10-CM | POA: Diagnosis not present

## 2018-05-19 DIAGNOSIS — G0481 Other encephalitis and encephalomyelitis: Secondary | ICD-10-CM | POA: Diagnosis not present

## 2018-05-19 DIAGNOSIS — R569 Unspecified convulsions: Secondary | ICD-10-CM | POA: Diagnosis not present

## 2018-05-27 ENCOUNTER — Other Ambulatory Visit: Payer: Self-pay | Admitting: Family Medicine

## 2018-05-27 ENCOUNTER — Encounter: Payer: Self-pay | Admitting: Family Medicine

## 2018-05-27 DIAGNOSIS — Z5181 Encounter for therapeutic drug level monitoring: Secondary | ICD-10-CM | POA: Diagnosis not present

## 2018-05-27 DIAGNOSIS — C37 Malignant neoplasm of thymus: Secondary | ICD-10-CM | POA: Diagnosis not present

## 2018-05-27 DIAGNOSIS — Z7901 Long term (current) use of anticoagulants: Secondary | ICD-10-CM | POA: Diagnosis not present

## 2018-05-27 DIAGNOSIS — R791 Abnormal coagulation profile: Secondary | ICD-10-CM | POA: Diagnosis not present

## 2018-05-27 DIAGNOSIS — D6861 Antiphospholipid syndrome: Secondary | ICD-10-CM | POA: Diagnosis not present

## 2018-05-27 DIAGNOSIS — Z86718 Personal history of other venous thrombosis and embolism: Secondary | ICD-10-CM | POA: Diagnosis not present

## 2018-05-27 MED ORDER — LANSOPRAZOLE 15 MG PO CPDR: 20.0000 mg | DELAYED_RELEASE_CAPSULE | Freq: Every day | ORAL | 1 refills | Status: DC

## 2018-05-29 DIAGNOSIS — Z8719 Personal history of other diseases of the digestive system: Secondary | ICD-10-CM | POA: Diagnosis not present

## 2018-05-29 DIAGNOSIS — I447 Left bundle-branch block, unspecified: Secondary | ICD-10-CM | POA: Diagnosis not present

## 2018-05-29 DIAGNOSIS — E7800 Pure hypercholesterolemia, unspecified: Secondary | ICD-10-CM | POA: Diagnosis not present

## 2018-05-29 DIAGNOSIS — D6861 Antiphospholipid syndrome: Secondary | ICD-10-CM | POA: Diagnosis not present

## 2018-05-29 DIAGNOSIS — R0609 Other forms of dyspnea: Secondary | ICD-10-CM | POA: Diagnosis not present

## 2018-06-02 DIAGNOSIS — I442 Atrioventricular block, complete: Secondary | ICD-10-CM | POA: Diagnosis not present

## 2018-06-02 DIAGNOSIS — Z45018 Encounter for adjustment and management of other part of cardiac pacemaker: Secondary | ICD-10-CM | POA: Diagnosis not present

## 2018-06-02 DIAGNOSIS — Z9989 Dependence on other enabling machines and devices: Secondary | ICD-10-CM | POA: Diagnosis not present

## 2018-06-02 DIAGNOSIS — G4733 Obstructive sleep apnea (adult) (pediatric): Secondary | ICD-10-CM | POA: Diagnosis not present

## 2018-06-04 ENCOUNTER — Encounter: Payer: Self-pay | Admitting: Family Medicine

## 2018-06-04 DIAGNOSIS — J301 Allergic rhinitis due to pollen: Secondary | ICD-10-CM

## 2018-06-05 MED ORDER — HYDROCORTISONE 10 MG PO TABS: ORAL_TABLET | ORAL | 1 refills | Status: DC

## 2018-06-05 MED ORDER — LEVOCETIRIZINE DIHYDROCHLORIDE 5 MG PO TABS: ORAL_TABLET | ORAL | 3 refills | Status: DC

## 2018-06-09 DIAGNOSIS — Z79899 Other long term (current) drug therapy: Secondary | ICD-10-CM | POA: Diagnosis not present

## 2018-06-09 DIAGNOSIS — C37 Malignant neoplasm of thymus: Secondary | ICD-10-CM | POA: Diagnosis not present

## 2018-06-09 DIAGNOSIS — I442 Atrioventricular block, complete: Secondary | ICD-10-CM | POA: Diagnosis not present

## 2018-06-09 DIAGNOSIS — R918 Other nonspecific abnormal finding of lung field: Secondary | ICD-10-CM | POA: Diagnosis not present

## 2018-06-09 DIAGNOSIS — Z808 Family history of malignant neoplasm of other organs or systems: Secondary | ICD-10-CM | POA: Diagnosis not present

## 2018-06-09 DIAGNOSIS — D6861 Antiphospholipid syndrome: Secondary | ICD-10-CM | POA: Diagnosis not present

## 2018-06-09 DIAGNOSIS — I447 Left bundle-branch block, unspecified: Secondary | ICD-10-CM | POA: Diagnosis not present

## 2018-06-09 DIAGNOSIS — I429 Cardiomyopathy, unspecified: Secondary | ICD-10-CM | POA: Diagnosis not present

## 2018-06-09 DIAGNOSIS — R0902 Hypoxemia: Secondary | ICD-10-CM | POA: Diagnosis not present

## 2018-06-09 DIAGNOSIS — G0481 Other encephalitis and encephalomyelitis: Secondary | ICD-10-CM | POA: Diagnosis not present

## 2018-06-09 DIAGNOSIS — Z7952 Long term (current) use of systemic steroids: Secondary | ICD-10-CM | POA: Diagnosis not present

## 2018-06-09 DIAGNOSIS — M899 Disorder of bone, unspecified: Secondary | ICD-10-CM | POA: Diagnosis not present

## 2018-06-09 DIAGNOSIS — Z7901 Long term (current) use of anticoagulants: Secondary | ICD-10-CM | POA: Diagnosis not present

## 2018-06-09 DIAGNOSIS — E2740 Unspecified adrenocortical insufficiency: Secondary | ICD-10-CM | POA: Diagnosis not present

## 2018-06-18 DIAGNOSIS — F331 Major depressive disorder, recurrent, moderate: Secondary | ICD-10-CM | POA: Diagnosis not present

## 2018-06-18 DIAGNOSIS — T66XXXA Radiation sickness, unspecified, initial encounter: Secondary | ICD-10-CM | POA: Diagnosis not present

## 2018-06-18 DIAGNOSIS — F09 Unspecified mental disorder due to known physiological condition: Secondary | ICD-10-CM | POA: Diagnosis not present

## 2018-06-18 DIAGNOSIS — Z79899 Other long term (current) drug therapy: Secondary | ICD-10-CM | POA: Diagnosis not present

## 2018-06-18 DIAGNOSIS — G0481 Other encephalitis and encephalomyelitis: Secondary | ICD-10-CM | POA: Diagnosis not present

## 2018-06-18 DIAGNOSIS — R0609 Other forms of dyspnea: Secondary | ICD-10-CM | POA: Diagnosis not present

## 2018-06-18 DIAGNOSIS — C37 Malignant neoplasm of thymus: Secondary | ICD-10-CM | POA: Diagnosis not present

## 2018-06-18 DIAGNOSIS — G4730 Sleep apnea, unspecified: Secondary | ICD-10-CM | POA: Diagnosis not present

## 2018-06-18 DIAGNOSIS — R0602 Shortness of breath: Secondary | ICD-10-CM | POA: Diagnosis not present

## 2018-06-18 DIAGNOSIS — F0789 Other personality and behavioral disorders due to known physiological condition: Secondary | ICD-10-CM | POA: Diagnosis not present

## 2018-06-18 DIAGNOSIS — J984 Other disorders of lung: Secondary | ICD-10-CM | POA: Diagnosis not present

## 2018-06-25 DIAGNOSIS — Z7901 Long term (current) use of anticoagulants: Secondary | ICD-10-CM | POA: Diagnosis not present

## 2018-07-03 DIAGNOSIS — Z23 Encounter for immunization: Secondary | ICD-10-CM | POA: Diagnosis not present

## 2018-07-06 ENCOUNTER — Ambulatory Visit: Payer: Medicare Other | Admitting: Internal Medicine

## 2018-07-13 ENCOUNTER — Encounter: Payer: Self-pay | Admitting: Family Medicine

## 2018-07-14 MED ORDER — HYDROCORTISONE 10 MG PO TABS: ORAL_TABLET | ORAL | 1 refills | Status: DC

## 2018-07-16 DIAGNOSIS — G0481 Other encephalitis and encephalomyelitis: Secondary | ICD-10-CM | POA: Diagnosis not present

## 2018-07-16 DIAGNOSIS — F331 Major depressive disorder, recurrent, moderate: Secondary | ICD-10-CM | POA: Diagnosis not present

## 2018-07-16 DIAGNOSIS — G9340 Encephalopathy, unspecified: Secondary | ICD-10-CM | POA: Diagnosis not present

## 2018-07-16 DIAGNOSIS — F09 Unspecified mental disorder due to known physiological condition: Secondary | ICD-10-CM | POA: Diagnosis not present

## 2018-07-16 DIAGNOSIS — F0789 Other personality and behavioral disorders due to known physiological condition: Secondary | ICD-10-CM | POA: Diagnosis not present

## 2018-07-21 DIAGNOSIS — D6861 Antiphospholipid syndrome: Secondary | ICD-10-CM | POA: Diagnosis not present

## 2018-07-21 DIAGNOSIS — D150 Benign neoplasm of thymus: Secondary | ICD-10-CM | POA: Diagnosis not present

## 2018-07-21 DIAGNOSIS — C7800 Secondary malignant neoplasm of unspecified lung: Secondary | ICD-10-CM | POA: Diagnosis not present

## 2018-07-21 DIAGNOSIS — C37 Malignant neoplasm of thymus: Secondary | ICD-10-CM | POA: Diagnosis not present

## 2018-07-21 DIAGNOSIS — Z923 Personal history of irradiation: Secondary | ICD-10-CM | POA: Diagnosis not present

## 2018-07-21 DIAGNOSIS — I429 Cardiomyopathy, unspecified: Secondary | ICD-10-CM | POA: Diagnosis not present

## 2018-07-21 DIAGNOSIS — T451X5A Adverse effect of antineoplastic and immunosuppressive drugs, initial encounter: Secondary | ICD-10-CM | POA: Diagnosis not present

## 2018-07-21 DIAGNOSIS — Z8679 Personal history of other diseases of the circulatory system: Secondary | ICD-10-CM | POA: Diagnosis not present

## 2018-07-21 DIAGNOSIS — J704 Drug-induced interstitial lung disorders, unspecified: Secondary | ICD-10-CM | POA: Diagnosis not present

## 2018-07-21 DIAGNOSIS — R0609 Other forms of dyspnea: Secondary | ICD-10-CM | POA: Diagnosis not present

## 2018-07-21 DIAGNOSIS — G0481 Other encephalitis and encephalomyelitis: Secondary | ICD-10-CM | POA: Diagnosis not present

## 2018-07-21 DIAGNOSIS — R0902 Hypoxemia: Secondary | ICD-10-CM | POA: Diagnosis not present

## 2018-07-21 DIAGNOSIS — E2740 Unspecified adrenocortical insufficiency: Secondary | ICD-10-CM | POA: Diagnosis not present

## 2018-07-21 DIAGNOSIS — Z7901 Long term (current) use of anticoagulants: Secondary | ICD-10-CM | POA: Diagnosis not present

## 2018-07-23 ENCOUNTER — Encounter: Payer: Self-pay | Admitting: Internal Medicine

## 2018-07-27 ENCOUNTER — Other Ambulatory Visit: Payer: Self-pay | Admitting: Family Medicine

## 2018-07-27 MED ORDER — LANSOPRAZOLE 15 MG PO CPDR: 20.0000 mg | DELAYED_RELEASE_CAPSULE | Freq: Every day | ORAL | 1 refills | Status: DC

## 2018-07-27 MED ORDER — HYDROCORTISONE 10 MG PO TABS: ORAL_TABLET | ORAL | 1 refills | Status: DC

## 2018-07-28 ENCOUNTER — Encounter: Payer: Self-pay | Admitting: Family Medicine

## 2018-07-28 DIAGNOSIS — C37 Malignant neoplasm of thymus: Secondary | ICD-10-CM

## 2018-07-28 DIAGNOSIS — F0789 Other personality and behavioral disorders due to known physiological condition: Secondary | ICD-10-CM | POA: Diagnosis not present

## 2018-07-28 DIAGNOSIS — F09 Unspecified mental disorder due to known physiological condition: Secondary | ICD-10-CM | POA: Diagnosis not present

## 2018-07-28 DIAGNOSIS — J849 Interstitial pulmonary disease, unspecified: Secondary | ICD-10-CM

## 2018-07-28 DIAGNOSIS — G0481 Other encephalitis and encephalomyelitis: Secondary | ICD-10-CM | POA: Diagnosis not present

## 2018-07-29 NOTE — Telephone Encounter (Signed)
[facility] does do pul rehab with referral

## 2018-08-04 ENCOUNTER — Ambulatory Visit: Payer: Medicare Other | Admitting: Medical

## 2018-08-05 ENCOUNTER — Ambulatory Visit: Payer: Medicare Other | Admitting: Family Medicine

## 2018-08-07 DIAGNOSIS — Z7901 Long term (current) use of anticoagulants: Secondary | ICD-10-CM | POA: Diagnosis not present

## 2018-08-13 ENCOUNTER — Other Ambulatory Visit: Payer: Self-pay | Admitting: Family Medicine

## 2018-08-18 ENCOUNTER — Ambulatory Visit (INDEPENDENT_AMBULATORY_CARE_PROVIDER_SITE_OTHER): Payer: Medicare Other | Admitting: Family Medicine

## 2018-08-18 ENCOUNTER — Encounter: Payer: Self-pay | Admitting: Family Medicine

## 2018-08-18 VITALS — BP 110/68 | HR 95 | Temp 98.5°F | Ht 72.0 in | Wt 238.5 lb

## 2018-08-18 DIAGNOSIS — R195 Other fecal abnormalities: Secondary | ICD-10-CM

## 2018-08-18 DIAGNOSIS — Z8719 Personal history of other diseases of the digestive system: Secondary | ICD-10-CM

## 2018-08-18 NOTE — Progress Notes (Signed)
Pre visit review using our clinic review tool, if applicable. No additional management support is needed unless otherwise documented below in the visit note. 

## 2018-08-18 NOTE — Progress Notes (Signed)
Chief Complaint  Patient presents with  . Constipation    Subjective: Patient is a 67 y.o. male here for constipation. Here with his wife.   Oscillates b/t loose stools and constipation. Has been going on for a couple mo.  Tried MiraLax and got diarrhea. Has been more nervous lately as his encephalitis is improving. Had a GI bleed 3 yrs ago and was told to have f/u scope in 2 years. Needs referral. Never been dx'd with IBS, but has heard of it and wonders if he has it. Has not tried fiber supplements. No changes in diet, he is on a myriad of medications rx'd by other specialists.   ROS: GI: As noted in HPI  Past Medical History:  Diagnosis Date  . Addison's disease ([place])   . Antiphospholipid antibody syndrome ([place])   . Arrhythmia    Pacemaker  . Autoimmune encephalomyelitis   . Depression   . OSA on CPAP   . 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 No accessory muscle use Abd: BS+, soft, NT, mild distension, no masses or organomegaly Psych: Normal affect and mood  Assessment and Plan: History of GI bleed - Plan: Ambulatory referral to Gastroenterology  Abnormal stool caliber  #1- refer to GI #2- trial Metamucil. Stay hydrated. Eat diet high in fiber. Could consider TCA, but is on quite a few psych meds, will hold off for now.  F/u prn at this pt.  The patient voiced understanding and agreement to the plan.  [place], DO 08/18/18  3:28 PM

## 2018-08-18 NOTE — Patient Instructions (Signed)
If you do not hear anything about your referral in the next 1-2 weeks, call our office and ask for an update.  Take Metamucil 1-2 times daily. Stay hydrated. Eat your greens and fruits.  Let us know if you need anything.

## 2018-08-21 DIAGNOSIS — Z7901 Long term (current) use of anticoagulants: Secondary | ICD-10-CM | POA: Diagnosis not present

## 2018-08-21 DIAGNOSIS — Z86718 Personal history of other venous thrombosis and embolism: Secondary | ICD-10-CM | POA: Diagnosis not present

## 2018-08-21 DIAGNOSIS — Z5181 Encounter for therapeutic drug level monitoring: Secondary | ICD-10-CM | POA: Diagnosis not present

## 2018-08-21 DIAGNOSIS — D6861 Antiphospholipid syndrome: Secondary | ICD-10-CM | POA: Diagnosis not present

## 2018-08-27 DIAGNOSIS — C37 Malignant neoplasm of thymus: Secondary | ICD-10-CM | POA: Diagnosis not present

## 2018-08-27 DIAGNOSIS — G4730 Sleep apnea, unspecified: Secondary | ICD-10-CM | POA: Diagnosis not present

## 2018-08-27 DIAGNOSIS — F331 Major depressive disorder, recurrent, moderate: Secondary | ICD-10-CM | POA: Diagnosis not present

## 2018-08-27 DIAGNOSIS — F09 Unspecified mental disorder due to known physiological condition: Secondary | ICD-10-CM | POA: Diagnosis not present

## 2018-08-27 DIAGNOSIS — F410 Panic disorder [episodic paroxysmal anxiety] without agoraphobia: Secondary | ICD-10-CM | POA: Diagnosis not present

## 2018-08-27 DIAGNOSIS — F0789 Other personality and behavioral disorders due to known physiological condition: Secondary | ICD-10-CM | POA: Diagnosis not present

## 2018-08-27 DIAGNOSIS — G9340 Encephalopathy, unspecified: Secondary | ICD-10-CM | POA: Diagnosis not present

## 2018-08-27 DIAGNOSIS — R0902 Hypoxemia: Secondary | ICD-10-CM | POA: Diagnosis not present

## 2018-08-27 DIAGNOSIS — G0481 Other encephalitis and encephalomyelitis: Secondary | ICD-10-CM | POA: Diagnosis not present

## 2018-09-12 ENCOUNTER — Other Ambulatory Visit: Payer: Self-pay | Admitting: Family Medicine

## 2018-09-14 ENCOUNTER — Other Ambulatory Visit: Payer: Self-pay | Admitting: Family Medicine

## 2018-09-17 DIAGNOSIS — Z7901 Long term (current) use of anticoagulants: Secondary | ICD-10-CM | POA: Diagnosis not present

## 2018-09-22 DIAGNOSIS — R0609 Other forms of dyspnea: Secondary | ICD-10-CM | POA: Diagnosis not present

## 2018-09-22 DIAGNOSIS — R5383 Other fatigue: Secondary | ICD-10-CM | POA: Diagnosis not present

## 2018-09-22 DIAGNOSIS — D150 Benign neoplasm of thymus: Secondary | ICD-10-CM | POA: Diagnosis not present

## 2018-09-22 DIAGNOSIS — C7951 Secondary malignant neoplasm of bone: Secondary | ICD-10-CM | POA: Diagnosis not present

## 2018-09-22 DIAGNOSIS — I447 Left bundle-branch block, unspecified: Secondary | ICD-10-CM | POA: Diagnosis not present

## 2018-09-22 DIAGNOSIS — C37 Malignant neoplasm of thymus: Secondary | ICD-10-CM | POA: Diagnosis not present

## 2018-09-22 DIAGNOSIS — I429 Cardiomyopathy, unspecified: Secondary | ICD-10-CM | POA: Diagnosis not present

## 2018-09-22 DIAGNOSIS — R0602 Shortness of breath: Secondary | ICD-10-CM | POA: Diagnosis not present

## 2018-09-22 DIAGNOSIS — R918 Other nonspecific abnormal finding of lung field: Secondary | ICD-10-CM | POA: Diagnosis not present

## 2018-09-22 DIAGNOSIS — J90 Pleural effusion, not elsewhere classified: Secondary | ICD-10-CM | POA: Diagnosis not present

## 2018-09-22 DIAGNOSIS — G0490 Encephalitis and encephalomyelitis, unspecified: Secondary | ICD-10-CM | POA: Diagnosis not present

## 2018-09-22 DIAGNOSIS — E2740 Unspecified adrenocortical insufficiency: Secondary | ICD-10-CM | POA: Diagnosis not present

## 2018-09-30 ENCOUNTER — Encounter: Payer: Self-pay | Admitting: Gastroenterology

## 2018-09-30 ENCOUNTER — Ambulatory Visit (INDEPENDENT_AMBULATORY_CARE_PROVIDER_SITE_OTHER): Payer: Medicare Other | Admitting: Gastroenterology

## 2018-09-30 VITALS — BP 128/78 | HR 88 | Ht 71.5 in | Wt 241.4 lb

## 2018-09-30 DIAGNOSIS — Z8719 Personal history of other diseases of the digestive system: Secondary | ICD-10-CM

## 2018-09-30 DIAGNOSIS — K581 Irritable bowel syndrome with constipation: Secondary | ICD-10-CM

## 2018-09-30 NOTE — Progress Notes (Signed)
Chief Complaint: Abdominal complaints.  Referring Provider:  Wend[place], Nicholas [place];   #1. IBS with contipation with abdominal bloating. (MiraLAX causes him to have diarrhea) #2. H/O diverticular bleed jan 2015 s/p 13U PRBC, controlled by coil embolization by IR [facility]).  Subsequent colonoscopy in 2016 was unremarkable except for diverticular disease per patient's wife.  #3.  Multiple comorbid conditions - Stage IVb thymic carcinoma, CHF (EF 40%), antiphospholipid Ab syndrome on coumadin, CHB s/p pacemaker, adrenal insufficiency   Plan: - Align 1 tab po qd. - Benefiber 1TBS po qd with 8oz water. If he does not have any bowel movements for 3 days, he would use MiraLAX 17 g p.o. once a day until good bowel movement. - Please obtain previous records (last colonoscopy 2016 report from [place], [place]).  Larena Glassman to obtain.  Patient has given all the contact numbers for previous gastroenterologist. - FU in 8 weeks.  Earlier, if with any problems. - Hold off on colon at this time.  If he starts having any recurrent GI bleeding or if the prev colonoscopy reveals any significant lesions, he would reconsider.  I discussed above in detail with the patient and patient's wife who is Therapist, sports.   HPI:    Robert Kirby is a 68 y.o. 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  However, if he takes MiraLAX he starts having more diarrhea. No melena, hematochezia or any weight loss. Has occasional right lower quadrant abdominal pain Had negative PET CT scan for any intra-abdominal pathology as a part of work-up for thymic carcinoma.  Denies having  any nausea, vomiting, significant heartburn, odynophagia or dysphagia.  In Jan 2015, he had diverticular bleed in [place] requiring 13 units of PRBC, bleeding was controlled by chemoembolization by IR per patient's wife.  During the work-up, he was found to have incidental infiltrative mediastinal mass on CT scan.  Biopsy turned out to be metastatic thymic carcinoma.  He is being [facility] at [facility] for the same.  Past Medical History:  Diagnosis Date  . Addison's disease ([place])   . Antiphospholipid antibody syndrome ([place])   . Arrhythmia    Pacemaker  . Autoimmune encephalomyelitis   . Depression   . GERD (gastroesophageal reflux disease)   . History of blood transfusion    13 units from a GI Bleed.   Marland Kitchen History of GI bleed   . OSA on CPAP   . Sleep apnea    on CPAP  . 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[place]) 2015    Past Surgical History:  Procedure Laterality Date  . CHOLECYSTECTOMY  2000  . COLONOSCOPY  2016   was told to have one in 3 years. From [facility] [place] Dr. Amie Critchley  . ESOPHAGOGASTRODUODENOSCOPY     [place] different doctor least before 2014/15  . NOSE SURGERY     removed polyps from nose around 1996. Said he broke nose in 4 grade and needed surgery on it  . PACEMAKER PLACEMENT    . TONSILLECTOMY AND ADENOIDECTOMY  1959    Family History  Problem Relation Age of Onset  . Depression Mother   . High blood  pressure Daughter   . Colon cancer Neg Hx   . Esophageal cancer Neg Hx     Social History   Tobacco Use  . Smoking status: Never Smoker  . Smokeless tobacco: Never Used  Substance Use Topics  . Alcohol use: Yes    Comment: rare   . Drug use: No    Current Outpatient Medications  Medication Sig Dispense Refill  . busPIRone (BUSPAR) 7.5 MG tablet Take 2 tablets in the am and 2 tablets in the pm    . carbamazepine (TEGRETOL) 200 MG tablet Take 2 tablets in the am and 2 tablets in the pm    . CARVEDILOL PO Take 1 tablet by mouth 2 (two)  times daily.    Marland Kitchen escitalopram (LEXAPRO) 20 MG tablet Take 20 mg by mouth daily.    . fluticasone (FLONASE) 50 MCG/ACT nasal spray SHAKE LIQUID AND USE 2 SPRAYS IN EACH NOSTRIL DAILY 48 g 0  . hydrocortisone (CORTEF) 10 MG tablet 2 tablets in the am and 1 tablet in the pm--Addison's Disease 270 tablet 1  . L-Methylfolate (DEPLIN PO) Take by mouth daily.    . lansoprazole (PREVACID) 15 MG capsule Take 1 capsule (15 mg total) by mouth daily. 90 capsule 1  . levocetirizine (XYZAL) 5 MG tablet TAKE 1 TABLET(5 MG) BY MOUTH EVERY EVENING 30 tablet 3  . losartan (COZAAR) 25 MG tablet Take 25 mg by mouth daily.    . memantine (NAMENDA) 10 MG tablet Take 10 mg by mouth 2 (two) times daily.    . mirtazapine (REMERON) 45 MG tablet Take 45 mg by mouth at bedtime.    . riTUXimab (RITUXAN) 100 MG/10ML injection Inject into the vein every 6 (six) months.    . rosuvastatin (CRESTOR) 5 MG tablet Take 5 mg by mouth daily.    Marland Kitchen valproic acid (DEPAKENE) 250 MG capsule Take 3 capsules in the am and 3 capsules in the pm    . warfarin (COUMADIN) 5 MG tablet 5 mg at bedtime. As directed      No current facility-administered medications for this visit.     Allergies  Allergen Reactions  . Tetracyclines & Related Nausea Only    Review of Systems:  Constitutional: Denies fever, chills, diaphoresis, appetite change and fatigue. Has memory problems ever since he had encephalitis. HEENT: Denies photophobia, eye pain, redness, hearing loss, ear pain, congestion, sore throat, rhinorrhea, sneezing, mouth sores, neck pain, neck stiffness and tinnitus.   Respiratory: Denies SOB, DOE, cough, chest tightness,  and wheezing.   Cardiovascular: Denies chest pain, palpitations and leg swelling.  Genitourinary: Denies dysuria, urgency, frequency, hematuria, flank pain and difficulty urinating.  Musculoskeletal: Denies myalgias, back pain, joint swelling, arthralgias and gait problem.  Skin: No rash.  Neurological: Denies  dizziness, seizures, syncope, weakness, light-headedness, numbness and headaches.  Hematological: Denies adenopathy. Easy bruising, personal or family bleeding history  Psychiatric/Behavioral: No anxiety or depression     Physical Exam:    BP 128/78   Pulse 88   Ht 5' 11.5" (1.816 m)   Wt 241 lb 6 oz (109.5 kg)   BMI 33.20 kg/m  Filed Weights   09/30/18 1005  Weight: 241 lb 6 oz (109.5 kg)   Constitutional:  Well-developed, in no acute distress. Psychiatric: Normal mood and affect. Behavior is normal. HEENT: Pupils normal.  Conjunctivae are normal. No scleral icterus. Neck supple.  Cardiovascular: Normal rate, regular rhythm. No edema Pulmonary/chest: Effort normal and breath sounds normal. No wheezing, rales  or rhonchi. Abdominal: Soft, nondistended. Nontender. Bowel sounds active throughout. There are no masses palpable. No hepatomegaly. Rectal:  defered Neurological: Alert and oriented to person place and time. Skin: Skin is warm and dry. No rashes noted.    Carmell Austria, MD 09/30/2018, 10:24 AM  Cc: Shelda Pal*

## 2018-09-30 NOTE — Patient Instructions (Signed)
If you are age 68 or older, your body mass index should be between 23-30. Your Body mass index is 33.2 kg/m. If this is out of the aforementioned range listed, please consider follow up with your Primary Care Provider.  If you are age 80 or younger, your body mass index should be between 19-25. Your Body mass index is 33.2 kg/m. If this is out of the aformentioned range listed, please consider follow up with your Primary Care Provider.   Please purchase the following medications over the counter and take as directed: Align once daily  Benefiber once daily.   Thank you,  Dr. Jackquline Denmark

## 2018-10-06 ENCOUNTER — Telehealth: Payer: Self-pay | Admitting: Gastroenterology

## 2018-10-06 NOTE — Telephone Encounter (Signed)
I have reviewed the previous colonoscopy report from [place]  Colonoscopy 01/06/2015-diminutive colonic polyp status post polypectomy.  Biopsies tubular adenoma, moderate diverticulosis in the sigmoid, descending, transverse and descending colon. Plan: Repeat colonoscopy in 5 years  EGD 01/06/2015-slightly irregular Z line, mild gastritis.  Biopsies negative for Barrett's esophagus, negative for H. Pylori.   Bre If you could please inform the patient as well

## 2018-10-08 DIAGNOSIS — G0481 Other encephalitis and encephalomyelitis: Secondary | ICD-10-CM | POA: Diagnosis not present

## 2018-10-13 DIAGNOSIS — I428 Other cardiomyopathies: Secondary | ICD-10-CM | POA: Diagnosis not present

## 2018-10-13 DIAGNOSIS — F09 Unspecified mental disorder due to known physiological condition: Secondary | ICD-10-CM | POA: Diagnosis not present

## 2018-10-13 DIAGNOSIS — I447 Left bundle-branch block, unspecified: Secondary | ICD-10-CM | POA: Diagnosis not present

## 2018-10-13 DIAGNOSIS — F0789 Other personality and behavioral disorders due to known physiological condition: Secondary | ICD-10-CM | POA: Diagnosis not present

## 2018-10-13 DIAGNOSIS — R0609 Other forms of dyspnea: Secondary | ICD-10-CM | POA: Diagnosis not present

## 2018-10-13 DIAGNOSIS — F331 Major depressive disorder, recurrent, moderate: Secondary | ICD-10-CM | POA: Diagnosis not present

## 2018-10-13 DIAGNOSIS — G0481 Other encephalitis and encephalomyelitis: Secondary | ICD-10-CM | POA: Diagnosis not present

## 2018-10-13 DIAGNOSIS — G0490 Encephalitis and encephalomyelitis, unspecified: Secondary | ICD-10-CM | POA: Diagnosis not present

## 2018-10-13 DIAGNOSIS — C37 Malignant neoplasm of thymus: Secondary | ICD-10-CM | POA: Diagnosis not present

## 2018-10-13 DIAGNOSIS — E7800 Pure hypercholesterolemia, unspecified: Secondary | ICD-10-CM | POA: Diagnosis not present

## 2018-10-15 ENCOUNTER — Ambulatory Visit (INDEPENDENT_AMBULATORY_CARE_PROVIDER_SITE_OTHER): Payer: Medicare Other | Admitting: Pulmonary Disease

## 2018-10-15 ENCOUNTER — Encounter: Payer: Self-pay | Admitting: Family Medicine

## 2018-10-15 ENCOUNTER — Encounter: Payer: Self-pay | Admitting: Pulmonary Disease

## 2018-10-15 VITALS — BP 126/68 | HR 82 | Ht 71.5 in | Wt 246.0 lb

## 2018-10-15 DIAGNOSIS — R5381 Other malaise: Secondary | ICD-10-CM

## 2018-10-15 DIAGNOSIS — G0481 Other encephalitis and encephalomyelitis: Secondary | ICD-10-CM

## 2018-10-15 DIAGNOSIS — J301 Allergic rhinitis due to pollen: Secondary | ICD-10-CM

## 2018-10-15 DIAGNOSIS — Z9989 Dependence on other enabling machines and devices: Secondary | ICD-10-CM | POA: Diagnosis not present

## 2018-10-15 DIAGNOSIS — R0600 Dyspnea, unspecified: Secondary | ICD-10-CM

## 2018-10-15 DIAGNOSIS — C37 Malignant neoplasm of thymus: Secondary | ICD-10-CM | POA: Diagnosis not present

## 2018-10-15 DIAGNOSIS — R5383 Other fatigue: Secondary | ICD-10-CM

## 2018-10-15 DIAGNOSIS — G4733 Obstructive sleep apnea (adult) (pediatric): Secondary | ICD-10-CM

## 2018-10-15 NOTE — Progress Notes (Signed)
Synopsis: Referred to [facility] in 09/2018 for dyspnea.  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  This was treated with prednisone.  He has followed at [facility] and lives in [place].  Subjective:   PATIENT ID: Robert Kirby GENDER: male DOB: 15-Feb-1951, MRN: [id number]   HPI  Chief Complaint  Patient presents with  . Consult    referred by Dr. Nani Ravens for ILD.     This is a pleasant 68 year old male who comes to my clinic today for evaluation of shortness of breath.  In 2015 he went to a hospital in [place] with a GI bleed and was found to have a mediastinal mass encasing the main pulmonary artery.  A needle biopsy was performed at that time showing thymic carcinoma.  Under the care of [facility] he was found to have T3, N2, M1 disease with evidence of pulmonary nodules worrisome for malignancy.  He was treated with paclitaxel and carboplatin at the [facility] as well as radiation therapy.  A nodule in the right upper lobe progressed and he underwent SBRT also at the [facility].  In 2017 he relocated to [place] and transferred his care to [facility]  Unfortunately in December 2017 he had a seizure-like episode and was hospitalized.  He was found to have autoimmune encephalitis and was treated with plasmapheresis, IVIG as well as rituximab.  He has been receiving rituximab every 6 months since then.  This caused [place] quite a bit of memory loss but he has had some improvement since then.  In 2019 there are paramediastinal and pleural  lesions seen on PET scan which was worrisome for metastatic disease so he was started on pemetrexed in May 2019.  A repeat CT scan in July showed increased groundglass opacification and a bronchoscopy with biopsy showed organizing pneumonia with granulomatous inflammation.  He was started on prednisone at that time.  He was seen by Dr. Corrin Parker at [facility] on June 18, 2018 for shortness of breath.  At that time he had been receiving prednisone for quite some time and had some improvement in his shortness of breath but was still complaining of shortness of breath on exertion, a problem that is been present for several months at that point.  During clinic he had no problems with oxygenation on exertion and it was agreed upon that he should have tapering doses of prednisone as had previously been arranged by his oncology team.  He comes to my clinic today because his primary care doctor referred him in the setting of him mentioning "it does not take much to make me huff and puff".  Specifically he says that with any activity he starts to feel more short of breath.  He has noticed from time to time that his oxygen levels have dropped when he uses a finger probe.  He admittedly does not exercise much and he has had some weight gain.  He also complains of a significant amount of abdominal bloating and aerophagia.  His wife also notes that in addition to his daytime fatigue he still snores despite wearing CPAP at night.  Apparently at  [facility] at some point his pressure was adjusted from 10 cm of water to 8 cm of water after a CPAP titration study.  It is been quite sometime since he is underwent a CPAP titration.  Since he was seen at [facility] in September 2019 shortness of breath has been persistent but not necessarily worsening.  He says that he still will notice that his O2 saturation drops when he measures it with a finger probe.  It sounds as if he does this routinely with minimal activity.   Unfortunately in December 2019 a CT scan of his chest showed that her right sided pulmonary nodule had increased in size.  After consultation with his oncologist they elected to monitor this rather than change treatment plans.  In addition to his pulmonary issues he has a history of complete heart block.  Past Medical History:  Diagnosis Date  . Addison's disease ([place])   . Antiphospholipid antibody syndrome ([place])   . Arrhythmia    Pacemaker  . Autoimmune encephalomyelitis   . Depression   . GERD (gastroesophageal reflux disease)   . History of blood transfusion    13 units from a GI Bleed.   Marland Kitchen History of GI bleed   . OSA on CPAP   . Sleep apnea    on CPAP  . 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[place]) 2015     Family History  Problem Relation Age of Onset  . Depression Mother   . High blood pressure Daughter   . Colon cancer Neg Hx   . Esophageal cancer Neg Hx      Social History   Socioeconomic History  . Marital status: Married    Spouse name: Not on file  . Number of children: 2  . Years of education: Not on file  . Highest education level: Not on file  Occupational History  . Not on file  Social Needs  . Financial resource strain: Not on file  . Food insecurity:    Worry: Not on file    Inability: Not on file  . Transportation needs:    Medical: Not on file    Non-medical: Not on file  Tobacco Use  . Smoking status: Never Smoker  . Smokeless tobacco: Never Used  Substance and Sexual Activity  . Alcohol use: Yes    Comment: rare   . Drug use: No  . Sexual activity: Never  Lifestyle  . Physical activity:    Days per week: Not on file    Minutes per session: Not on file  . Stress: Not on file  Relationships  . Social connections:    Talks on phone: Not on file    Gets together: Not on file    Attends religious service: Not on file    Active member of club or organization: Not on file    Attends meetings of clubs or organizations: Not on file    Relationship status: Not  on file  . Intimate partner violence:    Fear of current or ex partner: Not on file    Emotionally abused: Not on file    Physically abused: Not on file    Forced sexual activity: Not on file  Other Topics Concern  . Not on file  Social History Narrative  . Not on file     Allergies  Allergen Reactions  . Tetracyclines & Related Nausea Only     Outpatient Medications Prior to Visit  Medication Sig Dispense Refill  . ARIPiprazole (ABILIFY) 5 MG  tablet Take 5 mg by mouth daily.    . busPIRone (BUSPAR) 15 MG tablet Take 15 mg by mouth. 1 tab qam, 2 tabs qpm    . carbamazepine (TEGRETOL) 200 MG tablet Take 2 tablets in the am and 2 tablets in the pm    . CARVEDILOL PO Take 1 tablet by mouth 2 (two) times daily.    . clonazePAM (KLONOPIN) 0.5 MG tablet Take 0.5 mg by mouth 2 (two) times daily.    Marland Kitchen escitalopram (LEXAPRO) 20 MG tablet Take 20 mg by mouth daily.    . fluticasone (FLONASE) 50 MCG/ACT nasal spray SHAKE LIQUID AND USE 2 SPRAYS IN EACH NOSTRIL DAILY 48 g 0  . hydrocortisone (CORTEF) 10 MG tablet 2 tablets in the am and 1 tablet in the pm--Addison's Disease 270 tablet 1  . L-Methylfolate (DEPLIN PO) Take by mouth daily.    . lansoprazole (PREVACID) 15 MG capsule Take 1 capsule (15 mg total) by mouth daily. 90 capsule 1  . levocetirizine (XYZAL) 5 MG tablet TAKE 1 TABLET(5 MG) BY MOUTH EVERY EVENING 30 tablet 3  . losartan (COZAAR) 25 MG tablet Take 25 mg by mouth daily.    . memantine (NAMENDA) 10 MG tablet Take 10 mg by mouth 2 (two) times daily.    . mirtazapine (REMERON) 45 MG tablet Take 90 mg by mouth at bedtime.     . riTUXimab (RITUXAN) 100 MG/10ML injection Inject into the vein every 6 (six) months.    . rosuvastatin (CRESTOR) 5 MG tablet Take 5 mg by mouth daily.    Marland Kitchen valproic acid (DEPAKENE) 250 MG capsule Take 3 capsules in the am and 3 capsules in the pm    . warfarin (COUMADIN) 5 MG tablet 5 mg at bedtime. As directed     . busPIRone (BUSPAR) 7.5 MG tablet Take  2 tablets in the am and 2 tablets in the pm     No facility-administered medications prior to visit.     Review of Systems  Constitutional: Negative for fever and weight loss.  HENT: Negative for congestion, ear pain, nosebleeds and sore throat.   Eyes: Negative for redness.  Respiratory: Positive for cough, shortness of breath and wheezing.   Cardiovascular: Negative for palpitations, leg swelling and PND.  Gastrointestinal: Negative for nausea and vomiting.  Genitourinary: Negative for dysuria.  Skin: Negative for rash.  Neurological: Negative for headaches.  Endo/Heme/Allergies: Does not bruise/bleed easily.  Psychiatric/Behavioral: Negative for depression. The patient is not nervous/anxious.       Objective:  Physical Exam   Vitals:   10/15/18 1609  BP: 126/68  Pulse: 82  SpO2: 97%  Weight: 246 lb (111.6 kg)  Height: 5' 11.5" (1.816 m)    Gen: well appearing, no acute distress HENT: Thick neck, NCAT, OP clear, neck supple without masses Eyes: PERRL, EOMi Lymph: no cervical lymphadenopathy PULM: CTA B CV: RRR, no mgr, no JVD GI: BS+, soft, nontender, no hsm Derm: no rash or skin breakdown MSK: normal bulk and tone Neuro: A&Ox4, CN II-XII intact, strength 5/5 in all 4 extremities Psyche: normal mood and affect   CBC No results found for: WBC, RBC, HGB, HCT, PLT, MCV, MCH, MCHC, RDW, LYMPHSABS, MONOABS, EOSABS, BASOSABS   Chest imaging: 2017 CT chest images independently reviewed showing a spiculated nodule in the right hilum, some mild pleural thickening bilaterally, nonspecific interstitial changes which is mild and in the periphery of the bases of both lungs but no intralobular septal thickening, no  traction bronchiectasis, most of the pulmonary parenchyma is within normal limits. 2019 chest x-ray images independently reviewed showing a pacemaker in place and a significantly elevated right hemidiaphragm.  PFT: September 2019 [facility]: Ratio 74%, FVC  3.49 L 71% predicted, total lung capacity 7.43 L 72% predicted, DLCO 18.90m 72% predicted  6-minute walk test: [place], lowest O2 saturation 92% on Room Air  Labs:  Path: August 2019 BAL [facility] left lower lobe cytology showed benign bronchial cells and pulmonary macrophages, special stains negative, cell count showed no predominant white blood cell line, transbronchial biopsy showed fragments of lung tissue with granulomatous and organizing pneumonia changes  Microbiology: August 2019 BAL AFB negative, fungus negative, bacteria viral panel negative  Echo:  Heart Catheterization:   [facility] reviewed where he was seen in the setting of shortness of breath.  At that time lung function testing demonstrated a minimal diffusion abnormality, his oximetry was normal in clinic, there were plans made to taper off his prednisone which was being used presumably for pneumonitis of his lung.    Assessment & Plan:   Other fatigue - Plan: Cpap titration  Dyspnea, unspecified type - Plan: Pulmonary function test  Seasonal allergic rhinitis due to pollen  OSA on CPAP  Autoimmune encephalomyelitis  Physical deconditioning  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[facility]  Discussion: Mr. MReephas a complex medical history and comes to my clinic today for evaluation of ongoing shortness of breath.  He has many reasons for this and fortunately does not have evidence of worsening hypoxemia based on our assessment.  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  I think the low hanging fruit is to optimize his CPAP treatment with a CPAP titration study and to get him exercising more.  In regards to the organizing pneumonia/interstitial changes  seen on the CT scan of his chest I would love to see the most recent CT images so that I can see if it appears to have progressed compared to previous, and I like to repeat a CT scan in March to compare [facility]    If there is evidence that the nonspecific groundglass changes have progressed and we do not think it is malignancy then I think he will need to be treated with additional doses of prednisone for organizing pneumonia again.  I am concerned about the right sided pulmonary nodule which is increased in size as this is previously been treated as metastatic thymic carcinoma.  For the increasing pulmonary nodule in your lungs: -[facility]  Fatigue during the daytime, ongoing snoring in the setting of obstructive sleep apnea treatment on CPAP: -We will repeat a CPAP titration study to make sure we have the pressure right  Dyspnea on exertion/deconditioning: -Start exercising regularly, you should make it a goal to walk at a pace that makes you short of breath for 20 minutes a day, strength training twice a week  Right-sided hemidiaphragm elevation on chest imaging: -Try to lose weight as best as possible -The following behaviors have been associated with weight loss: Weigh yourself daily Write down everything you eat Drink a glass of  water prior to eating a meal Only eat when you are hungry Buy food from the periphery of the grocery store, not the middle  Abnormal CT scan of the chest/question of pneumonitis versus interstitial lung disease: -We will repeat a lung function test in March to compare to the one performed in September at [facility] -I am eager to see the images from the most recent CT scan of your chest, I will request these from [place] to be transferred here so that I can review  Question of hypoxemia with exertion: This was proven to not be the  case by Dr. Corrin Parker at [place] using a finger O2 oximeter as these will give you abnormal readings.  Follow-up with me in [place] in March.  > 50% of this 60 min visit spent face to face    Current Outpatient Medications:  .  ARIPiprazole (ABILIFY) 5 MG tablet, Take 5 mg by mouth daily., Disp: , Rfl:  .  busPIRone (BUSPAR) 15 MG tablet, Take 15 mg by mouth. 1 tab qam, 2 tabs qpm, Disp: , Rfl:  .  carbamazepine (TEGRETOL) 200 MG tablet, Take 2 tablets in the am and 2 tablets in the pm, Disp: , Rfl:  .  CARVEDILOL PO, Take 1 tablet by mouth 2 (two) times daily., Disp: , Rfl:  .  clonazePAM (KLONOPIN) 0.5 MG tablet, Take 0.5 mg by mouth 2 (two) times daily., Disp: , Rfl:  .  escitalopram (LEXAPRO) 20 MG tablet, Take 20 mg by mouth daily., Disp: , Rfl:  .  fluticasone (FLONASE) 50 MCG/ACT nasal spray, SHAKE LIQUID AND USE 2 SPRAYS IN EACH NOSTRIL DAILY, Disp: 48 g, Rfl: 0 .  hydrocortisone (CORTEF) 10 MG tablet, 2 tablets in the am and 1 tablet in the pm--Addison's Disease, Disp: 270 tablet, Rfl: 1 .  L-Methylfolate (DEPLIN PO), Take by mouth daily., Disp: , Rfl:  .  lansoprazole (PREVACID) 15 MG capsule, Take 1 capsule (15 mg total) by mouth daily., Disp: 90 capsule, Rfl: 1 .  levocetirizine (XYZAL) 5 MG tablet, TAKE 1 TABLET(5 MG) BY MOUTH EVERY EVENING, Disp: 30 tablet, Rfl: 3 .  losartan (COZAAR) 25 MG tablet, Take 25 mg by mouth daily., Disp: , Rfl:  .  memantine (NAMENDA) 10 MG tablet, Take 10 mg by mouth 2 (two) times daily., Disp: , Rfl:  .  mirtazapine (REMERON) 45 MG tablet, Take 90 mg by mouth at bedtime. , Disp: , Rfl:  .  riTUXimab (RITUXAN) 100 MG/10ML injection, Inject into the vein every 6 (six) months., Disp: , Rfl:  .  rosuvastatin (CRESTOR) 5 MG tablet, Take 5 mg by mouth daily., Disp: , Rfl:  .  valproic acid (DEPAKENE) 250 MG capsule, Take 3 capsules in the am and 3 capsules in the pm, Disp: , Rfl:  .  warfarin (COUMADIN) 5 MG tablet, 5 mg at bedtime. As directed ,  Disp: , Rfl:

## 2018-10-15 NOTE — Patient Instructions (Signed)
For the increasing pulmonary nodule in your lungs: -keep the appointment for the repeat CT scan in April at [facility] as arranged by your oncologist there  Fatigue during the daytime, ongoing snoring in the setting of obstructive sleep apnea treatment on CPAP: -We will repeat a CPAP titration study to make sure we have the pressure right  Dyspnea on exertion/deconditioning: -Start exercising regularly, you should make it a goal to walk at a pace that makes you short of breath for 20 minutes a day, strength training twice a week  Right-sided hemidiaphragm elevation on chest imaging: -Try to lose weight as best as possible -The following behaviors have been associated with weight loss: Weigh yourself daily Write down everything you eat Drink a glass of water prior to eating a meal Only eat when you are hungry Buy food from the periphery of the grocery store, not the middle  Abnormal CT scan of the chest/question of pneumonitis versus interstitial lung disease: -We will repeat a lung function test in March to compare to the one performed in September at [facility] -I am eager to see the images from the most recent CT scan of your chest, I will request these from [place] to be transferred here so that I can review  Question of hypoxemia with exertion: This was proven to not be the case by Dr. Corrin Parker at [place] using a finger O2 oximeter as these will give you abnormal readings.  Follow-up with me in [place] in March.

## 2018-10-30 DIAGNOSIS — Z5181 Encounter for therapeutic drug level monitoring: Secondary | ICD-10-CM | POA: Diagnosis not present

## 2018-10-30 DIAGNOSIS — Z86718 Personal history of other venous thrombosis and embolism: Secondary | ICD-10-CM | POA: Diagnosis not present

## 2018-10-30 DIAGNOSIS — Z7901 Long term (current) use of anticoagulants: Secondary | ICD-10-CM | POA: Diagnosis not present

## 2018-10-30 DIAGNOSIS — D6861 Antiphospholipid syndrome: Secondary | ICD-10-CM | POA: Diagnosis not present

## 2018-11-04 ENCOUNTER — Ambulatory Visit (INDEPENDENT_AMBULATORY_CARE_PROVIDER_SITE_OTHER): Payer: Medicare Other | Admitting: Family Medicine

## 2018-11-04 ENCOUNTER — Encounter: Payer: Self-pay | Admitting: Family Medicine

## 2018-11-04 VITALS — BP 130/80 | HR 87 | Temp 98.1°F | Ht 72.0 in | Wt 249.4 lb

## 2018-11-04 DIAGNOSIS — M545 Low back pain, unspecified: Secondary | ICD-10-CM

## 2018-11-04 MED ORDER — PREDNISONE 20 MG PO TABS: 40.0000 mg | ORAL_TABLET | Freq: Every day | ORAL | 0 refills | Status: AC

## 2018-11-04 MED ORDER — TIZANIDINE HCL 4 MG PO TABS: 4.0000 mg | ORAL_TABLET | Freq: Four times a day (QID) | ORAL | 0 refills | Status: DC | PRN

## 2018-11-04 NOTE — Patient Instructions (Signed)
Heat (pad or rice pillow in microwave) over affected area, 10-15 minutes twice daily.   OK to take Tylenol 1000 mg (2 extra strength tabs) or 975 mg (3 regular strength tabs) every 6 hours as needed.  Aim to do some physical exertion for 150 minutes per week. This is typically divided into 5 days per week, 30 minutes per day. The activity should be enough to get your heart rate up. Anything is better than nothing if you have time constraints.  EXERCISES  RANGE OF MOTION (ROM) AND STRETCHING EXERCISES - Low Back Pain Most people with lower back pain will find that their symptoms get worse with excessive bending forward (flexion) or arching at the lower back (extension). The exercises that will help resolve your symptoms will focus on the opposite motion.  If you have pain, numbness or tingling which travels down into your buttocks, leg or foot, the goal of the therapy is for these symptoms to move closer to your back and eventually resolve. Sometimes, these leg symptoms will get better, but your lower back pain may worsen. This is often an indication of progress in your rehabilitation. Be very alert to any changes in your symptoms and the activities in which you participated in the 24 hours prior to the change. Sharing this information with your caregiver will allow him or her to most efficiently treat your condition. These exercises may help you when beginning to rehabilitate your injury. Your symptoms may resolve with or without further involvement from your physician, physical therapist or athletic trainer. While completing these exercises, remember:   Restoring tissue flexibility helps normal motion to return to the joints. This allows healthier, less painful movement and activity.  An effective stretch should be held for at least 30 seconds.  A stretch should never be painful. You should only feel a gentle lengthening or release in the stretched tissue. FLEXION RANGE OF MOTION AND STRETCHING  EXERCISES:  STRETCH - Flexion, Single Knee to Chest   Lie on a firm bed or floor with both legs extended in front of you.  Keeping one leg in contact with the floor, bring your opposite knee to your chest. Hold your leg in place by either grabbing behind your thigh or at your knee.  Pull until you feel a gentle stretch in your low back. Hold 30 seconds.  Slowly release your grasp and repeat the exercise with the opposite side. Repeat 2 times. Complete this exercise 3 times per week.   STRETCH - Flexion, Double Knee to Chest  Lie on a firm bed or floor with both legs extended in front of you.  Keeping one leg in contact with the floor, bring your opposite knee to your chest.  Tense your stomach muscles to support your back and then lift your other knee to your chest. Hold your legs in place by either grabbing behind your thighs or at your knees.  Pull both knees toward your chest until you feel a gentle stretch in your low back. Hold 30 seconds.  Tense your stomach muscles and slowly return one leg at a time to the floor. Repeat 2 times. Complete this exercise 3 times per week.   STRETCH - Low Trunk Rotation  Lie on a firm bed or floor. Keeping your legs in front of you, bend your knees so they are both pointed toward the ceiling and your feet are flat on the floor.  Extend your arms out to the side. This will stabilize your upper body  by keeping your shoulders in contact with the floor.  Gently and slowly drop both knees together to one side until you feel a gentle stretch in your low back. Hold for 30 seconds.  Tense your stomach muscles to support your lower back as you bring your knees back to the starting position. Repeat the exercise to the other side. Repeat 2 times. Complete this exercise at least 3 times per week.   EXTENSION RANGE OF MOTION AND FLEXIBILITY EXERCISES:  STRETCH - Extension, Prone on Elbows   Lie on your stomach on the floor, a bed will be too soft.  Place your palms about shoulder width apart and at the height of your head.  Place your elbows under your shoulders. If this is too painful, stack pillows under your chest.  Allow your body to relax so that your hips drop lower and make contact more completely with the floor.  Hold this position for 30 seconds.  Slowly return to lying flat on the floor. Repeat 2 times. Complete this exercise 3 times per week.   RANGE OF MOTION - Extension, Prone Press Ups  Lie on your stomach on the floor, a bed will be too soft. Place your palms about shoulder width apart and at the height of your head.  Keeping your back as relaxed as possible, slowly straighten your elbows while keeping your hips on the floor. You may adjust the placement of your hands to maximize your comfort. As you gain motion, your hands will come more underneath your shoulders.  Hold this position 30 seconds.  Slowly return to lying flat on the floor. Repeat 2 times. Complete this exercise 3 times per week.   RANGE OF MOTION- Quadruped, Neutral Spine   Assume a hands and knees position on a firm surface. Keep your hands under your shoulders and your knees under your hips. You may place padding under your knees for comfort.  Drop your head and point your tailbone toward the ground below you. This will round out your lower back like an angry cat. Hold this position for 30 seconds.  Slowly lift your head and release your tail bone so that your back sags into a large arch, like an old horse.  Hold this position for 30 seconds.  Repeat this until you feel limber in your low back.  Now, find your "sweet spot." This will be the most comfortable position somewhere between the two previous positions. This is your neutral spine. Once you have found this position, tense your stomach muscles to support your low back.  Hold this position for 30 seconds. Repeat 2 times. Complete this exercise 3 times per week.   STRENGTHENING  EXERCISES - Low Back Sprain These exercises may help you when beginning to rehabilitate your injury. These exercises should be done near your "sweet spot." This is the neutral, low-back arch, somewhere between fully rounded and fully arched, that is your least painful position. When performed in this safe range of motion, these exercises can be used for people who have either a flexion or extension based injury. These exercises may resolve your symptoms with or without further involvement from your physician, physical therapist or athletic trainer. While completing these exercises, remember:   Muscles can gain both the endurance and the strength needed for everyday activities through controlled exercises.  Complete these exercises as instructed by your physician, physical therapist or athletic trainer. Increase the resistance and repetitions only as guided.  You may experience muscle soreness or fatigue,  but the pain or discomfort you are trying to eliminate should never worsen during these exercises. If this pain does worsen, stop and make certain you are following the directions exactly. If the pain is still present after adjustments, discontinue the exercise until you can discuss the trouble with your caregiver.  STRENGTHENING - Deep Abdominals, Pelvic Tilt   Lie on a firm bed or floor. Keeping your legs in front of you, bend your knees so they are both pointed toward the ceiling and your feet are flat on the floor.  Tense your lower abdominal muscles to press your low back into the floor. This motion will rotate your pelvis so that your tail bone is scooping upwards rather than pointing at your feet or into the floor. With a gentle tension and even breathing, hold this position for 3 seconds. Repeat 2 times. Complete this exercise 3 times per week.   STRENGTHENING - Abdominals, Crunches   Lie on a firm bed or floor. Keeping your legs in front of you, bend your knees so they are both pointed  toward the ceiling and your feet are flat on the floor. Cross your arms over your chest.  Slightly tip your chin down without bending your neck.  Tense your abdominals and slowly lift your trunk high enough to just clear your shoulder blades. Lifting higher can put excessive stress on the lower back and does not further strengthen your abdominal muscles.  Control your return to the starting position. Repeat 2 times. Complete this exercise 3 times per week.   STRENGTHENING - Quadruped, Opposite UE/LE Lift   Assume a hands and knees position on a firm surface. Keep your hands under your shoulders and your knees under your hips. You may place padding under your knees for comfort.  Find your neutral spine and gently tense your abdominal muscles so that you can maintain this position. Your shoulders and hips should form a rectangle that is parallel with the floor and is not twisted.  Keeping your trunk steady, lift your right hand no higher than your shoulder and then your left leg no higher than your hip. Make sure you are not holding your breath. Hold this position for 30 seconds.  Continuing to keep your abdominal muscles tense and your back steady, slowly return to your starting position. Repeat with the opposite arm and leg. Repeat 2 times. Complete this exercise 3 times per week.   STRENGTHENING - Abdominals and Quadriceps, Straight Leg Raise   Lie on a firm bed or floor with both legs extended in front of you.  Keeping one leg in contact with the floor, bend the other knee so that your foot can rest flat on the floor.  Find your neutral spine, and tense your abdominal muscles to maintain your spinal position throughout the exercise.  Slowly lift your straight leg off the floor about 6 inches for a count of 3, making sure to not hold your breath.  Still keeping your neutral spine, slowly lower your leg all the way to the floor. Repeat this exercise with each leg 2 times. Complete this  exercise 3 times per week.  POSTURE AND BODY MECHANICS CONSIDERATIONS - Low Back Sprain Keeping correct posture when sitting, standing or completing your activities will reduce the stress put on different body tissues, allowing injured tissues a chance to heal and limiting painful experiences. The following are general guidelines for improved posture.  While reading these guidelines, remember:  The exercises prescribed by your provider  will help you have the flexibility and strength to maintain correct postures.  The correct posture provides the best environment for your joints to work. All of your joints have less wear and tear when properly supported by a spine with good posture. This means you will experience a healthier, less painful body.  Correct posture must be practiced with all of your activities, especially prolonged sitting and standing. Correct posture is as important when doing repetitive low-stress activities (typing) as it is when doing a single heavy-load activity (lifting).  RESTING POSITIONS Consider which positions are most painful for you when choosing a resting position. If you have pain with flexion-based activities (sitting, bending, stooping, squatting), choose a position that allows you to rest in a less flexed posture. You would want to avoid curling into a fetal position on your side. If your pain worsens with extension-based activities (prolonged standing, working overhead), avoid resting in an extended position such as sleeping on your stomach. Most people will find more comfort when they rest with their spine in a more neutral position, neither too rounded nor too arched. Lying on a non-sagging bed on your side with a pillow between your knees, or on your back with a pillow under your knees will often provide some relief. Keep in mind, being in any one position for a prolonged period of time, no matter how correct your posture, can still lead to stiffness.  PROPER SITTING  POSTURE In order to minimize stress and discomfort on your spine, you must sit with correct posture. Sitting with good posture should be effortless for a healthy body. Returning to good posture is a gradual process. Many people can work toward this most comfortably by using various supports until they have the flexibility and strength to maintain this posture on their own. When sitting with proper posture, your ears will fall over your shoulders and your shoulders will fall over your hips. You should use the back of the chair to support your upper back. Your lower back will be in a neutral position, just slightly arched. You may place a small pillow or folded towel at the base of your lower back for  support.  When working at a desk, create an environment that supports good, upright posture. Without extra support, muscles tire, which leads to excessive strain on joints and other tissues. Keep these recommendations in mind:  CHAIR:  A chair should be able to slide under your desk when your back makes contact with the back of the chair. This allows you to work closely.  The chair's height should allow your eyes to be level with the upper part of your monitor and your hands to be slightly lower than your elbows.  BODY POSITION  Your feet should make contact with the floor. If this is not possible, use a foot rest.  Keep your ears over your shoulders. This will reduce stress on your neck and low back.  INCORRECT SITTING POSTURES  If you are feeling tired and unable to assume a healthy sitting posture, do not slouch or slump. This puts excessive strain on your back tissues, causing more damage and pain. Healthier options include:  Using more support, like a lumbar pillow.  Switching tasks to something that requires you to be upright or walking.  Talking a brief walk.  Lying down to rest in a neutral-spine position.  PROLONGED STANDING WHILE SLIGHTLY LEANING FORWARD  When completing a task  that requires you to lean forward while standing in one  place for a long time, place either foot up on a stationary 2-4 inch high object to help maintain the best posture. When both feet are on the ground, the lower back tends to lose its slight inward curve. If this curve flattens (or becomes too large), then the back and your other joints will experience too much stress, tire more quickly, and can cause pain.  CORRECT STANDING POSTURES Proper standing posture should be assumed with all daily activities, even if they only take a few moments, like when brushing your teeth. As in sitting, your ears should fall over your shoulders and your shoulders should fall over your hips. You should keep a slight tension in your abdominal muscles to brace your spine. Your tailbone should point down to the ground, not behind your body, resulting in an over-extended swayback posture.   INCORRECT STANDING POSTURES  Common incorrect standing postures include a forward head, locked knees and/or an excessive swayback. WALKING Walk with an upright posture. Your ears, shoulders and hips should all line-up.  PROLONGED ACTIVITY IN A FLEXED POSITION When completing a task that requires you to bend forward at your waist or lean over a low surface, try to find a way to stabilize 3 out of 4 of your limbs. You can place a hand or elbow on your thigh or rest a knee on the surface you are reaching across. This will provide you more stability, so that your muscles do not tire as quickly. By keeping your knees relaxed, or slightly bent, you will also reduce stress across your lower back. CORRECT LIFTING TECHNIQUES  DO :  Assume a wide stance. This will provide you more stability and the opportunity to get as close as possible to the object which you are lifting.  Tense your abdominals to brace your spine. Bend at the knees and hips. Keeping your back locked in a neutral-spine position, lift using your leg muscles. Lift with your  legs, keeping your back straight.  Test the weight of unknown objects before attempting to lift them.  Try to keep your elbows locked down at your sides in order get the best strength from your shoulders when carrying an object.     Always ask for help when lifting heavy or awkward objects. INCORRECT LIFTING TECHNIQUES DO NOT:   Lock your knees when lifting, even if it is a small object.  Bend and twist. Pivot at your feet or move your feet when needing to change directions.  Assume that you can safely pick up even a paperclip without proper posture.

## 2018-11-04 NOTE — Progress Notes (Signed)
Musculoskeletal Exam  Patient: Robert Kirby DOB: June 15, 1951  DOS: 11/04/2018  SUBJECTIVE:  Chief Complaint:   Chief Complaint  Patient presents with  . Back Pain    Robert Kirby is a 68 y.o.  male for evaluation and treatment of his back pain.   Onset:  2 months ago. No inj or change in activity- has been more sedentary.  Location: lower left Character:  aching  Progression of issue:  has worsened Associated symptoms: none Denies bowel/bladder incontinence or weakness Treatment: to date has been rest.   Neurovascular symptoms: no  ROS: Musculoskeletal/Extremities: +back pain Neurologic: no numbness, tingling no weakness   Past Medical History:  Diagnosis Date  . Addison's disease ([place])   . Antiphospholipid antibody syndrome ([place])   . Arrhythmia    Pacemaker  . Autoimmune encephalomyelitis   . Depression   . GERD (gastroesophageal reflux disease)   . History of blood transfusion    13 units from a GI Bleed.   Marland Kitchen History of GI bleed   . OSA on CPAP   . Sleep apnea    on CPAP  . 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 No acute distress. HENT: Normocephalic, atraumatic.  Thorax & Lungs:  No accessory muscle use Extremities: No clubbing. No cyanosis. No edema.  Skin: Warm. Dry. No erythema. No rash.  Musculoskeletal: low back.   Tenderness to palpation: no Deformity: no Ecchymosis: no Straight leg test: negative for Poor hamstring flexibility b/l. Neurologic: Normal sensory function. No focal deficits noted. DTR's equal and symmetry in LE's. No clonus. Psychiatric: Normal mood. Age appropriate judgment and insight. Alert & oriented x 3.    Assessment:  Left-sided low back pain without sciatica,  unspecified chronicity - Plan: predniSONE (DELTASONE) 20 MG tablet, tiZANidine (ZANAFLEX) 4 MG tablet  Plan: Orders as above. Pred burst. Avoid NSAIDs due to Coumadin. Stretches/exercises, heat, ice, Tylenol. Needs to lose wt. F/u prn. The patient voiced understanding and agreement to the plan.   [place], DO 11/04/18  9:22 AM

## 2018-11-16 DIAGNOSIS — Z7901 Long term (current) use of anticoagulants: Secondary | ICD-10-CM | POA: Diagnosis not present

## 2018-11-17 DIAGNOSIS — Z5181 Encounter for therapeutic drug level monitoring: Secondary | ICD-10-CM | POA: Diagnosis not present

## 2018-11-17 DIAGNOSIS — Z86718 Personal history of other venous thrombosis and embolism: Secondary | ICD-10-CM | POA: Diagnosis not present

## 2018-11-19 DIAGNOSIS — G0481 Other encephalitis and encephalomyelitis: Secondary | ICD-10-CM | POA: Diagnosis not present

## 2018-11-19 DIAGNOSIS — G0490 Encephalitis and encephalomyelitis, unspecified: Secondary | ICD-10-CM | POA: Diagnosis not present

## 2018-11-19 DIAGNOSIS — R569 Unspecified convulsions: Secondary | ICD-10-CM | POA: Diagnosis not present

## 2018-11-21 ENCOUNTER — Ambulatory Visit (HOSPITAL_BASED_OUTPATIENT_CLINIC_OR_DEPARTMENT_OTHER): Payer: Medicare Other | Attending: Pulmonary Disease | Admitting: Pulmonary Disease

## 2018-11-21 VITALS — Ht 72.0 in | Wt 240.0 lb

## 2018-11-21 DIAGNOSIS — Z9989 Dependence on other enabling machines and devices: Secondary | ICD-10-CM

## 2018-11-21 DIAGNOSIS — R5383 Other fatigue: Secondary | ICD-10-CM | POA: Insufficient documentation

## 2018-11-21 DIAGNOSIS — G4733 Obstructive sleep apnea (adult) (pediatric): Secondary | ICD-10-CM

## 2018-11-27 DIAGNOSIS — G4730 Sleep apnea, unspecified: Secondary | ICD-10-CM | POA: Diagnosis not present

## 2018-11-27 DIAGNOSIS — G4733 Obstructive sleep apnea (adult) (pediatric): Secondary | ICD-10-CM | POA: Diagnosis not present

## 2018-11-27 NOTE — Procedures (Signed)
Patient Name: Robert Kirby, Robert Kirby Date: 11/21/2018 Gender: Male D.O.B: [birth year] Age (years): 67 Referring Provider: Simonne Maffucci Height (inches): 72 Interpreting Physician: Kara Mead MD, ABSM Weight (lbs): 240 RPSGT: [place], [place] BMI: 33 MRN: [id number] Neck Size: 19.75 <br> <br> CLINICAL INFORMATION The patient is referred for a repeat CPAP titration to treat sleep apnea.    Date of NPSG, Split Night or HST: at [facility], unavailable  SLEEP STUDY TECHNIQUE As per the AASM Manual for the Scoring of Sleep and Associated Events v2.3 (April 2016) with a hypopnea requiring 4% desaturations.  The channels recorded and monitored were frontal, central and occipital EEG, electrooculogram (EOG), submentalis EMG (chin), nasal and oral airflow, thoracic and abdominal wall motion, anterior tibialis EMG, snore microphone, electrocardiogram, and pulse oximetry. Continuous positive airway pressure (CPAP) was initiated at the beginning of the study and titrated to treat sleep-disordered breathing.  MEDICATIONS Medications self-administered by patient taken the night of the study : MELATONIN  TECHNICIAN COMMENTS Comments added by technician: The patient slept in room 4 and took Melatonin 10 mg per his usual. Comments added by scorer: N/A   RESPIRATORY PARAMETERS Optimal PAP Pressure (cm): 13 AHI at Optimal Pressure (/hr): 0.0 Overall Minimal O2 (%): 84.0 Supine % at Optimal Pressure (%): 100 Minimal O2 at Optimal Pressure (%): 93.0   SLEEP ARCHITECTURE The study was initiated at 10:14:08 PM and ended at 5:06:27 AM.  Sleep onset time was 5.4 minutes and the sleep efficiency was 85.1%%. The total sleep time was 351 minutes.  The patient spent 26.2%% of the night in stage N1 sleep, 66.2%% in stage N2 sleep, 2.3%% in stage N3 and 5.3% in REM.Stage REM latency was 217.0 minutes  Wake after sleep onset was 55.9. Alpha intrusion was absent. Supine sleep was 85.47%.  CARDIAC DATA The 2  lead EKG demonstrated sinus rhythm. The mean heart rate was 64.7 beats per minute. Other EKG findings include: None.   LEG MOVEMENT DATA The total Periodic Limb Movements of Sleep (PLMS) were 0. The PLMS index was 0.0. A PLMS index of <15 is considered normal in adults.  IMPRESSIONS - The optimal PAP pressure was 13 cm of water. - Central sleep apnea was not noted during this titration (CAI = 0.2/h). - Moderate oxygen desaturations were observed during this titration (min O2 = 84.0%). - The patient snored with moderate snoring volume during this titration study. - No cardiac abnormalities were observed during this study. - Clinically significant periodic limb movements were not noted during this study. Arousals associated with PLMs were rare.   DIAGNOSIS - Obstructive Sleep Apnea (327.23 [G47.33 ICD-10])   RECOMMENDATIONS - Trial of CPAP therapy on 13 cm H2O with a Medium size Fisher&Paykel Full Face Mask  and heated humidification. - Avoid alcohol, sedatives and other CNS depressants that may worsen sleep apnea and disrupt normal sleep architecture. - Sleep hygiene should be reviewed to assess factors that may improve sleep quality. - Weight management and regular exercise should be initiated or continued. - Return to [facility] Center for re-evaluation after 4 weeks of therapy   Kara Mead MD Board Certified in [place]

## 2018-11-27 NOTE — Procedures (Deleted)
    NAME: Robert Kirby DATE OF BIRTH:  [birth year] MEDICAL RECORD NUMBER [id number]  LOCATION: [facility]  PHYSICIAN: Leanna Sato. Fergus Throne  DATE OF STUDY: 11/21/2018  SLEEP STUDY TYPE: Out of Center Sleep Test                REFERRING PHYSICIAN: Juanito Doom, MD  INDICATION FOR STUDY: ***  EPWORTH SLEEPINESS SCORE:   HEIGHT: 6' (182.9 cm)  WEIGHT: 240 lb (108.9 kg)    Body mass index is 32.55 kg/m.  NECK SIZE: 19.75 in.  MEDICATIONS: ***  IMPRESSION:  ***    RECOMMENDATION:  ***   [name] V. Clyde Lundborg, Tax adviser of Sleep Medicine  ELECTRONICALLY SIGNED ON:  11/27/2018, 1:51 PM [place] PH: [phone]   FX: [phone] [place]

## 2018-11-30 ENCOUNTER — Telehealth: Payer: Self-pay | Admitting: Pulmonary Disease

## 2018-11-30 DIAGNOSIS — Z9989 Dependence on other enabling machines and devices: Principal | ICD-10-CM

## 2018-11-30 DIAGNOSIS — G4733 Obstructive sleep apnea (adult) (pediatric): Secondary | ICD-10-CM

## 2018-11-30 NOTE — Telephone Encounter (Signed)
Notes recorded by Robert Doom, MD on 11/30/2018 at 9:24 AM EDT A, Please let the patient know this showed sleep apnea and we should try CPAP at 13 cm H20. Please prescribe. Thanks, B ____________________________________  Robert Kirby and spoke with patients wife, she is aware of results and verbalized understanding. Order sent. Nothing further needed.

## 2018-11-30 NOTE — Telephone Encounter (Signed)
Called candace back, unable to reach left message to give [place] a call back.

## 2018-12-11 ENCOUNTER — Ambulatory Visit (INDEPENDENT_AMBULATORY_CARE_PROVIDER_SITE_OTHER): Payer: Medicare Other | Admitting: Pulmonary Disease

## 2018-12-11 ENCOUNTER — Encounter: Payer: Self-pay | Admitting: Pulmonary Disease

## 2018-12-11 ENCOUNTER — Ambulatory Visit: Payer: Medicare Other

## 2018-12-11 ENCOUNTER — Other Ambulatory Visit: Payer: Self-pay

## 2018-12-11 VITALS — Ht 71.5 in | Wt 250.0 lb

## 2018-12-11 DIAGNOSIS — J301 Allergic rhinitis due to pollen: Secondary | ICD-10-CM | POA: Diagnosis not present

## 2018-12-11 DIAGNOSIS — R0600 Dyspnea, unspecified: Secondary | ICD-10-CM

## 2018-12-11 DIAGNOSIS — G4733 Obstructive sleep apnea (adult) (pediatric): Secondary | ICD-10-CM

## 2018-12-11 DIAGNOSIS — G0481 Other encephalitis and encephalomyelitis: Secondary | ICD-10-CM

## 2018-12-11 DIAGNOSIS — Z9989 Dependence on other enabling machines and devices: Secondary | ICD-10-CM | POA: Diagnosis not present

## 2018-12-11 DIAGNOSIS — C37 Malignant neoplasm of thymus: Secondary | ICD-10-CM | POA: Diagnosis not present

## 2018-12-11 DIAGNOSIS — R918 Other nonspecific abnormal finding of lung field: Secondary | ICD-10-CM | POA: Diagnosis not present

## 2018-12-11 DIAGNOSIS — R5381 Other malaise: Secondary | ICD-10-CM | POA: Diagnosis not present

## 2018-12-11 LAB — PULMONARY FUNCTION TEST
DL/VA % PRED: 114 %
DL/VA: 4.65 ml/min/mmHg/L
DLCO unc % pred: 62 %
DLCO unc: 17.27 ml/min/mmHg
FEF 25-75 POST: 2.66 L/s
FEF 25-75 Pre: 1.84 L/sec
FEF2575-%Change-Post: 44 %
FEF2575-%PRED-POST: 96 %
FEF2575-%Pred-Pre: 66 %
FEV1-%Change-Post: 9 %
FEV1-%Pred-Post: 57 %
FEV1-%Pred-Pre: 52 %
FEV1-Post: 2.05 L
FEV1-Pre: 1.86 L
FEV1FVC-%Change-Post: 5 %
FEV1FVC-%Pred-Pre: 109 %
FEV6-%Change-Post: 4 %
FEV6-%Pred-Post: 52 %
FEV6-%Pred-Pre: 50 %
FEV6-Post: 2.4 L
FEV6-Pre: 2.3 L
FEV6FVC-%Change-Post: 0 %
FEV6FVC-%Pred-Post: 105 %
FEV6FVC-%Pred-Pre: 105 %
FVC-%Change-Post: 4 %
FVC-%PRED-POST: 50 %
FVC-%PRED-PRE: 48 %
FVC-Post: 2.4 L
FVC-Pre: 2.3 L
PRE FEV6/FVC RATIO: 100 %
Post FEV1/FVC ratio: 85 %
Post FEV6/FVC ratio: 100 %
Pre FEV1/FVC ratio: 81 %
RV % pred: 65 %
RV: 1.61 L
TLC % pred: 54 %
TLC: 4 L

## 2018-12-11 NOTE — Patient Instructions (Signed)
Obstructive sleep apnea: Continue CPAP 13 cm water at night  Nonspecific interstitial pneumonitis: This is the medical term for the inflammatory condition in your lung I would be very interested to see the results of the repeat CT scan you have arranged for April at [facility] Please call me after this result has been sent to your inbox so that I can review the report through epic Today's lung function testing showed some slight difference compared to [facility] but I think this is just a difference in the way the machines measure your parameters.  If he have worsening shortness of breath let me know  Shortness of breath: I think it is very important for you to start exercising to try to lose weight and to get stronger We will check your oxygen level with a forehead probe while walking today  History of malignancy, pulmonary nodules: Follow-up CT scan report from [facility]  Right-sided diaphragm elevation/weakness: Try to keep your weight down as much as possible  We will see you back in 6 months or sooner if needed

## 2018-12-11 NOTE — Progress Notes (Addendum)
Synopsis: Referred to [facility] in 09/2018 for dyspnea.  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  This was treated with prednisone.  He has followed at [facility] and lives in [place].  Subjective:   PATIENT ID: Robert Kirby GENDER: male DOB: 1950-11-19, MRN: [id number]   HPI  Chief Complaint  Patient presents with  . Follow-up    2 month follow up for OSA and PFT results.    He returns to clinic today after he had his CPAP titration study as well as his lung function test today.  He says that after we adjusted the pressure on his CPAP to 13 cm of water he feels much better.  He thinks he may even have a little bit more improvement with his breathing during the daytime.  He certainly has not worsened.  His wife says that he is not exercising.  He says that he does not walk because of back pain.  He had a lung function test today but he has not had a CT scan of his chest yet.  Past Medical History:  Diagnosis Date  . Addison's disease ([place])   . Antiphospholipid antibody syndrome ([place])   . Arrhythmia    Pacemaker  . Autoimmune encephalomyelitis   . Depression   . GERD (gastroesophageal reflux disease)   . History of blood transfusion    13 units from a GI Bleed.   Marland Kitchen History of GI bleed   . OSA on CPAP   . Sleep apnea    on CPAP  . 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[place]) 2015     Review of Systems  Constitutional: Negative for fever and weight loss.  HENT: Negative for congestion, ear pain, nosebleeds and sore throat.   Eyes: Negative for redness.  Respiratory: Positive for cough, shortness of  breath and wheezing.   Cardiovascular: Negative for palpitations, leg swelling and PND.  Gastrointestinal: Negative for nausea and vomiting.  Genitourinary: Negative for dysuria.  Skin: Negative for rash.  Neurological: Negative for headaches.  Endo/Heme/Allergies: Does not bruise/bleed easily.  Psychiatric/Behavioral: Negative for depression. The patient is not nervous/anxious.       Objective:  Physical Exam   Vitals:   12/11/18 1550  SpO2: 95%  Weight: 250 lb (113.4 kg)  Height: 5' 11.5" (1.816 m)   Walked 500 feet on room air using an O2 saturation probe on his forehead showing that his O2 sat dropped no lower than 95% on room air  Gen: well appearing HENT: OP clear, thick neck, neck supple PULM: Diminished R base, crackles left base, normal percussion CV: RRR, no mgr, trace edema GI: BS+, soft, nontender Derm: no cyanosis or rash Psyche: normal mood and affect    CBC No results found for: WBC, RBC, HGB, HCT, PLT, MCV, MCH, MCHC, RDW, LYMPHSABS, MONOABS, EOSABS, BASOSABS   Chest imaging: 2017 CT chest images independently reviewed showing a spiculated nodule in the right hilum, some mild pleural thickening bilaterally, nonspecific interstitial changes which is mild and in the periphery of the bases of both lungs but no intralobular septal thickening, no traction bronchiectasis, most of the pulmonary parenchyma is within normal limits. 2019  chest x-ray images independently reviewed showing a pacemaker in place and a significantly elevated right hemidiaphragm.  PFT: September 2019 [facility]: Ratio 74%, FVC 3.49 L 71% predicted, total lung capacity 5.34 L 72% predicted, DLCO 18.2m 72% predicted 11/2018 PFT: Ratio 85%, FVC 2.40 L, total lung capacity 4.00 L 54% predicted, DLCO 17.3 mL 62% predicted  6-minute walk test: January 2019 [facility], lowest O2 saturation 92% on Room Air  Labs:  Path: August 2019 BAL [facility] left lower lobe  cytology showed benign bronchial cells and pulmonary macrophages, special stains negative, cell count showed no predominant white blood cell line, transbronchial biopsy showed fragments of lung tissue with granulomatous and organizing pneumonia changes  Microbiology: August 2019 BAL AFB negative, fungus negative, bacteria viral panel negative  Echo:  Heart Catheterization:  Sleep studies: 10/2018 CPAP titration: optimal pressure 13 cm H20, O2 saturation 84% nadir, time with O2 saturation <        Assessment & Plan:   OSA on CPAP  Dyspnea, unspecified type  Autoimmune encephalomyelitis  Physical deconditioning  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HCC)  Seasonal allergic rhinitis due to pollen  Pulmonary nodules  Discussion: Today's lung function testing shows significantly different numbers [facility], however I commonly see differences in reports with lung function testing between institutions.  Further, he reports today that he is never been in a plethysmography box, so I think they are perhaps [facility] lung [facility]  This could explain the differences in her numbers.    Obstructive sleep apnea: Continue CPAP 13 cm water at night  Nonspecific interstitial pneumonitis: This is the medical term for the inflammatory condition in your lung I would be very interested to see the results of the [facility] CT [facility] this result has been sent to your inbox so that I can review the report through epic Today[facility] this is just a difference in the way the machines measure your parameters.  If he have worsening shortness of breath let me know  Shortness of breath: I think it is very important for you to start exercising to try to lose weight and to get stronger We will check  your oxygen level with a forehead probe while walking today > normal today  History of Thymic Carcinoma, pulmonary nodules: Follow-up CT scan report from [facility]  Right-sided diaphragm elevation/weakness: Try to keep your weight down as much as possible  We will see you back in 6 months or sooner if needed  Greater than 50% of this 25-minute visit was spent face-to-face     Current Outpatient Medications:  .  ARIPiprazole (ABILIFY) 5 MG tablet, Take 5 mg by mouth daily., Disp: , Rfl:  .  busPIRone (BUSPAR) 15 MG tablet, Take 15 mg by mouth. 1 tab qam, 2 tabs qpm, Disp: , Rfl:  .  carbamazepine (TEGRETOL) 200 MG tablet, Take 2 tablets in the am and 2 tablets in the pm, Disp: , Rfl:  .  CARVEDILOL PO, Take 1 tablet by mouth 2 (two) times daily., Disp: , Rfl:  .  clonazePAM (KLONOPIN) 0.5 MG tablet, Take 0.5 mg by mouth 2 (two) times daily., Disp: , Rfl:  .  escitalopram (LEXAPRO) 20 MG tablet, Take 20 mg by mouth daily., Disp: , Rfl:  .  fluticasone (  FLONASE) 50 MCG/ACT nasal spray, SHAKE LIQUID AND USE 2 SPRAYS IN EACH NOSTRIL DAILY, Disp: 48 g, Rfl: 0 .  hydrocortisone (CORTEF) 10 MG tablet, 2 tablets in the am and 1 tablet in the pm--Addison's Disease, Disp: 270 tablet, Rfl: 1 .  L-Methylfolate (DEPLIN PO), Take by mouth daily., Disp: , Rfl:  .  lansoprazole (PREVACID) 15 MG capsule, Take 1 capsule (15 mg total) by mouth daily., Disp: 90 capsule, Rfl: 1 .  levocetirizine (XYZAL) 5 MG tablet, TAKE 1 TABLET(5 MG) BY MOUTH EVERY EVENING, Disp: 30 tablet, Rfl: 3 .  losartan (COZAAR) 25 MG tablet, Take 25 mg by mouth daily., Disp: , Rfl:  .  memantine (NAMENDA) 10 MG tablet, Take 10 mg by mouth 2 (two) times daily., Disp: , Rfl:  .  mirtazapine (REMERON) 45 MG tablet, Take 90 mg by mouth at bedtime. , Disp: , Rfl:  .  riTUXimab (RITUXAN) 100 MG/10ML injection, Inject into the vein every 6 (six) months., Disp: , Rfl:  .  rosuvastatin (CRESTOR) 5 MG tablet, Take 5 mg by mouth daily., Disp: ,  Rfl:  .  tiZANidine (ZANAFLEX) 4 MG tablet, Take 1 tablet (4 mg total) by mouth every 6 (six) hours as needed for muscle spasms., Disp: 21 tablet, Rfl: 0 .  valproic acid (DEPAKENE) 250 MG capsule, Take 3 capsules in the am and 3 capsules in the pm, Disp: , Rfl:  .  warfarin (COUMADIN) 5 MG tablet, 5 mg at bedtime. As directed , Disp: , Rfl:

## 2018-12-16 ENCOUNTER — Ambulatory Visit: Payer: Medicare Other

## 2018-12-16 ENCOUNTER — Other Ambulatory Visit: Payer: Self-pay | Admitting: Family Medicine

## 2018-12-17 DIAGNOSIS — F09 Unspecified mental disorder due to known physiological condition: Secondary | ICD-10-CM | POA: Diagnosis not present

## 2018-12-17 DIAGNOSIS — F0789 Other personality and behavioral disorders due to known physiological condition: Secondary | ICD-10-CM | POA: Diagnosis not present

## 2019-01-04 ENCOUNTER — Encounter: Payer: Self-pay | Admitting: Family Medicine

## 2019-01-04 ENCOUNTER — Other Ambulatory Visit: Payer: Self-pay

## 2019-01-04 ENCOUNTER — Ambulatory Visit (INDEPENDENT_AMBULATORY_CARE_PROVIDER_SITE_OTHER): Payer: Medicare Other | Admitting: Family Medicine

## 2019-01-04 DIAGNOSIS — G0481 Other encephalitis and encephalomyelitis: Secondary | ICD-10-CM

## 2019-01-04 DIAGNOSIS — M545 Low back pain, unspecified: Secondary | ICD-10-CM

## 2019-01-04 DIAGNOSIS — C37 Malignant neoplasm of thymus: Secondary | ICD-10-CM

## 2019-01-04 MED ORDER — TRAMADOL HCL 50 MG PO TABS: 50.0000 mg | ORAL_TABLET | Freq: Three times a day (TID) | ORAL | 0 refills | Status: DC | PRN

## 2019-01-04 NOTE — Progress Notes (Addendum)
Virtual Visit via Video Note  I connected with patient on 01/04/19 at  1:15 PM EDT by a video enabled telemedicine application and verified that I am speaking with the correct person using two identifiers.   I discussed the limitations of evaluation and management by telemedicine and the availability of in person appointments. The patient expressed understanding and agreed to proceed.  History of Present Illness: Cont to have low back pain. Seen in Feb, given medicine and stretches/exercises. Due to his encephalitis, he has memory issues and does not remember getting these and has not been doing them. Tramadol was helpful. No new s/s's.   Hx of autoimmune encephalitis. Interested in seeing OT at some point, after the pandemic ends.  Hx of thymic carcinoma. Has a port for meds. It needs to be flushed every 6 weeks. Looking for an area in town so he doesn't have to drive to [facility] every time to have it flushed.    Observations/Objective: No conversational dyspnea Age appropriate judgment and insight Nml affect and mood  Assessment and Plan: Left-sided low back pain without sciatica, unspecified chronicity - Plan: traMADol (ULTRAM) 50 MG tablet  Autoimmune encephalomyelitis  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[place]) - Plan: Ambulatory referral to Oncology  Orders as above. Warnings about narcotics provided. Cannot take NSAIDs 2/2 warfarin use. Tylenol, heat, stretches/exercises.  Will refer to OT whenever patient desires. Will investigate port flushing issue, likely needs to see onc here?  Follow Up Instructions: Let me know in 3 weeks if no better and we will set up with PT. Standing offer for OT. Refer to onc for port flushing, hopefully.    I discussed the assessment and treatment plan with the patient. The patient was provided an opportunity to ask questions and all were answered. The patient agreed with the plan and demonstrated an understanding of the instructions.   The patient was advised to call  back or seek an in-person evaluation if the symptoms worsen or if the condition fails to improve as anticipated.   [place], DO

## 2019-01-06 ENCOUNTER — Other Ambulatory Visit: Payer: Self-pay | Admitting: Family Medicine

## 2019-01-07 ENCOUNTER — Encounter: Payer: Self-pay | Admitting: *Deleted

## 2019-01-07 NOTE — Progress Notes (Signed)
Reached out to Hollace Hayward to introduce myself as the office RN Navigator and explain our new patient process. Spoke to the patient's wife because patient has history of encephalopathy and has issues with memory. Reviewed the reason for their referral and scheduled their new patient appointment along with labs. Provided address and directions to the office including call back phone number. Reviewed with patient any concerns they may have or any possible barriers to attending their appointment.   Patient is cared for at [facility] for thymic cancer. He has a port that needs routine maintenance and is not wanting to travel to [facility] every 3 weeks. Wife understands that patient needs to be an established patient and see one of our providers at least once per year. Patient will be seen at [facility] on 01/19/19 and will have a port flush then. Appointment will be scheduled approx 4-6 weeks out from that date.

## 2019-01-12 DIAGNOSIS — F0789 Other personality and behavioral disorders due to known physiological condition: Secondary | ICD-10-CM | POA: Diagnosis not present

## 2019-01-12 DIAGNOSIS — F09 Unspecified mental disorder due to known physiological condition: Secondary | ICD-10-CM | POA: Diagnosis not present

## 2019-01-19 DIAGNOSIS — G0481 Other encephalitis and encephalomyelitis: Secondary | ICD-10-CM | POA: Diagnosis not present

## 2019-01-19 DIAGNOSIS — M899 Disorder of bone, unspecified: Secondary | ICD-10-CM | POA: Diagnosis not present

## 2019-01-19 DIAGNOSIS — I429 Cardiomyopathy, unspecified: Secondary | ICD-10-CM | POA: Diagnosis not present

## 2019-01-19 DIAGNOSIS — J189 Pneumonia, unspecified organism: Secondary | ICD-10-CM | POA: Diagnosis not present

## 2019-01-19 DIAGNOSIS — I447 Left bundle-branch block, unspecified: Secondary | ICD-10-CM | POA: Diagnosis not present

## 2019-01-19 DIAGNOSIS — E2740 Unspecified adrenocortical insufficiency: Secondary | ICD-10-CM | POA: Diagnosis not present

## 2019-01-19 DIAGNOSIS — J90 Pleural effusion, not elsewhere classified: Secondary | ICD-10-CM | POA: Diagnosis not present

## 2019-01-19 DIAGNOSIS — C37 Malignant neoplasm of thymus: Secondary | ICD-10-CM | POA: Diagnosis not present

## 2019-01-19 DIAGNOSIS — F419 Anxiety disorder, unspecified: Secondary | ICD-10-CM | POA: Diagnosis not present

## 2019-01-19 DIAGNOSIS — R0609 Other forms of dyspnea: Secondary | ICD-10-CM | POA: Diagnosis not present

## 2019-01-19 DIAGNOSIS — R918 Other nonspecific abnormal finding of lung field: Secondary | ICD-10-CM | POA: Diagnosis not present

## 2019-01-19 DIAGNOSIS — R0602 Shortness of breath: Secondary | ICD-10-CM | POA: Diagnosis not present

## 2019-01-20 DIAGNOSIS — Z7901 Long term (current) use of anticoagulants: Secondary | ICD-10-CM | POA: Diagnosis not present

## 2019-01-26 ENCOUNTER — Other Ambulatory Visit: Payer: Self-pay

## 2019-01-26 ENCOUNTER — Encounter: Payer: Self-pay | Admitting: Family Medicine

## 2019-01-26 ENCOUNTER — Ambulatory Visit (INDEPENDENT_AMBULATORY_CARE_PROVIDER_SITE_OTHER): Payer: Medicare Other | Admitting: Family Medicine

## 2019-01-26 DIAGNOSIS — R1031 Right lower quadrant pain: Secondary | ICD-10-CM

## 2019-01-26 NOTE — Progress Notes (Signed)
Chief Complaint  Patient presents with  . Hernia    Subjective: Patient is a 68 y.o. male here for hernia. Due to COVID-19 pandemic, we are interacting via web portal for an electronic face-to-face visit. I verified patient's ID using 2 identifiers. Patient agreed to proceed with visit via this method. Patient is at home, I am at home. Patient and I are present for visit.   3 weeks ago, pt had a violent episode of coughing. Felt a strange sensation/pull in R groin region. Happened again a few days ago. No bowel changes, skin changes, bulges currently or fevers.   ROS: Const: no fevers  Past Medical History:  Diagnosis Date  . Addison's disease ([place])   . Antiphospholipid antibody syndrome ([place])   . Arrhythmia    Pacemaker  . Autoimmune encephalomyelitis   . Depression   . GERD (gastroesophageal reflux disease)   . History of blood transfusion    13 units from a GI Bleed.   Marland Kitchen History of GI bleed   . OSA on CPAP   . Sleep apnea    on CPAP  . 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[place]) 2015    Objective: No conversational dyspnea Age appropriate judgment and insight Nml affect and mood  Assessment and Plan: Groin discomfort, right  Whether it be an inguinal hernia or pulled muscle, we can side with caution and stick with supportive care. Warning signs and symptoms verbalized and written down in MyChart message to him summarizing his plan. F/u prn for this.  The patient voiced understanding and agreement to the plan.  [place], DO 01/26/19  1:53 PM

## 2019-02-01 DIAGNOSIS — Z950 Presence of cardiac pacemaker: Secondary | ICD-10-CM | POA: Diagnosis not present

## 2019-02-03 ENCOUNTER — Other Ambulatory Visit: Payer: Self-pay | Admitting: Family Medicine

## 2019-02-05 ENCOUNTER — Encounter: Payer: Self-pay | Admitting: Family Medicine

## 2019-02-18 DIAGNOSIS — F09 Unspecified mental disorder due to known physiological condition: Secondary | ICD-10-CM | POA: Diagnosis not present

## 2019-02-18 DIAGNOSIS — F0789 Other personality and behavioral disorders due to known physiological condition: Secondary | ICD-10-CM | POA: Diagnosis not present

## 2019-02-19 DIAGNOSIS — E7800 Pure hypercholesterolemia, unspecified: Secondary | ICD-10-CM | POA: Diagnosis not present

## 2019-02-22 ENCOUNTER — Telehealth: Payer: Self-pay | Admitting: Family

## 2019-02-22 NOTE — Telephone Encounter (Signed)
Called and spoke with patient regarding appointments rs/ from 6/2 due to [place] results pending

## 2019-02-23 ENCOUNTER — Ambulatory Visit: Payer: Medicare Other | Admitting: Family

## 2019-03-04 DIAGNOSIS — Z7901 Long term (current) use of anticoagulants: Secondary | ICD-10-CM | POA: Diagnosis not present

## 2019-03-08 ENCOUNTER — Inpatient Hospital Stay: Payer: Medicare Other

## 2019-03-08 ENCOUNTER — Other Ambulatory Visit: Payer: Self-pay

## 2019-03-08 ENCOUNTER — Telehealth: Payer: Self-pay | Admitting: Hematology & Oncology

## 2019-03-08 ENCOUNTER — Encounter: Payer: Self-pay | Admitting: *Deleted

## 2019-03-08 ENCOUNTER — Inpatient Hospital Stay: Payer: Medicare Other | Attending: Family | Admitting: Family

## 2019-03-08 VITALS — BP 114/80 | HR 90 | Temp 98.1°F | Resp 18 | Ht 72.0 in | Wt 248.1 lb

## 2019-03-08 DIAGNOSIS — Z7901 Long term (current) use of anticoagulants: Secondary | ICD-10-CM | POA: Diagnosis not present

## 2019-03-08 DIAGNOSIS — Z7951 Long term (current) use of inhaled steroids: Secondary | ICD-10-CM

## 2019-03-08 DIAGNOSIS — G4733 Obstructive sleep apnea (adult) (pediatric): Secondary | ICD-10-CM | POA: Insufficient documentation

## 2019-03-08 DIAGNOSIS — Z950 Presence of cardiac pacemaker: Secondary | ICD-10-CM | POA: Insufficient documentation

## 2019-03-08 DIAGNOSIS — C37 Malignant neoplasm of thymus: Secondary | ICD-10-CM | POA: Insufficient documentation

## 2019-03-08 DIAGNOSIS — R918 Other nonspecific abnormal finding of lung field: Secondary | ICD-10-CM | POA: Insufficient documentation

## 2019-03-08 DIAGNOSIS — Z920 Personal history of contraception: Secondary | ICD-10-CM | POA: Diagnosis not present

## 2019-03-08 DIAGNOSIS — F329 Major depressive disorder, single episode, unspecified: Secondary | ICD-10-CM | POA: Insufficient documentation

## 2019-03-08 DIAGNOSIS — Z86718 Personal history of other venous thrombosis and embolism: Secondary | ICD-10-CM | POA: Diagnosis not present

## 2019-03-08 DIAGNOSIS — D6861 Antiphospholipid syndrome: Secondary | ICD-10-CM | POA: Diagnosis not present

## 2019-03-08 DIAGNOSIS — Z79899 Other long term (current) drug therapy: Secondary | ICD-10-CM | POA: Insufficient documentation

## 2019-03-08 NOTE — Progress Notes (Signed)
Hematology/Oncology Consultation   Name: Robert Kirby      MRN: [id number]    Location: Room/bed info not found  Date: 03/08/2019 Time:10:48 AM   REFERRING PHYSICIAN: Riki Sheer, DO  REASON FOR CONSULT: Port flush every 6 weeks    DIAGNOSIS: Patient followed by [facility] Dr. Linzie Collin for metastatic thymic cancer [id number])  HISTORY OF PRESENT ILLNESS: Mr. Robert Kirby is a very pleasant 67 yo caucasian gentleman with metastatic thymic cancer with mediastinal mass and 3 right sided pulmonary nodules diagnosed in January 2015 during work up for GI bleed.  He completed 4 cycles of paclitaxel and Carboplatin at [facility] 2015) followed by 33 fractions of radiation to the mediastinum (July-August 2015). They are originally from [place] and moved to [place] to be closer th their grandchildren.  He developed pneumonitis after 1 cycle of Alimta in June 2019. He is currently off treatment and Dr. Sharlet Salina is monitoring for progression with scans.  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 There is about 20 years of time where he can not remember anything. He received 5 sessions of plasmapheresis and is still receiving Rituxan every 6 months. His next dose will be on July 9th during his routine follow-up.     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 He has a coagulation check monitor and checks his INR every 2 weeks and notifies Bennye Alm NP of the results. His current coumadin is 5 mg PO daily every except Wednesdays which are 2.5 mg PO.  His son has thyroid cancer. No other family history of cancer that he is aware of.   No fever, chills, n/v, cough, rash, dizziness, chest pain, palpitations, abdominal pain or changes in bowel or  bladder habits.  No tenderness, numbness or tingling in his extremities.  He has had swelling in the lower extremities and recently started Lasix with cardiology which has helped quite a bit. He has some mild puffiness around the ankle bones at this time. No pitting edema.  He has history of complete heart block and has a pacemaker in place.  No lymphadenopathy noted on exam.  He has a good appetite and is staying well hydrated. His weight is stable.  He states that every 5 months he and his best friend have a cigar on the back porch but he just "puffs".  He will have the occasionally glass of wine or mixed drink socially but not often.  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   ROS: All other 10 point review of systems is negative.   PAST MEDICAL HISTORY:   Past Medical History:  Diagnosis Date  . Addison's disease ([place])   . Antiphospholipid antibody syndrome ([place])   . Arrhythmia    Pacemaker  . Autoimmune encephalomyelitis   . Depression   . GERD (gastroesophageal reflux disease)   . History of blood transfusion    13 units from a GI Bleed.   Marland Kitchen History of GI bleed   . OSA on CPAP   . Sleep apnea    on CPAP  . 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[place]) 2015    ALLERGIES: Allergies  Allergen Reactions  . Tetracyclines & Related Nausea Only      MEDICATIONS:  Current Outpatient Medications on  File Prior to Visit  Medication Sig Dispense Refill  . ARIPiprazole (ABILIFY) 5 MG tablet Take 5 mg by mouth daily.    . busPIRone (BUSPAR) 15 MG tablet Take 15 mg by mouth. 1 tab qam, 2 tabs qpm    . carbamazepine (TEGRETOL) 200 MG tablet Take 2 tablets in the am and 2 tablets in the pm    . CARVEDILOL PO Take 1 tablet by mouth 2 (two) times daily.    . clonazePAM (KLONOPIN) 0.5 MG tablet Take 0.5 mg by mouth 2 (two) times daily.    Marland Kitchen escitalopram (LEXAPRO) 20 MG tablet Take 20 mg by mouth daily.    . fluticasone (FLONASE) 50 MCG/ACT nasal spray SHAKE LIQUID AND USE 2 SPRAYS IN EACH  NOSTRIL DAILY 48 g 0  . furosemide (LASIX) 20 MG tablet Take 1 tablet by mouth daily.    . hydrocortisone (CORTEF) 10 MG tablet TAKE 2 TABLETS IN THE MORNING AND 1 TABLET IN THE EVENING FOR ADDISONS DISEASE 270 tablet 3  . L-Methylfolate-Algae (DEPLIN 15) 15-90.314 MG CAPS Take 1 capsule by mouth daily.    . lansoprazole (PREVACID) 15 MG capsule TAKE 1 CAPSULE DAILY 90 capsule 3  . levocetirizine (XYZAL) 5 MG tablet TAKE 1 TABLET(5 MG) BY MOUTH EVERY EVENING 30 tablet 3  . losartan (COZAAR) 25 MG tablet Take 25 mg by mouth daily.    . memantine (NAMENDA) 10 MG tablet Take 10 mg by mouth 2 (two) times daily.     . mirtazapine (REMERON) 45 MG tablet Take 90 mg by mouth at bedtime.     . riTUXimab (RITUXAN) 100 MG/10ML injection Inject into the vein every 6 (six) months.    . rosuvastatin (CRESTOR) 5 MG tablet Take 5 mg by mouth daily.    Marland Kitchen valproic acid (DEPAKENE) 250 MG capsule Take 3 capsules in the am and 3 capsules in the pm    . warfarin (COUMADIN) 5 MG tablet 5 mg at bedtime. As directed     . tiZANidine (ZANAFLEX) 4 MG tablet Take 1 tablet (4 mg total) by mouth every 6 (six) hours as needed for muscle spasms. (Patient not taking: Reported on 03/08/2019) 21 tablet 0  . traMADol (ULTRAM) 50 MG tablet Take 1 tablet (50 mg total) by mouth every 8 (eight) hours as needed (Pain). (Patient not taking: Reported on 03/08/2019) 21 tablet 0   No current facility-administered medications on file prior to visit.      PAST SURGICAL HISTORY Past Surgical History:  Procedure Laterality Date  . CHOLECYSTECTOMY  2000  . COLONOSCOPY  2016   was told to have one in 3 years. From the [facility] [place] [facility] Dr. Amie Critchley  . ESOPHAGOGASTRODUODENOSCOPY     [place] different doctor least before 2014/15  . NOSE SURGERY     removed polyps from nose around 1996. Said he broke nose in 4 grade and needed surgery on it  . PACEMAKER PLACEMENT    . TONSILLECTOMY AND ADENOIDECTOMY  1959    FAMILY  HISTORY: Family History  Problem Relation Age of Onset  . Depression Mother   . High blood pressure Daughter   . Colon cancer Neg Hx   . Esophageal cancer Neg Hx     SOCIAL HISTORY:  reports that she has never smoked. She has never used smokeless tobacco. She reports current alcohol use. She reports that she does not use drugs.  PERFORMANCE STATUS: The patient's performance status is 1 - Symptomatic  but completely ambulatory  PHYSICAL EXAM: Most Recent Vital Signs: Blood pressure 114/80, pulse 90, temperature 98.1 F (36.7 C), temperature source Oral, resp. rate 18, height 6' (1.829 m), weight 248 lb 1.9 oz (112.5 kg), SpO2 98 %. BP 114/80 (BP Location: Right Arm, Patient Position: Sitting)   Pulse 90   Temp 98.1 F (36.7 C) (Oral)   Resp 18   Ht 6' (1.829 m)   Wt 248 lb 1.9 oz (112.5 kg)   SpO2 98%   BMI 33.65 kg/m   General Appearance:    Alert, cooperative, no distress, appears stated age  Head:    Normocephalic, without obvious abnormality, atraumatic  Eyes:    PERRL, conjunctiva/corneas clear, EOM's intact, fundi    benign, both eyes       Ears:    Normal TM's and external ear canals, both ears  Nose:   Nares normal, septum midline, mucosa normal, no drainage    or sinus tenderness  Throat:   Lips, mucosa, and tongue normal; teeth and gums normal  Neck:   Supple, symmetrical, trachea midline, no adenopathy;       thyroid:  No enlargement/tenderness/nodules; no carotid   bruit or JVD  Back:     Symmetric, no curvature, ROM normal, no CVA tenderness  Lungs:     Clear to auscultation bilaterally, respirations unlabored  Chest wall:    No tenderness or deformity  Heart:    Regular rate and rhythm, S1 and S2 normal, no murmur, rub   or gallop  Abdomen:     Soft, non-tender, bowel sounds active all four quadrants,    no masses, no organomegaly  Genitalia:    Normal male without lesion, discharge or tenderness  Rectal:    Normal tone, normal prostate, no masses or  tenderness;   guaiac negative stool  Extremities:   Extremities normal, atraumatic, no cyanosis or edema  Pulses:   2+ and symmetric all extremities  Skin:   Skin color, texture, turgor normal, no rashes or lesions  Lymph nodes:   Cervical, supraclavicular, and axillary nodes normal  Neurologic:   CNII-XII intact. Normal strength, sensation and reflexes      throughout    LABORATORY DATA:  No results found for this or any previous visit (from the past 48 hour(s)).    RADIOGRAPHY: No results found.     PATHOLOGY: [facility] testing: [id number] mutation and [id number] mutation  ASSESSMENT/PLAN: Mr. Dudek is a very pleasant 68 yo caucasian gentleman with metastatic thymic cancer ([id number]) followed by [facility] Dr. Linzie Collin.  He is getting established with [place] today for port flushes as we are closer to home for him. We can certainly help him with this.  He is not currently on treatment and is being monitored regularly with scans.  We will plan to see him back every 6 weeks for port flush with annual follow-up with one of our providers.    All questions were answered. He will contact our office with any questions or concerns. We can certainly see him sooner if needed.   He was discussed with and also seen by Dr. Marin Olp and he is in agreement with the aforementioned.   Laverna Peace, NP      Addendum: I saw and examined Mr. Bucklin with Jud[facility] Roch.  He obviously is getting incredibly good care out at [facility].  Given that Dr. Sharlet Salina is his doctor, he really does not get much better than him.  We will be more than happy to  do his port flushes here.  He only lives 5 minutes away from our office.  It would be so much more convenient for him to have his port flushes here.  He only gets the Rituxan every 6 months so if he wants to continue to have this at [facility] that would be fine.  If he ever wants to have Rituxan here in our office we could be available for him for this.  He clearly has a  very rare cancer, with that being thymic carcinoma.  I know this is quite aggressive.  I know he is a very hard time with treatment.  He really looks good right now so I will have to believe that the cancer, although present, probably is not that active right now.  We spent about 45 minutes with he and his wife.  They are both very very nice.  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  Again, I told he and his wife that if there are any problems that he can always call us and we can certainly see him and try to help out so that he does not have to make that our drive to [facility].  We will plan to see him back for his Port-A-Cath flushes every 6 weeks.  Lattie Haw, MD

## 2019-03-08 NOTE — Progress Notes (Signed)
Initial RN Navigator Patient Visit  Name: Robert Kirby Date of Referral : 01/07/19 Diagnosis: Thymic Carcinoma  Patient is actively treated at [facility] and seeking to establish care here for port maintenance and other simple interventions. Met with patient and wife to briefly explain my role, however at this point the patient has no active needs. They have my card to reach out with any potential issues, but I will not continue to actively navigate this patient.   Patient understands all follow up procedures and expectations. They have my number to reach out for any further clarification or additional needs.

## 2019-03-08 NOTE — Telephone Encounter (Signed)
Called and LMVM for patient with date/time of follow up appt/ He also has access to My Chart and will get updated schedule/ per 6/15 los

## 2019-03-08 NOTE — Patient Instructions (Signed)

## 2019-03-12 DIAGNOSIS — Z20828 Contact with and (suspected) exposure to other viral communicable diseases: Secondary | ICD-10-CM | POA: Diagnosis not present

## 2019-03-31 ENCOUNTER — Encounter: Payer: Self-pay | Admitting: Family Medicine

## 2019-03-31 ENCOUNTER — Other Ambulatory Visit: Payer: Self-pay

## 2019-03-31 ENCOUNTER — Ambulatory Visit (INDEPENDENT_AMBULATORY_CARE_PROVIDER_SITE_OTHER): Payer: Medicare Other | Admitting: Family Medicine

## 2019-03-31 VITALS — BP 108/72 | HR 96 | Temp 98.8°F | Ht 72.0 in | Wt 247.0 lb

## 2019-03-31 DIAGNOSIS — R0789 Other chest pain: Secondary | ICD-10-CM | POA: Diagnosis not present

## 2019-03-31 DIAGNOSIS — K4090 Unilateral inguinal hernia, without obstruction or gangrene, not specified as recurrent: Secondary | ICD-10-CM | POA: Diagnosis not present

## 2019-03-31 NOTE — Progress Notes (Signed)
Chief Complaint  Patient presents with  . Hernia    Subjective: Patient is a 68 y.o. male here for possible hernia.  Feeling "like insides are silling out" in R groin region. No pain. Does not feel a bulge, but does feel a presence. No fevers or bowel changes.  Notes a sharp pain in L upper chest, worse in AM when he wakes up, gets better though day. Denies injury. Did get new pillows in past 2 weeks. Ice and Tylenol somewhat helpful. Denies skin changes.   ROS: Const: no fevers  Past Medical History:  Diagnosis Date  . Addison's disease ([place])   . Antiphospholipid antibody syndrome ([place])   . Arrhythmia    Pacemaker  . Autoimmune encephalomyelitis   . Depression   . GERD (gastroesophageal reflux disease)   . History of blood transfusion    13 units from a GI Bleed.   Marland Kitchen History of GI bleed   . OSA on CPAP   . Sleep apnea    on CPAP  . 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  Lungs: CTAB, no rales, wheezes or rhonchi. No accessory muscle use GU: Bulge noted in inguinal canal that did enlarge with val salva.  Psych: Age appropriate judgment and insight, normal affect and mood  Assessment and Plan: Right inguinal hernia - Plan: offered referral to gen surg. Pt would like to hold off, if it becomes bothersome, will let me know.  Chest wall pain - Plan: likely injured himself, supportive care. Stretch.   F/u prn.  The patient voiced understanding and agreement to the plan.  [place], DO 03/31/19  3:45 PM

## 2019-03-31 NOTE — Patient Instructions (Signed)
Let me know if your hernia bothers you. I will refer you to a general surgeon. Just send me a MyChart message unless something drastically changes. This is a hernia that is not particularly bothersome or dangerous.   You have a bruise on your L chest wall. I believe you injured it. This could continue to improve over the next 1-2 weeks.   Ice/cold pack over area for 10-15 min twice daily.  OK to take Tylenol 1000 mg (2 extra strength tabs) or 975 mg (3 regular strength tabs) every 6 hours as needed.  Stretch the area.  Let us know if you need anything.

## 2019-04-01 DIAGNOSIS — G0481 Other encephalitis and encephalomyelitis: Secondary | ICD-10-CM | POA: Diagnosis not present

## 2019-04-02 ENCOUNTER — Telehealth: Payer: Self-pay | Admitting: Pulmonary Disease

## 2019-04-02 ENCOUNTER — Encounter: Payer: Self-pay | Admitting: Nurse Practitioner

## 2019-04-02 ENCOUNTER — Telehealth (INDEPENDENT_AMBULATORY_CARE_PROVIDER_SITE_OTHER): Payer: Medicare Other | Admitting: Nurse Practitioner

## 2019-04-02 VITALS — BP 132/83 | HR 98 | Temp 96.1°F

## 2019-04-02 DIAGNOSIS — R0609 Other forms of dyspnea: Secondary | ICD-10-CM

## 2019-04-02 DIAGNOSIS — M545 Low back pain, unspecified: Secondary | ICD-10-CM | POA: Insufficient documentation

## 2019-04-02 MED ORDER — TIZANIDINE HCL 4 MG PO TABS: 4.0000 mg | ORAL_TABLET | Freq: Four times a day (QID) | ORAL | 0 refills | Status: DC | PRN

## 2019-04-02 MED ORDER — PREDNISONE 10 MG PO TABS: 20.0000 mg | ORAL_TABLET | Freq: Every day | ORAL | 0 refills | Status: AC

## 2019-04-02 NOTE — Telephone Encounter (Signed)
Spoke with the pt's spouse  She states that the pt has been having increased SOB x 3 days  He was winded walking from bathroom to bedroom last night and sats dropped to 83% RA  He does have o2 at home  He woke up in the night and had 2 bouts of diarrhea  She states he has been having left side pain and has a bruise there- was seen by PCP regarding this last wk  She reports no sick contacts, fever, travel, family gatherings or being around crowds  Video visit at 11:30 with Kenney Houseman

## 2019-04-02 NOTE — Patient Instructions (Signed)
Muscle strain Left side: May alternate warm and cool compresses Will refill tizanidine - patient has taken before previously prescribed by PCP  Obstructive sleep apnea: Continue CPAP 13 cm water at night  Nonspecific interstitial pneumonitis: CT from [facility] in April showed Stable partially calcified anterior mediastinal mass, consistent with known thymic neoplasm. Increased bilateral pleural nodularity, consistent with worsening metastatic disease. Unchanged small right pleural effusion. Stable sclerotic lesions right seventh rib and sternum.  Shortness of breath: I think it is very important for you to start exercising to try to lose weight and to get stronger Will order prednisone taper  History of malignancy, pulmonary nodules: Follow-up CT performed by [facility]  Right-sided diaphragm elevation/weakness: Try to keep your weight down as much as possible  Follow up in 3-4 months with Dr. Lake Bells or sooner if needed

## 2019-04-02 NOTE — Progress Notes (Signed)
Virtual Visit via Video Note  I connected with Robert Kirby on 04/02/19 at 11:30 AM EDT by a video enabled telemedicine application and verified that I am speaking with the correct person using two identifiers.  Location: Patient: home Provider: office   I discussed the limitations of evaluation and management by telemedicine and the availability of in person appointments. The patient expressed understanding and agreed to proceed.  History of Present Illness: 68 year old male with OSA, thymic carcinoma, pneumonitis who is followed by Dr. Lake Bells.  Synopsis: Referred to [facility] in 09/2018 for dyspnea.  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  This was treated with prednisone.  He has followed at [facility] and lives in [place].  Patient has a tele-visit today for left side pain and shortness of breath.  Patient states that he has had increased shortness of breath for 3 days.  He states that he was walking to the bathroom last night and became short of breath.  He checked his O2 sats and was at 83% on room air.  Patient does have home O2 that he uses with exertion at 2 L.  His wife is a Marine scientist and took his vital signs during her visit today.  Vital signs were stable and O2 sats were at 93% on room air.  Patient states that over the past couple weeks he has been having left side pain.  He states that he was doing some housework and thinks that he may have pulled a muscle in his left side.  The pain has been worse over the past 3 days.  Patient admits that he has been more sedentary since this pandemic  started.  Wife reports that she walked patient this morning and sats did not drop below 90% on room air.  Patient thinks that at times he is anxious and this could also be causing some of his shortness of breath.  Patient does have a history of OSA and is on CPAP at night.  He states that he is compliant with CPAP and wears it every night.  Does benefit from usage. Denies f/c/s, n/v/d, hemoptysis, PND, leg swelling.  Patient states that he did have diarrhea last night but has not had any issues with this today.    Observations/Objective:  Today's Vitals   04/02/19 1503  BP: 132/83  Pulse: 98  Temp: (!) 96.1 F (35.6 C)  SpO2: 93%   There is no height or weight on file to calculate BMI.   CT chest 4/20: Stable partially calcified anterior mediastinal mass, consistent with known thymic neoplasm. Increased bilateral pleural nodularity, consistent with worsening metastatic disease. Unchanged small right pleural effusion. Stable sclerotic lesions right seventh rib and sternum.  Chest imaging: 2017 CT chest images independently reviewed showing a spiculated nodule in the right hilum, some mild pleural thickening bilaterally, nonspecific interstitial changes which is mild and in the periphery of the bases of both lungs but no intralobular septal thickening, no traction bronchiectasis, most of the pulmonary parenchyma is within normal limits. 2019 chest x-ray images independently reviewed showing a pacemaker in place and a significantly elevated right hemidiaphragm.  PFT: September 2019 [employer]: Ratio 74%, FVC 3.49 L 71% predicted, total lung capacity 5.34 L 72% predicted, DLCO 18.106m 72% predicted 11/2018 PFT: Ratio 85%, FVC 2.40 L, total lung capacity 4.00 L 54% predicted, DLCO 17.3 mL 62% predicted  6-minute walk test: [facility] 2019 [facility], lowest O2 saturation 92% on Room Air  Path: August 2019 BAL [facility] left lower lobe cytology showed benign bronchial  cells and pulmonary macrophages, special stains negative, cell count showed no predominant white blood cell line, transbronchial biopsy showed fragments of lung tissue with granulomatous and organizing pneumonia changes  Microbiology: August 2019 BAL AFB negative, fungus negative, bacteria viral panel negative  Sleep studies: 10/2018 CPAP titration: optimal pressure 13 cm H20, O2 saturation 84% nadir, time with O2 saturation <      Assessment and Plan: Patient complains today of left side muscle strain after housework.  Having increased shortness of breath with exertion over the past 3 days.  His O2 sats did drop last night to 83% on room air but have been stable and are stable today.  He does have 2 L of O2 that he can use with exertion.  Muscle strain Left side: May alternate warm and cool compresses Will refill tizanidine - patient has taken before previously prescribed by PCP  Obstructive sleep apnea: Continue CPAP 13 cm water at night  Nonspecific interstitial pneumonitis: CT from [facility] in April showed Stable partially calcified anterior mediastinal mass, consistent with known thymic neoplasm. Increased bilateral pleural nodularity, consistent with worsening metastatic disease. Unchanged small right pleural effusion. Stable sclerotic lesions right seventh rib and sternum.  Shortness of breath: I think it is very important for you to start exercising to try to lose weight and to get stronger Will order prednisone taper  History of malignancy, pulmonary nodules: Follow-up CT performed by [facility]  Right-sided diaphragm elevation/weakness: Try to keep your weight down as much as possible   Follow Up Instructions: Follow up in 3-4 months with Dr. MLake Bellsor sooner if needed    I discussed the assessment and treatment plan with the patient. The patient was provided an opportunity to ask questions and all were answered. The patient agreed with the plan and demonstrated an  understanding of the instructions.   The patient was advised to call back or seek an in-person evaluation if the symptoms worsen or if the condition fails to improve as anticipated.  I provided 23 minutes of non-face-to-face time during this encounter.   TFenton Foy NP

## 2019-04-02 NOTE — Assessment & Plan Note (Signed)
Patient complains today of left side muscle strain after housework.  Having increased shortness of breath with exertion over the past 3 days.  His O2 sats did drop last night to 83% on room air but have been stable and are stable today.  He does have 2 L of O2 that he can use with exertion.  Muscle strain Left side: May alternate warm and cool compresses Will refill tizanidine - patient has taken before previously prescribed by PCP  Obstructive sleep apnea: Continue CPAP 13 cm water at night  Nonspecific interstitial pneumonitis: CT from [facility] in April showed Stable partially calcified anterior mediastinal mass, consistent with known thymic neoplasm. Increased bilateral pleural nodularity, consistent with worsening metastatic disease. Unchanged small right pleural effusion. Stable sclerotic lesions right seventh rib and sternum.  Shortness of breath: I think it is very important for you to start exercising to try to lose weight and to get stronger Will order prednisone taper  History of malignancy, pulmonary nodules: Follow-up CT performed by [facility]  Right-sided diaphragm elevation/weakness: Try to keep your weight down as much as possible  Follow up in 3-4 months with Dr. Lake Bells or sooner if needed

## 2019-04-03 DIAGNOSIS — Z1159 Encounter for screening for other viral diseases: Secondary | ICD-10-CM | POA: Diagnosis not present

## 2019-04-03 NOTE — Progress Notes (Signed)
Reviewed, agree 

## 2019-04-05 ENCOUNTER — Other Ambulatory Visit: Payer: Self-pay

## 2019-04-05 ENCOUNTER — Ambulatory Visit (INDEPENDENT_AMBULATORY_CARE_PROVIDER_SITE_OTHER): Payer: Medicare Other | Admitting: Family Medicine

## 2019-04-05 ENCOUNTER — Encounter: Payer: Self-pay | Admitting: Family Medicine

## 2019-04-05 DIAGNOSIS — R197 Diarrhea, unspecified: Secondary | ICD-10-CM

## 2019-04-05 DIAGNOSIS — R0600 Dyspnea, unspecified: Secondary | ICD-10-CM | POA: Diagnosis not present

## 2019-04-05 NOTE — Progress Notes (Signed)
CC: Diarrhea  Robert Kirby is 68 y.o. male here for complaint of diarrhea. Due to COVID-19 pandemic, we are interacting via web portal for an electronic face-to-face visit. I verified patient's ID using 2 identifiers. Patient agreed to proceed with visit via this method. Patient is at home, I am at office. Patient, his wife Freida Busman and I are present for visit.   Duration: 4 days Abdominal pain? No Bleeding? No Recent travel? No Recent antibiotic use? No Sick contacts? No Fevers? No  +worsening sob-a/w his thymic carcinoma, on 2 L T[place]. Started 1 d after rituximab infusion. Feels like it is getting more normal. No N/V.  ROS:  GI: As noted in HPI Const: Denies fevers  Past Medical History:  Diagnosis Date  . Addison's disease ([place])   . Antiphospholipid antibody syndrome ([place])   . Arrhythmia    Pacemaker  . Autoimmune encephalomyelitis   . Depression   . GERD (gastroesophageal reflux disease)   . History of blood transfusion    13 units from a GI Bleed.   Marland Kitchen History of GI bleed   . OSA on CPAP   . Sleep apnea    on CPAP  . 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 Push fluids w electrolytes. May be improving. Cont supp care.  Dyspnea, unspecified type - Plan: Cont 2 L [place], wean as able. Wife is nurse, can help. If worsening, go to ER. If not getting better, contact Pulm.   Encouraged adequate hydration with water and electrolyte replacement. Warning signs and symptoms verbalized.  F/u prn. The patient and his wife voiced understanding and agreement to the plan.  [place], DO 04/05/19 2:26 PM

## 2019-04-08 ENCOUNTER — Encounter: Payer: Self-pay | Admitting: Family Medicine

## 2019-04-19 ENCOUNTER — Inpatient Hospital Stay: Payer: Medicare Other | Attending: Family

## 2019-04-19 ENCOUNTER — Other Ambulatory Visit: Payer: Self-pay

## 2019-04-19 VITALS — BP 128/73 | HR 82 | Temp 98.0°F

## 2019-04-19 DIAGNOSIS — C37 Malignant neoplasm of thymus: Secondary | ICD-10-CM | POA: Insufficient documentation

## 2019-04-19 DIAGNOSIS — Z452 Encounter for adjustment and management of vascular access device: Secondary | ICD-10-CM | POA: Insufficient documentation

## 2019-04-19 DIAGNOSIS — Z95828 Presence of other vascular implants and grafts: Secondary | ICD-10-CM

## 2019-04-19 MED ORDER — SODIUM CHLORIDE 0.9% FLUSH
10.0000 mL | INTRAVENOUS | Status: DC | PRN
  Administered 2019-04-19: 10 mL via INTRAVENOUS
  Filled 2019-04-19: qty 10

## 2019-04-19 MED ORDER — HEPARIN SOD (PORK) LOCK FLUSH 100 UNIT/ML IV SOLN
500.0000 [IU] | Freq: Once | INTRAVENOUS | Status: AC
  Administered 2019-04-19: 500 [IU] via INTRAVENOUS
  Filled 2019-04-19: qty 5

## 2019-04-19 NOTE — Patient Instructions (Signed)
Implanted Port Insertion, Care After This sheet gives you information about how to care for yourself after your procedure. Your health care provider may also give you more specific instructions. If you have problems or questions, contact your health care provider. What can I expect after the procedure? After the procedure, it is common to have:  Discomfort at the port insertion site.  Bruising on the skin over the port. This should improve over 3-4 days. Follow these instructions at home: Port care  After your port is placed, you will get a manufacturer's information card. The card has information about your port. Keep this card with you at all times.  Take care of the port as told by your health care provider. Ask your health care provider if you or a family member can get training for taking care of the port at home. A home health care nurse may also take care of the port.  Make sure to remember what type of port you have. Incision care      Follow instructions from your health care provider about how to take care of your port insertion site. Make sure you: ? Wash your hands with soap and water before and after you change your bandage (dressing). If soap and water are not available, use hand sanitizer. ? Change your dressing as told by your health care provider. ? Leave stitches (sutures), skin glue, or adhesive strips in place. These skin closures may need to stay in place for 2 weeks or longer. If adhesive strip edges start to loosen and curl up, you may trim the loose edges. Do not remove adhesive strips completely unless your health care provider tells you to do that.  Check your port insertion site every day for signs of infection. Check for: ? Redness, swelling, or pain. ? Fluid or blood. ? Warmth. ? Pus or a bad smell. Activity  Return to your normal activities as told by your health care provider. Ask your health care provider what activities are safe for you.  Do not  lift anything that is heavier than 10 lb (4.5 kg), or the limit that you are told, until your health care provider says that it is safe. General instructions  Take over-the-counter and prescription medicines only as told by your health care provider.  Do not take baths, swim, or use a hot tub until your health care provider approves. Ask your health care provider if you may take showers. You may only be allowed to take sponge baths.  Do not drive for 24 hours if you were given a sedative during your procedure.  Wear a medical alert bracelet in case of an emergency. This will tell any health care providers that you have a port.  Keep all follow-up visits as told by your health care provider. This is important. Contact a health care provider if:  You cannot flush your port with saline as directed, or you cannot draw blood from the port.  You have a fever or chills.  You have redness, swelling, or pain around your port insertion site.  You have fluid or blood coming from your port insertion site.  Your port insertion site feels warm to the touch.  You have pus or a bad smell coming from the port insertion site. Get help right away if:  You have chest pain or shortness of breath.  You have bleeding from your port that you cannot control. Summary  Take care of the port as told by your health   care provider. Keep the manufacturer's information card with you at all times.  Change your dressing as told by your health care provider.  Contact a health care provider if you have a fever or chills or if you have redness, swelling, or pain around your port insertion site.  Keep all follow-up visits as told by your health care provider. This information is not intended to replace advice given to you by your health care provider. Make sure you discuss any questions you have with your health care provider. Document Released: 06/30/2013 Document Revised: 04/07/2018 Document Reviewed: 04/07/2018  Elsevier Patient Education  2020 Elsevier Inc.  

## 2019-04-20 DIAGNOSIS — I420 Dilated cardiomyopathy: Secondary | ICD-10-CM | POA: Diagnosis not present

## 2019-04-20 DIAGNOSIS — I428 Other cardiomyopathies: Secondary | ICD-10-CM | POA: Diagnosis not present

## 2019-04-20 DIAGNOSIS — E7800 Pure hypercholesterolemia, unspecified: Secondary | ICD-10-CM | POA: Diagnosis not present

## 2019-04-20 DIAGNOSIS — I447 Left bundle-branch block, unspecified: Secondary | ICD-10-CM | POA: Diagnosis not present

## 2019-04-20 DIAGNOSIS — R0600 Dyspnea, unspecified: Secondary | ICD-10-CM | POA: Diagnosis not present

## 2019-04-22 DIAGNOSIS — Z7901 Long term (current) use of anticoagulants: Secondary | ICD-10-CM | POA: Diagnosis not present

## 2019-04-26 ENCOUNTER — Telehealth: Payer: Self-pay | Admitting: Nurse Practitioner

## 2019-04-26 ENCOUNTER — Telehealth (INDEPENDENT_AMBULATORY_CARE_PROVIDER_SITE_OTHER): Payer: Medicare Other | Admitting: Nurse Practitioner

## 2019-04-26 DIAGNOSIS — R0609 Other forms of dyspnea: Secondary | ICD-10-CM | POA: Diagnosis not present

## 2019-04-26 DIAGNOSIS — R0602 Shortness of breath: Secondary | ICD-10-CM | POA: Diagnosis not present

## 2019-04-26 NOTE — Telephone Encounter (Signed)
Please see if you can schedule an e-visit with one of he APP's for this afternoon. Thanks.

## 2019-04-26 NOTE — Telephone Encounter (Signed)
Seen by Lazaro Arms recently , please address .

## 2019-04-26 NOTE — Telephone Encounter (Signed)
Primary Pulmonologist: BQ Last office visit and with whom: 12/11/2018 with BQ What do we see them for (pulmonary problems): OSA/dyspnea Last OV assessment/plan: Instructions    Return in about 6 months (around 06/13/2019). Obstructive sleep apnea: Continue CPAP 13 cm water at night  Nonspecific interstitial pneumonitis: This is the medical term for the inflammatory condition in your lung I would be very interested to see the results of the repeat CT scan you have arranged for April at [facility] Please call me after this result has been sent to your inbox so that I can review the report through epic Today's lung function testing showed some slight difference compared to [facility] but I think this is just a difference in the way the machines measure your parameters.  If he have worsening shortness of breath let me know  Shortness of breath: I think it is very important for you to start exercising to try to lose weight and to get stronger We will check your oxygen level with a forehead probe while walking today  History of malignancy, pulmonary nodules: Follow-up CT scan report from [facility]  Right-sided diaphragm elevation/weakness: Try to keep your weight down as much as possible  We will see you back in 6 months or sooner if needed     Was appointment offered to patient (explain)?  Pt and wife want recommendations   Reason for call: called and spoke with pt's wife Robert Kirby who stated due to pt's carcinoma, it has caused autoimmune encephalitis. Robert Kirby also stated that one side of pt's diaphragm is higher than the other due to all the radiation that pt has had.   Robert Kirby stated that pt is always complaining of SOB and pt also checks his O2 sats numerous times during the day. Pt has gone awhile without using the O2 but over the last 3 weeks, pt has had to use the O2 24/7 due to his SOB. Pt is on 2.5L but Saturday, 8/1 pt felt lightheaded while in the shower and felt like he was going to pass out  and pt's O2 sats were 79% on room air due to pt being in the shower. After 15 minutes after having O2 back on, pt's sats finally recovered.  Pt went to [facility] 3 days ago and saw cardiologist and had echo performed which was abn but stable and also had EKG performed which was abn but stable.  Robert Kirby wants recommendations due to pt feeling like he is unable to get his breath. Robert Kirby states pt's O2 is ranging 92-94 at rest with O2 on and could also be like that without O2 on at rest but when pt goes to ambulate walking from the living room to kitchen pt will not have issues with SOB but if pt goes from living room to garage, pt will begin to huff and puff trying to get his breath. Sometimes this is with the O2 on and sometimes it is with the O2 off.  Pt was tested for COVID about 3 weeks ago due to pt having diarrhea and was also convinced that he had COVID. Test results came back negative.  Pt denies any complaints of cough, no loss of smell or loss of taste/loss of smell, no headache, no abdominal or muscle pain, no fever, no sore throat, no rash, no nausea/vomiting.  [name], please advise on all this for pt and wife Robert Kirby. Thanks!

## 2019-04-26 NOTE — Telephone Encounter (Signed)
Called and spoke with pt's wife [name] letting her know that TN said to schedule e-visit to further evaluate. [name] expressed understanding. Pt has been scheduled for visit this afternoon, 8/3 at 4:30 with TN. Nothing further needed.

## 2019-04-26 NOTE — Progress Notes (Signed)
Virtual Visit via Video Note  I connected with Robert Kirby on 04/26/19 at  4:30 PM EDT by a video enabled telemedicine application and verified that I am speaking with the correct person using two identifiers.  Location: Patient: home Provider: office   I discussed the limitations of evaluation and management by telemedicine and the availability of in person appointments. The patient expressed understanding and agreed to proceed.  History of Present Illness: 68 year old male with OSA, thymic carcinoma, pneumonitis who is followed by Dr. Lake Bells.  Synopsis: Referred to [facility] in 09/2018 for dyspnea. 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 This was treated with prednisone. He has followed at [facility] and lives in [place].  Patient has a tele-visit today for an acute visit.  Continues to complain of shortness of breath with exertion.  He states that his O2 sats are dropping at times to 83% on room air.  Patient does have home O2 that he uses with exertion at 2 L.  He does not always use this when he is exerting himself.  His wife is a Marine scientist and took his vital signs today his vital signs are stable O2 sats are stable today with 2 L of oxygen.  Patient thinks that at times he is anxious and this can also be causing some shortness of breath.  Patient does have a history of OSA and is on CPAP at night.  He states that he is compliant with CPAP and wears it nightly. Denies f/c/s, n/v/d, hemoptysis, PND, leg swelling.    Observations/Objective: CT chest 4/20: Stable partially calcified anterior  mediastinal mass, consistent with known thymic neoplasm. Increased bilateral pleural nodularity, consistent with worsening metastatic disease. Unchanged small right pleural effusion. Stable sclerotic lesions right seventh rib and sternum.  Chest imaging: 2017 CT chest images independently reviewed showing a spiculated nodule in the right hilum, some mild pleural thickening bilaterally, nonspecific interstitial changes which is mild and in the periphery of the bases of both lungs but no intralobular septal thickening, no traction bronchiectasis, most of the pulmonary parenchyma is within normal limits. 2019 chest x-ray images independently reviewed showing a pacemaker in place and a significantly elevated right hemidiaphragm.  PFT: September 2019 [facility]: Ratio 74%, FVC 3.49 L 71% predicted, total lung capacity5.34L 72% predicted, DLCO 18.58m 72% predicted 3/2020PFT: Ratio 85%, FVC 2.40 L, total lung capacity 4.00 L 54% predicted, DLCO 17.3 mL 62% predicted  6-minute walk test: January 2019 [facility], lowest O2 saturation 92% on Room Air  Path: August 2019 BAL [facility] left lower lobe cytology showed benign bronchial cells and pulmonary macrophages, special stains negative, cell count showed no predominant white blood cell line, transbronchial biopsy showed fragments of lung tissue with granulomatous and organizing pneumonia changes  Microbiology: August 2019 BAL AFB negative, fungus negative, bacteria viral panel negative  Sleep studies: 10/2018 CPAP titration: optimal pressure 13 cm H20, O2 saturation 84% nadir, time with O2 saturation <    Assessment and Plan: Patient has a tele-visit today for an acute visit.  Continues to complain of shortness of breath with exertion.  He states that his O2 sats are dropping  at times to 83% on room air.  Patient does have home O2 that he uses with exertion at 2 L.  He does not always use this when he is exerting  himself.  His wife is a Marine scientist and took his vital signs today his vital signs are stable O2 sats are stable today with 2 L of oxygen.  Patient thinks that at times he is anxious and this can also be causing some shortness of breath.  Patient does have a history of OSA and is on CPAP at night.  He states that he is compliant with CPAP and wears it nightly. Denies f/c/s, n/v/d, hemoptysis, PND, leg swelling.  Obstructive sleep apnea: Continue CPAP 13 cm water at night  Nonspecific interstitial pneumonitis: CT from [facility] in April showed Stable partially calcified anterior mediastinal mass, consistent with known thymic neoplasm. Increased bilateral pleural nodularity, consistent with worsening metastatic disease. Unchanged small right pleural effusion. Stable sclerotic lesions right seventh rib and sternum.  Shortness of breath: I think it is very important for you to start exercising to try to lose weight and to get stronger Will order prednisone taper  History of malignancy, pulmonary nodules: Follow-up CT performed by [facility]  Right-sided diaphragm elevation/weakness: Try to keep your weight down as much as possible  Patient Instructions  Will order chest x ray  Continue to wear O2 with exertion to keep sats above 90% Continue current medications    Follow Up Instructions: Follow up as already schedule or sooner if needed   I discussed the assessment and treatment plan with the patient. The patient was provided an opportunity to ask questions and all were answered. The patient agreed with the plan and demonstrated an understanding of the instructions.   The patient was advised to call back or seek an in-person evaluation if the symptoms worsen or if the condition fails to improve as anticipated.  I provided 22 minutes of non-face-to-face time during this encounter.   Fenton Foy, NP

## 2019-04-27 ENCOUNTER — Ambulatory Visit (HOSPITAL_BASED_OUTPATIENT_CLINIC_OR_DEPARTMENT_OTHER)
Admission: RE | Admit: 2019-04-27 | Discharge: 2019-04-27 | Disposition: A | Discharge status: Home / Self Care | Payer: Medicare Other | Source: Ambulatory Visit | Attending: Nurse Practitioner | Admitting: Nurse Practitioner

## 2019-04-27 ENCOUNTER — Other Ambulatory Visit: Payer: Self-pay

## 2019-04-27 DIAGNOSIS — F0789 Other personality and behavioral disorders due to known physiological condition: Secondary | ICD-10-CM | POA: Diagnosis not present

## 2019-04-27 DIAGNOSIS — F331 Major depressive disorder, recurrent, moderate: Secondary | ICD-10-CM | POA: Diagnosis not present

## 2019-04-27 DIAGNOSIS — R0602 Shortness of breath: Secondary | ICD-10-CM | POA: Diagnosis not present

## 2019-04-27 DIAGNOSIS — F09 Unspecified mental disorder due to known physiological condition: Secondary | ICD-10-CM | POA: Diagnosis not present

## 2019-04-28 ENCOUNTER — Telehealth: Payer: Self-pay | Admitting: Nurse Practitioner

## 2019-04-28 ENCOUNTER — Encounter: Payer: Self-pay | Admitting: Nurse Practitioner

## 2019-04-28 NOTE — Assessment & Plan Note (Signed)
Patient has a tele-visit today for an acute visit.  Continues to complain of shortness of breath with exertion.  He states that his O2 sats are dropping at times to 83% on room air.  Patient does have home O2 that he uses with exertion at 2 L.  He does not always use this when he is exerting himself.  His wife is a Marine scientist and took his vital signs today his vital signs are stable O2 sats are stable today with 2 L of oxygen.  Patient thinks that at times he is anxious and this can also be causing some shortness of breath.  Patient does have a history of OSA and is on CPAP at night.  He states that he is compliant with CPAP and wears it nightly. Denies f/c/s, n/v/d, hemoptysis, PND, leg swelling.  Obstructive sleep apnea: Continue CPAP 13 cm water at night  Nonspecific interstitial pneumonitis: CT from [facility] in April showed Stable partially calcified anterior mediastinal mass, consistent with known thymic neoplasm. Increased bilateral pleural nodularity, consistent with worsening metastatic disease. Unchanged small right pleural effusion. Stable sclerotic lesions right seventh rib and sternum.  Shortness of breath: I think it is very important for you to start exercising to try to lose weight and to get stronger Will order prednisone taper  History of malignancy, pulmonary nodules: Follow-up CT performed by [facility]  Right-sided diaphragm elevation/weakness: Try to keep your weight down as much as possible  Patient Instructions  Will order chest x ray  Continue to wear O2 with exertion to keep sats above 90% Continue current medications  Follow up: Follow up as already schedule or sooner if needed

## 2019-04-28 NOTE — Telephone Encounter (Signed)
Notes recorded by Fenton Foy, NP on 04/28/2019 at 10:02 AM EDT  Called and spoke to patient's wife. Advised that left lower lobe mass is now 4.5 cm, was last 3.9 cm when measured at [facility] in April. Advised her to call their oncology navigator at [facility] to discuss whether or not he should have a CT scan before October: this could be done in [place] or [place]. ---------------------------------- Verlot with pt's wife, [place]. I wanted to verify that she did in fact speak with Tonya. She did and states that the pt's CT has been moved up to this coming Tuesday. Nothing further was needed.

## 2019-04-28 NOTE — Patient Instructions (Signed)
Will order chest x ray  Continue to wear O2 with exertion to keep sats above 90% Continue current medications  Follow up: Follow up as already schedule or sooner if needed

## 2019-05-04 DIAGNOSIS — G0490 Encephalitis and encephalomyelitis, unspecified: Secondary | ICD-10-CM | POA: Diagnosis not present

## 2019-05-04 DIAGNOSIS — R911 Solitary pulmonary nodule: Secondary | ICD-10-CM | POA: Diagnosis not present

## 2019-05-04 DIAGNOSIS — E2740 Unspecified adrenocortical insufficiency: Secondary | ICD-10-CM | POA: Diagnosis not present

## 2019-05-04 DIAGNOSIS — R600 Localized edema: Secondary | ICD-10-CM | POA: Diagnosis not present

## 2019-05-04 DIAGNOSIS — Z923 Personal history of irradiation: Secondary | ICD-10-CM | POA: Diagnosis not present

## 2019-05-04 DIAGNOSIS — F329 Major depressive disorder, single episode, unspecified: Secondary | ICD-10-CM | POA: Diagnosis not present

## 2019-05-04 DIAGNOSIS — Z79899 Other long term (current) drug therapy: Secondary | ICD-10-CM | POA: Diagnosis not present

## 2019-05-04 DIAGNOSIS — C7801 Secondary malignant neoplasm of right lung: Secondary | ICD-10-CM | POA: Diagnosis not present

## 2019-05-04 DIAGNOSIS — F419 Anxiety disorder, unspecified: Secondary | ICD-10-CM | POA: Diagnosis not present

## 2019-05-04 DIAGNOSIS — Z7901 Long term (current) use of anticoagulants: Secondary | ICD-10-CM | POA: Diagnosis not present

## 2019-05-04 DIAGNOSIS — R0609 Other forms of dyspnea: Secondary | ICD-10-CM | POA: Diagnosis not present

## 2019-05-04 DIAGNOSIS — C37 Malignant neoplasm of thymus: Secondary | ICD-10-CM | POA: Diagnosis not present

## 2019-05-04 DIAGNOSIS — I447 Left bundle-branch block, unspecified: Secondary | ICD-10-CM | POA: Diagnosis not present

## 2019-05-04 DIAGNOSIS — D6861 Antiphospholipid syndrome: Secondary | ICD-10-CM | POA: Diagnosis not present

## 2019-05-04 DIAGNOSIS — I429 Cardiomyopathy, unspecified: Secondary | ICD-10-CM | POA: Diagnosis not present

## 2019-05-04 DIAGNOSIS — J90 Pleural effusion, not elsewhere classified: Secondary | ICD-10-CM | POA: Diagnosis not present

## 2019-05-04 DIAGNOSIS — R0600 Dyspnea, unspecified: Secondary | ICD-10-CM | POA: Diagnosis not present

## 2019-05-05 ENCOUNTER — Telehealth: Payer: Self-pay | Admitting: Pulmonary Disease

## 2019-05-05 DIAGNOSIS — R0609 Other forms of dyspnea: Secondary | ICD-10-CM

## 2019-05-05 NOTE — Telephone Encounter (Signed)
Fine with me though I don't know if any pulmonary rehab program is open for regular services yet.

## 2019-05-05 NOTE — Telephone Encounter (Addendum)
Spoke with pt, she stated that Dr. Sharlet Salina, oncologist at [facility] stated the pt would benefit from pulmonary rehab. She would like Dr. Lake Bells to send the order for this. Please advise.   MW can you sign order for BQ, an APP cannot sign off on the order. Thanks

## 2019-05-05 NOTE — Telephone Encounter (Signed)
Called pt and advised message from the provider. Pt understood and verbalized understanding. Nothing further is needed.   Order placed for pulmonary rehab.

## 2019-05-07 ENCOUNTER — Encounter: Payer: Self-pay | Admitting: Family Medicine

## 2019-05-10 ENCOUNTER — Other Ambulatory Visit: Payer: Self-pay | Admitting: Family Medicine

## 2019-05-10 MED ORDER — FLUTICASONE PROPIONATE 50 MCG/ACT NA SUSP: NASAL | 0 refills | Status: DC

## 2019-05-11 ENCOUNTER — Telehealth (HOSPITAL_COMMUNITY): Payer: Self-pay | Admitting: *Deleted

## 2019-05-11 ENCOUNTER — Telehealth (HOSPITAL_COMMUNITY): Payer: Self-pay

## 2019-05-11 NOTE — Telephone Encounter (Signed)
2ndary insurance is active and benefits verified through [place]. Co-pay 0, DED 0/0 met, out of pocket 0/0 met, co-insurance 0. No pre-authorization required

## 2019-05-11 NOTE — Telephone Encounter (Signed)
Received pulmonary rehab referral for this pt to participate in Pulmonary Rehab with the diagnosis of Dyspnea on exertion.  Referral placed by Dr. Melvyn Novas on behalf of Dr. Lake Bells.  Called and spoke to pt regarding Pulmonary Rehab.  Noted that pt resides in [facility]  Pt would prefer to participate at [facility] because it is closer to his home. Will check with [facility] to see whether they are scheduling for Pulmonary Rehab.  Message left with [place].  Await return call. Cherre Huger, BSN Cardiac and Training and development officer

## 2019-05-11 NOTE — Telephone Encounter (Signed)
Pt insurance is active and benefits verified through Medicare a/b Co-pay 0, DED $198/$198 met, out of pocket 0/0 met, co-insurance 20%. no pre-authorization required. Passport, 05/11/2019 @ 11:53am, REF# [id number]

## 2019-05-12 ENCOUNTER — Telehealth (HOSPITAL_COMMUNITY): Payer: Self-pay

## 2019-05-17 ENCOUNTER — Telehealth (HOSPITAL_COMMUNITY): Payer: Self-pay | Admitting: *Deleted

## 2019-05-17 DIAGNOSIS — F331 Major depressive disorder, recurrent, moderate: Secondary | ICD-10-CM | POA: Diagnosis not present

## 2019-05-17 NOTE — Telephone Encounter (Signed)
Called to reschedule pulmonary rehab orientation/walk test d/t to a new opening that would allow patient to start earlier.  Discussed with wife, Hal Hope and their grandchild is to be born via c-section June 09, 2019 and the patient will be required to quarentine x 14 days prior to the birth so as to be able to see the baby in the hospital.  He has been rescheduled for 06-14-2019 @ 0900 and will begin exercising 06-22-2019 in the 1030 class.

## 2019-06-01 ENCOUNTER — Inpatient Hospital Stay: Payer: Medicare Other | Attending: Family

## 2019-06-01 ENCOUNTER — Other Ambulatory Visit: Payer: Self-pay

## 2019-06-01 VITALS — BP 118/78 | HR 93 | Temp 97.1°F

## 2019-06-01 DIAGNOSIS — Z452 Encounter for adjustment and management of vascular access device: Secondary | ICD-10-CM | POA: Diagnosis not present

## 2019-06-01 DIAGNOSIS — I517 Cardiomegaly: Secondary | ICD-10-CM | POA: Diagnosis not present

## 2019-06-01 DIAGNOSIS — F410 Panic disorder [episodic paroxysmal anxiety] without agoraphobia: Secondary | ICD-10-CM | POA: Diagnosis not present

## 2019-06-01 DIAGNOSIS — I442 Atrioventricular block, complete: Secondary | ICD-10-CM | POA: Diagnosis not present

## 2019-06-01 DIAGNOSIS — Z95828 Presence of other vascular implants and grafts: Secondary | ICD-10-CM

## 2019-06-01 DIAGNOSIS — Z45018 Encounter for adjustment and management of other part of cardiac pacemaker: Secondary | ICD-10-CM | POA: Diagnosis not present

## 2019-06-01 DIAGNOSIS — C37 Malignant neoplasm of thymus: Secondary | ICD-10-CM | POA: Insufficient documentation

## 2019-06-01 DIAGNOSIS — R0602 Shortness of breath: Secondary | ICD-10-CM | POA: Diagnosis not present

## 2019-06-01 DIAGNOSIS — E2749 Other adrenocortical insufficiency: Secondary | ICD-10-CM | POA: Diagnosis not present

## 2019-06-01 DIAGNOSIS — G4733 Obstructive sleep apnea (adult) (pediatric): Secondary | ICD-10-CM | POA: Diagnosis not present

## 2019-06-01 DIAGNOSIS — Z950 Presence of cardiac pacemaker: Secondary | ICD-10-CM | POA: Diagnosis not present

## 2019-06-01 DIAGNOSIS — R410 Disorientation, unspecified: Secondary | ICD-10-CM | POA: Diagnosis not present

## 2019-06-01 DIAGNOSIS — D6861 Antiphospholipid syndrome: Secondary | ICD-10-CM | POA: Diagnosis not present

## 2019-06-01 DIAGNOSIS — H9311 Tinnitus, right ear: Secondary | ICD-10-CM | POA: Diagnosis not present

## 2019-06-01 DIAGNOSIS — E2740 Unspecified adrenocortical insufficiency: Secondary | ICD-10-CM | POA: Diagnosis not present

## 2019-06-01 MED ORDER — HEPARIN SOD (PORK) LOCK FLUSH 100 UNIT/ML IV SOLN
500.0000 [IU] | Freq: Once | INTRAVENOUS | Status: AC
  Administered 2019-06-01: 09:00:00 500 [IU] via INTRAVENOUS
  Filled 2019-06-01: qty 5

## 2019-06-01 MED ORDER — SODIUM CHLORIDE 0.9% FLUSH
10.0000 mL | INTRAVENOUS | Status: DC | PRN
  Administered 2019-06-01: 10 mL via INTRAVENOUS
  Filled 2019-06-01: qty 10

## 2019-06-01 NOTE — Patient Instructions (Signed)

## 2019-06-02 DIAGNOSIS — Z1159 Encounter for screening for other viral diseases: Secondary | ICD-10-CM | POA: Diagnosis not present

## 2019-06-03 DIAGNOSIS — I447 Left bundle-branch block, unspecified: Secondary | ICD-10-CM | POA: Diagnosis not present

## 2019-06-04 ENCOUNTER — Telehealth (HOSPITAL_COMMUNITY): Payer: Self-pay

## 2019-06-07 ENCOUNTER — Ambulatory Visit (HOSPITAL_COMMUNITY): Payer: Medicare Other

## 2019-06-07 ENCOUNTER — Encounter (HOSPITAL_COMMUNITY): Payer: Medicare Other

## 2019-06-07 DIAGNOSIS — Z23 Encounter for immunization: Secondary | ICD-10-CM | POA: Diagnosis not present

## 2019-06-14 ENCOUNTER — Encounter (HOSPITAL_COMMUNITY)
Admission: RE | Admit: 2019-06-14 | Discharge: 2019-06-14 | Disposition: A | Discharge status: Home / Self Care | Payer: Medicare Other | Source: Ambulatory Visit | Attending: Pulmonary Disease | Admitting: Pulmonary Disease

## 2019-06-14 ENCOUNTER — Other Ambulatory Visit: Payer: Self-pay

## 2019-06-14 VITALS — BP 132/86 | HR 85 | Temp 97.9°F | Ht 72.0 in | Wt 241.4 lb

## 2019-06-14 DIAGNOSIS — R0609 Other forms of dyspnea: Secondary | ICD-10-CM | POA: Diagnosis not present

## 2019-06-14 NOTE — Progress Notes (Signed)
Pulmonary Individual Treatment Plan  Patient Details  Name: Robert Kirby MRN: [id number] Date of Birth: 1950-10-19 Referring Provider:     Pulmonary Rehab Walk Test from 06/14/2019 in [place]  Referring Provider  Dr. Melvyn Novas      Initial Encounter Date:    Pulmonary Rehab Walk Test from 06/14/2019 in [place]  Date  06/14/19      Visit Diagnosis: Dyspnea on exertion  Patient's Home Medications on Admission:   Current Outpatient Medications:  .  ARIPiprazole (ABILIFY) 5 MG tablet, Take 5 mg by mouth daily., Disp: , Rfl:  .  busPIRone (BUSPAR) 15 MG tablet, Take 15 mg by mouth. 2 tabs in am, 2 tabs in pm, Disp: , Rfl:  .  carbamazepine (TEGRETOL) 200 MG tablet, Take 2 tablets in the am and 2 tablets in the pm, Disp: , Rfl:  .  CARVEDILOL PO, Take 1 tablet by mouth 2 (two) times daily., Disp: , Rfl:  .  clonazePAM (KLONOPIN) 0.5 MG tablet, Take 0.5 mg by mouth 2 (two) times daily., Disp: , Rfl:  .  escitalopram (LEXAPRO) 20 MG tablet, Take 20 mg by mouth daily., Disp: , Rfl:  .  fluticasone (FLONASE) 50 MCG/ACT nasal spray, SHAKE LIQUID AND USE 2 SPRAYS IN EACH NOSTRIL DAILY, Disp: 48 g, Rfl: 0 .  furosemide (LASIX) 20 MG tablet, Take 1 tablet by mouth daily., Disp: , Rfl:  .  hydrocortisone (CORTEF) 10 MG tablet, TAKE 2 TABLETS IN THE MORNING AND 1 TABLET IN THE EVENING FOR ADDISONS DISEASE, Disp: 270 tablet, Rfl: 3 .  L-Methylfolate-Algae (DEPLIN 15) 15-90.314 MG CAPS, Take 1 capsule by mouth daily., Disp: , Rfl:  .  lansoprazole (PREVACID) 15 MG capsule, TAKE 1 CAPSULE DAILY, Disp: 90 capsule, Rfl: 3 .  losartan (COZAAR) 25 MG tablet, Take 25 mg by mouth daily., Disp: , Rfl:  .  mirtazapine (REMERON) 45 MG tablet, Take 90 mg by mouth at bedtime. , Disp: , Rfl:  .  riTUXimab (RITUXAN) 100 MG/10ML injection, Inject into the vein every 6 (six) months., Disp: , Rfl:  .  rosuvastatin (CRESTOR) 5 MG tablet, Take 5 mg by  mouth daily., Disp: , Rfl:  .  valproic acid (DEPAKENE) 250 MG capsule, Take 3 capsules in the am and 3 capsules in the pm, Disp: , Rfl:  .  warfarin (COUMADIN) 5 MG tablet, 5 mg at bedtime. As directed , Disp: , Rfl:  .  levocetirizine (XYZAL) 5 MG tablet, TAKE 1 TABLET(5 MG) BY MOUTH EVERY EVENING, Disp: 30 tablet, Rfl: 3 .  memantine (NAMENDA) 10 MG tablet, Take 10 mg by mouth 2 (two) times daily. , Disp: , Rfl:  .  tiZANidine (ZANAFLEX) 4 MG tablet, Take 1 tablet (4 mg total) by mouth every 6 (six) hours as needed for muscle spasms., Disp: 21 tablet, Rfl: 0 .  traMADol (ULTRAM) 50 MG tablet, Take 1 tablet (50 mg total) by mouth every 8 (eight) hours as needed (Pain)., Disp: 21 tablet, Rfl: 0  Past Medical History: Past Medical History:  Diagnosis Date  . Addison's disease ([place])   . Antiphospholipid antibody syndrome ([place])   . Arrhythmia    Pacemaker  . Autoimmune encephalomyelitis   . Depression   . GERD (gastroesophageal reflux disease)   . History of blood transfusion    13 units from a GI Bleed.   [place] Kitchen History of GI bleed   . OSA on CPAP   .  Sleep apnea    on CPAP  . 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      Capillary Blood Glucose: No results found for: GLUCAP   Pulmonary Assessment Scores: Pulmonary Assessment Scores    Row Name 06/14/19 1053 06/14/19 1054       ADL UCSD   ADL Phase  Entry  -    SOB Score total  -  58      CAT Score   CAT Score  -  pre 16      mMRC Score   mMRC Score  2  -      UCSD: Self-administered rating of dyspnea associated with activities of daily living (ADLs) 6-point scale (0 = "not at all" to 5 = "maximal or unable to do because of breathlessness")  Scoring Scores range from 0 to 120.  Minimally important difference is 5 units  CAT: CAT can  identify the health impairment of COPD patients and is better correlated with disease progression.  CAT has a scoring range of zero to 40. The CAT score is classified into four groups of low (less than 10), medium (10 - 20), high (21-30) and very high (31-40) based on the impact level of disease on health status. A CAT score over 10 suggests significant symptoms.  A worsening CAT score could be explained by an exacerbation, poor medication adherence, poor inhaler technique, or progression of COPD or comorbid conditions.  CAT MCID is 2 points  mMRC: mMRC (Modified Medical Research Council) Dyspnea Scale is used to assess the degree of baseline functional disability in patients of respiratory disease due to dyspnea. No minimal important difference is established. A decrease in score of 1 point or greater is considered a positive change.   Pulmonary Function Assessment:   Exercise Target Goals: Exercise Program Goal: Individual exercise prescription set using results from initial 6 min walk test and THRR while considering  patient's activity barriers and safety.   Exercise Prescription Goal: Initial exercise prescription builds to 30-45 minutes a day of aerobic activity, 2-3 days per week.  Home exercise guidelines will be given to patient during program as part of exercise prescription that the participant will acknowledge.  Activity Barriers & Risk Stratification:   6 Minute Walk: 6 Minute Walk    Row Name 06/14/19 1053         6 Minute Walk   Phase  Initial     Distance  1020 feet     Walk Time  6 minutes     # of Rest Breaks  0     MPH  1.93     METS  2.45     RPE  12     Perceived Dyspnea   1     VO2 Peak  7.8     Symptoms  No     Resting HR  79 bpm     Resting BP  124/84     Resting Oxygen Saturation   96 %     Exercise Oxygen Saturation  during 6 min walk  90 %     Max Ex. HR  95 bpm     Max Ex. BP  134/88     2 Minute Post BP  112/80  Interval HR   1 Minute HR   92     2 Minute HR  95     3 Minute HR  94     4 Minute HR  95     5 Minute HR  94     6 Minute HR  95     2 Minute Post HR  84     Interval Heart Rate?  Yes       Interval Oxygen   Interval Oxygen?  Yes     Baseline Oxygen Saturation %  96 %     1 Minute Oxygen Saturation %  94 %     1 Minute Liters of Oxygen  0 L     2 Minute Oxygen Saturation %  94 %     2 Minute Liters of Oxygen  0 L     3 Minute Oxygen Saturation %  91 %     3 Minute Liters of Oxygen  0 L     4 Minute Oxygen Saturation %  90 %     4 Minute Liters of Oxygen  0 L     5 Minute Oxygen Saturation %  90 %     5 Minute Liters of Oxygen  0 L     6 Minute Oxygen Saturation %  91 %     6 Minute Liters of Oxygen  0 L     2 Minute Post Oxygen Saturation %  96 %     2 Minute Post Liters of Oxygen  0 L        Oxygen Initial Assessment: Oxygen Initial Assessment - 06/14/19 1053      Home Oxygen   Home Oxygen Device  Home Concentrator    Sleep Oxygen Prescription  CPAP    Home Exercise Oxygen Prescription  None    Home at Rest Exercise Oxygen Prescription  None    Compliance with Home Oxygen Use  Yes      Initial 6 min Walk   Oxygen Used  None      Program Oxygen Prescription   Program Oxygen Prescription  None      Intervention   Short Term Goals  To learn and demonstrate proper use of respiratory medications;To learn and demonstrate proper pursed lip breathing techniques or other breathing techniques.;To learn and understand importance of maintaining oxygen saturations>88%;To learn and understand importance of monitoring SPO2 with pulse oximeter and demonstrate accurate use of the pulse oximeter.;To learn and exhibit compliance with exercise, home and travel O2 prescription    Long  Term Goals  Compliance with respiratory medication;Exhibits proper breathing techniques, such as pursed lip breathing or other method taught during program session;Maintenance of O2 saturations>88%;Verbalizes importance of  monitoring SPO2 with pulse oximeter and return demonstration;Exhibits compliance with exercise, home and travel O2 prescription       Oxygen Re-Evaluation:   Oxygen Discharge (Final Oxygen Re-Evaluation):   Initial Exercise Prescription: Initial Exercise Prescription - 06/14/19 1000      Date of Initial Exercise RX and Referring Provider   Date  06/14/19    Referring Provider  Dr. Melvyn Novas      Treadmill   MPH  1.8    Grade  1    Minutes  15      T5 Nustep   Level  2    SPM  80    Minutes  15      Prescription Details   Frequency (times per week)  2    Duration  Progress to 30 minutes of continuous aerobic without signs/symptoms of physical distress      Intensity   THRR 40-80% of Max Heartrate  61-122    Ratings of Perceived Exertion  11-13    Perceived Dyspnea  0-4      Progression   Progression  Continue progressive overload as per policy without signs/symptoms or physical distress.      Resistance Training   Training Prescription  Yes    Weight  blue bands    Reps  10-15       Perform Capillary Blood Glucose checks as needed.  Exercise Prescription Changes:   Exercise Comments:   Exercise Goals and Review: Exercise Goals    Row Name 06/14/19 1100             Exercise Goals   Increase Physical Activity  Yes       Intervention  Provide advice, education, support and counseling about physical activity/exercise needs.;Develop an individualized exercise prescription for aerobic and resistive training based on initial evaluation findings, risk stratification, comorbidities and participant's personal goals.       Expected Outcomes  Long Term: Exercising regularly at least 3-5 days a week.;Long Term: Add in home exercise to make exercise part of routine and to increase amount of physical activity.;Short Term: Attend rehab on a regular basis to increase amount of physical activity.       Increase Strength and Stamina  Yes       Intervention  Provide advice,  education, support and counseling about physical activity/exercise needs.;Develop an individualized exercise prescription for aerobic and resistive training based on initial evaluation findings, risk stratification, comorbidities and participant's personal goals.       Expected Outcomes  Short Term: Increase workloads from initial exercise prescription for resistance, speed, and METs.;Short Term: Perform resistance training exercises routinely during rehab and add in resistance training at home;Long Term: Improve cardiorespiratory fitness, muscular endurance and strength as measured by increased METs and functional capacity (6MWT)       Able to understand and use rate of perceived exertion (RPE) scale  Yes       Intervention  Provide education and explanation on how to use RPE scale       Expected Outcomes  Short Term: Able to use RPE daily in rehab to express subjective intensity level;Long Term:  Able to use RPE to guide intensity level when exercising independently       Able to understand and use Dyspnea scale  Yes       Intervention  Provide education and explanation on how to use Dyspnea scale       Expected Outcomes  Short Term: Able to use Dyspnea scale daily in rehab to express subjective sense of shortness of breath during exertion;Long Term: Able to use Dyspnea scale to guide intensity level when exercising independently       Knowledge and understanding of Target Heart Rate Range (THRR)  Yes       Expected Outcomes  Short Term: Able to state/look up THRR;Short Term: Able to use daily as guideline for intensity in rehab;Long Term: Able to use THRR to govern intensity when exercising independently       Understanding of Exercise Prescription  Yes       Intervention  Provide education, explanation, and written materials on patient's individual exercise prescription       Expected Outcomes  Short Term: Able to explain program exercise prescription;Long Term:  Able to explain home exercise  prescription to exercise independently          Exercise Goals Re-Evaluation :   Discharge Exercise Prescription (Final Exercise Prescription Changes):   Nutrition:  Target Goals: Understanding of nutrition guidelines, daily intake of sodium 1500mg , cholesterol 200mg , calories 30% from fat and 7% or less from saturated fats, daily to have 5 or more servings of fruits and vegetables.  Biometrics: Pre Biometrics - 06/14/19 1052      Pre Biometrics   Grip Strength  42 kg        Nutrition Therapy Plan and Nutrition Goals:   Nutrition Assessments:   Nutrition Goals Re-Evaluation:   Nutrition Goals Discharge (Final Nutrition Goals Re-Evaluation):   Psychosocial: Target Goals: Acknowledge presence or absence of significant depression and/or stress, maximize coping skills, provide positive support system. Participant is able to verbalize types and ability to use techniques and skills needed for reducing stress and depression.  Initial Review & Psychosocial Screening:   Quality of Life Scores:  Scores of 19 and below usually indicate a poorer quality of life in these areas.  A difference of  2-3 points is a clinically meaningful difference.  A difference of 2-3 points in the total score of the Quality of Life Index has been associated with significant improvement in overall quality of life, self-image, physical symptoms, and general health in studies assessing change in quality of life.  PHQ-9: Recent Review Flowsheet Data    Depression screen [facility] 2/9 06/14/2019 11/27/2017   Decreased Interest 0 2   Down, Depressed, Hopeless 1 2   PHQ - 2 Score 1 4   Altered sleeping 0 0   Tired, decreased energy 1 0   Change in appetite 0 0   Feeling bad or failure about yourself  0 0   Trouble concentrating 0 0   Moving slowly or fidgety/restless 0 0   Suicidal thoughts 0 0   PHQ-9 Score - 4   Difficult doing work/chores Not difficult at all Somewhat difficult     Interpretation  of Total Score  Total Score Depression Severity:  1-4 = Minimal depression, 5-9 = Mild depression, 10-14 = Moderate depression, 15-19 = Moderately severe depression, 20-27 = Severe depression   Psychosocial Evaluation and Intervention:   Psychosocial Re-Evaluation:   Psychosocial Discharge (Final Psychosocial Re-Evaluation):   Education: Education Goals: Education classes will be provided on a weekly basis, covering required topics. Participant will state understanding/return demonstration of topics presented.  Learning Barriers/Preferences:   Education Topics: Risk Factor Reduction:  -Group instruction that is supported by a PowerPoint presentation. Instructor discusses the definition of a risk factor, different risk factors for pulmonary disease, and how the heart and lungs work together.     Nutrition for Pulmonary Patient:  -Group instruction provided by PowerPoint slides, verbal discussion, and written materials to support subject matter. The instructor gives an explanation and review of healthy diet recommendations, which includes a discussion on weight management, recommendations for fruit and vegetable consumption, as well as protein, fluid, caffeine, fiber, sodium, sugar, and alcohol. Tips for eating when patients are short of breath are discussed.   Pursed Lip Breathing:  -Group instruction that is supported by demonstration and informational handouts. Instructor discusses the benefits of pursed lip and diaphragmatic breathing and detailed demonstration on how to preform both.     Oxygen Safety:  -Group instruction provided by PowerPoint, verbal discussion, and written material to support subject matter. There is an overview of "What is  Oxygen" and "Why do we need it".  Instructor also reviews how to create a safe environment for oxygen use, the importance of using oxygen as prescribed, and the risks of noncompliance. There is a brief discussion on traveling with oxygen and  resources the patient may utilize.   Oxygen Equipment:  -Group instruction provided by [facility] utilizing handouts, written materials, and equipment demonstrations.   Signs and Symptoms:  -Group instruction provided by written material and verbal discussion to support subject matter. Warning signs and symptoms of infection, stroke, and heart attack are reviewed and when to call the physician/911 reinforced. Tips for preventing the spread of infection discussed.   Advanced Directives:  -Group instruction provided by verbal instruction and written material to support subject matter. Instructor reviews Advanced Directive laws and proper instruction for filling out document.   Pulmonary Video:  -Group video education that reviews the importance of medication and oxygen compliance, exercise, good nutrition, pulmonary hygiene, and pursed lip and diaphragmatic breathing for the pulmonary patient.   Exercise for the Pulmonary Patient:  -Group instruction that is supported by a PowerPoint presentation. Instructor discusses benefits of exercise, core components of exercise, frequency, duration, and intensity of an exercise routine, importance of utilizing pulse oximetry during exercise, safety while exercising, and options of places to exercise outside of rehab.     Pulmonary Medications:  -Verbally interactive group education provided by instructor with focus on inhaled medications and proper administration.   Anatomy and Physiology of the Respiratory System and Intimacy:  -Group instruction provided by PowerPoint, verbal discussion, and written material to support subject matter. Instructor reviews respiratory cycle and anatomical components of the respiratory system and their functions. Instructor also reviews differences in obstructive and restrictive respiratory diseases with examples of each. Intimacy, Sex, and Sexuality differences are reviewed with a discussion on how relationships  can change when diagnosed with pulmonary disease. Common sexual concerns are reviewed.   MD DAY -A group question and answer session with a medical doctor that allows participants to ask questions that relate to their pulmonary disease state.   OTHER EDUCATION -Group or individual verbal, written, or video instructions that support the educational goals of the pulmonary rehab program.   Holiday Eating Survival Tips:  -Group instruction provided by PowerPoint slides, verbal discussion, and written materials to support subject matter. The instructor gives patients tips, tricks, and techniques to help them not only survive but enjoy the holidays despite the onslaught of food that accompanies the holidays.   Knowledge Questionnaire Score:   Core Components/Risk Factors/Patient Goals at Admission:   Core Components/Risk Factors/Patient Goals Review:    Core Components/Risk Factors/Patient Goals at Discharge (Final Review):    ITP Comments:   Comments:

## 2019-06-14 NOTE — Progress Notes (Signed)
Robert Kirby 68 y.o. male Pulmonary Rehab Orientation Note Patient arrived today in Cardiac and Pulmonary Rehab for orientation to Pulmonary Rehab. He walked from the [place] garage without oxygen and tolerated it well. He does not carry portable oxygen. Per pt, he uses oxygen infrequently, when he is having difficulty breathing he will use supplemental oxygen @ 1-2 L/ min. Color good, skin warm and dry. Patient is oriented to time and place. Patient's medical history, psychosocial health, and medications reviewed. Psychosocial assessment reveals pt lives with their spouse. Pt is currently retired as a Theme park manager. Pt hobbies include woodworking. Pt reports his stress level is low.  Pt does not exhibit  signs of depression even though he is being treated for current and a history of depression and is being treated by a psychiatrist @ [facility]. PHQ2/9 score 1/1. Pt shows good  coping skills with not done outlook . Will continue to monitor and evaluate progress toward psychosocial goal(s) of continued stability of depression while in the program. Physical assessment reveals heart rate is normal, breath sounds clear to auscultation, no wheezes, rales, or rhonchi. Grip strength equal, strong. Patient's wife reports he does take medications as prescribed. His wife was available via speaker phone to go over medications since patient's memory is unreliable at times. Patient states he follows a Regular diet. The patient has been trying to gain weight by cutting back on his caloric intake.. Patient's weight will be monitored closely. Demonstration and practice of PLB using pulse oximeter. Patient able to return demonstration satisfactorily. Safety and hand hygiene in the exercise area reviewed with patient. Patient voices understanding of the information reviewed. Department expectations discussed with patient and achievable goals were set. The patient shows enthusiasm about attending the program and we  look forward to working with this nice gentleman. The patient completed a 6 min walk test today and to begin exercise on Tuesday, June 22, 2019 in the 10:30 am class. He is scheduled for a stress test Thursday, September 24 and depending on the results this may delay him starting in the program, will await results and approval to start pulmonary rehab. [phone]

## 2019-06-16 DIAGNOSIS — Z9989 Dependence on other enabling machines and devices: Secondary | ICD-10-CM | POA: Diagnosis not present

## 2019-06-16 DIAGNOSIS — I429 Cardiomyopathy, unspecified: Secondary | ICD-10-CM | POA: Diagnosis not present

## 2019-06-16 DIAGNOSIS — C37 Malignant neoplasm of thymus: Secondary | ICD-10-CM | POA: Diagnosis not present

## 2019-06-16 DIAGNOSIS — D6861 Antiphospholipid syndrome: Secondary | ICD-10-CM | POA: Diagnosis not present

## 2019-06-16 DIAGNOSIS — I447 Left bundle-branch block, unspecified: Secondary | ICD-10-CM | POA: Diagnosis not present

## 2019-06-16 DIAGNOSIS — F329 Major depressive disorder, single episode, unspecified: Secondary | ICD-10-CM | POA: Diagnosis not present

## 2019-06-16 DIAGNOSIS — G0481 Other encephalitis and encephalomyelitis: Secondary | ICD-10-CM | POA: Diagnosis not present

## 2019-06-16 DIAGNOSIS — F331 Major depressive disorder, recurrent, moderate: Secondary | ICD-10-CM | POA: Diagnosis not present

## 2019-06-16 DIAGNOSIS — F418 Other specified anxiety disorders: Secondary | ICD-10-CM | POA: Diagnosis not present

## 2019-06-16 DIAGNOSIS — Z7901 Long term (current) use of anticoagulants: Secondary | ICD-10-CM | POA: Diagnosis not present

## 2019-06-16 DIAGNOSIS — R413 Other amnesia: Secondary | ICD-10-CM | POA: Diagnosis not present

## 2019-06-17 DIAGNOSIS — R0602 Shortness of breath: Secondary | ICD-10-CM | POA: Diagnosis not present

## 2019-06-17 DIAGNOSIS — R0609 Other forms of dyspnea: Secondary | ICD-10-CM | POA: Diagnosis not present

## 2019-06-21 ENCOUNTER — Telehealth (HOSPITAL_COMMUNITY): Payer: Self-pay | Admitting: Family Medicine

## 2019-06-22 ENCOUNTER — Other Ambulatory Visit: Payer: Self-pay

## 2019-06-22 ENCOUNTER — Encounter (HOSPITAL_COMMUNITY)
Admission: RE | Admit: 2019-06-22 | Discharge: 2019-06-22 | Disposition: A | Discharge status: Home / Self Care | Payer: Medicare Other | Source: Ambulatory Visit | Attending: Pulmonary Disease | Admitting: Pulmonary Disease

## 2019-06-22 ENCOUNTER — Telehealth (HOSPITAL_COMMUNITY): Payer: Self-pay

## 2019-06-22 DIAGNOSIS — R0609 Other forms of dyspnea: Secondary | ICD-10-CM | POA: Diagnosis not present

## 2019-06-22 NOTE — Progress Notes (Signed)
Daily Session Note  Patient Details  Name: VADA YELLEN MRN: [id number] Date of Birth: 08-15-1951 Referring Provider:     Pulmonary Rehab Walk Test from 06/14/2019 in [place]  Referring Provider  Dr. Melvyn Novas      Encounter Date: 06/22/2019  Check In: Session Check In - 06/22/19 1030      Check-In   Supervising physician immediately available to respond to emergencies  [facility] immediately available    Physician(s)  Dr. Nevada Crane    Location  [facility]    Staff Present  Rosebud Poles, RN, Bjorn Loser, MS, Exercise Physiologist;Lisa Ysidro Evert, RN    Warm-up and Cool-down  Performed as group-led instruction    Resistance Training Performed  Yes    VAD Patient?  No    PAD/SET Patient?  No      Pain Assessment   Currently in Pain?  No/denies    Multiple Pain Sites  No       Capillary Blood Glucose: No results found for this or any previous visit (from the past 24 hour(s)).    Social History   Tobacco Use  Smoking Status Never Smoker  Smokeless Tobacco Never Used    Goals Met:  Proper associated with RPD/PD & O2 Sat Exercise tolerated well Strength training completed today  Goals Unmet:  Not Applicable  Comments: Service time is from 1015 to 1122    Dr. Rush Farmer is Medical Director for Pulmonary Rehab at [facility].

## 2019-06-24 ENCOUNTER — Other Ambulatory Visit: Payer: Self-pay

## 2019-06-24 ENCOUNTER — Encounter (HOSPITAL_COMMUNITY)
Admission: RE | Admit: 2019-06-24 | Discharge: 2019-06-24 | Disposition: A | Discharge status: Home / Self Care | Payer: Medicare Other | Source: Ambulatory Visit | Attending: Pulmonary Disease | Admitting: Pulmonary Disease

## 2019-06-24 DIAGNOSIS — R0609 Other forms of dyspnea: Secondary | ICD-10-CM | POA: Diagnosis not present

## 2019-06-24 NOTE — Progress Notes (Signed)
Daily Session Note  Patient Details  Name: CREWE HEATHMAN MRN: [id number] Date of Birth: 08-May-1951 Referring Provider:     Pulmonary Rehab Walk Test from 06/14/2019 in [place]  Referring Provider  Dr. Melvyn Novas      Encounter Date: 06/24/2019  Check In: Session Check In - 06/24/19 1037      Check-In   Supervising physician immediately available to respond to emergencies  [facility] immediately available    Physician(s)  Dr. Benny Lennert    Location  [facility]    Staff Present  Rosebud Poles, RN, Bjorn Loser, MS, Exercise Physiologist;Lisa Ysidro Evert, RN    Virtual Visit  No    Medication changes reported      No    Fall or balance concerns reported     No    Tobacco Cessation  No Change    Warm-up and Cool-down  Performed as group-led instruction    Resistance Training Performed  Yes    VAD Patient?  No    PAD/SET Patient?  No      Pain Assessment   Currently in Pain?  No/denies    Multiple Pain Sites  No       Capillary Blood Glucose: No results found for this or any previous visit (from the past 24 hour(s)).    Social History   Tobacco Use  Smoking Status Never Smoker  Smokeless Tobacco Never Used    Goals Met:  Proper associated with RPD/PD & O2 Sat Exercise tolerated well Strength training completed today  Goals Unmet:  Not Applicable  Comments: Service time is from 1017 to 1122    Dr. Rush Farmer is Medical Director for Pulmonary Rehab at [facility].

## 2019-06-29 ENCOUNTER — Encounter (HOSPITAL_COMMUNITY)
Admission: RE | Admit: 2019-06-29 | Discharge: 2019-06-29 | Disposition: A | Discharge status: Home / Self Care | Payer: Medicare Other | Source: Ambulatory Visit | Attending: Pulmonary Disease | Admitting: Pulmonary Disease

## 2019-06-29 ENCOUNTER — Other Ambulatory Visit: Payer: Self-pay

## 2019-06-29 DIAGNOSIS — R0609 Other forms of dyspnea: Secondary | ICD-10-CM

## 2019-06-29 NOTE — Progress Notes (Signed)
Daily Session Note  Patient Details  Name: Robert Kirby MRN: [id number] Date of Birth: 11-20-1950 Referring Provider:     Pulmonary Rehab Walk Test from 06/14/2019 in [place]  Referring Provider  Dr. Melvyn Novas      Encounter Date: 06/29/2019  Check In: Session Check In - 06/29/19 1131      Check-In   Supervising physician immediately available to respond to emergencies  [facility] immediately available    Physician(s)  Dr. Benny Lennert    Location  [facility]    Staff Present  Rosebud Poles, RN, BSN;Carlette Wilber Oliphant, RN, Bjorn Loser, MS, Exercise Physiologist    Virtual Visit  No    Medication changes reported      No    Fall or balance concerns reported     No    Tobacco Cessation  No Change    Warm-up and Cool-down  Performed as group-led instruction    Resistance Training Performed  Yes    VAD Patient?  No    PAD/SET Patient?  No      Pain Assessment   Currently in Pain?  No/denies    Multiple Pain Sites  No       Capillary Blood Glucose: No results found for this or any previous visit (from the past 24 hour(s)).  Exercise Prescription Changes - 06/29/19 1100      Response to Exercise   Blood Pressure (Admit)  114/70    Blood Pressure (Exercise)  124/60    Blood Pressure (Exit)  116/80    Heart Rate (Admit)  93 bpm    Heart Rate (Exercise)  101 bpm    Heart Rate (Exit)  96 bpm    Oxygen Saturation (Admit)  99 %    Oxygen Saturation (Exercise)  90 %    Oxygen Saturation (Exit)  97 %    Rating of Perceived Exertion (Exercise)  12    Perceived Dyspnea (Exercise)  1    Duration  Continue with 30 min of aerobic exercise without signs/symptoms of physical distress.    Intensity  --   40-80% HRR     Progression   Progression  Continue to progress workloads to maintain intensity without signs/symptoms of physical distress.      Resistance Training   Training Prescription  Yes    Weight  blue bands    Reps   10-15    Time  10 Minutes      Interval Training   Interval Training  No      Treadmill   MPH  2    Grade  1    Minutes  15      T5 Nustep   Level  2    SPM  80    Minutes  15    METs  1.9       Social History   Tobacco Use  Smoking Status Never Smoker  Smokeless Tobacco Never Used    Goals Met:  Proper associated with RPD/PD & O2 Sat Exercise tolerated well Strength training completed today  Goals Unmet:  Not Applicable  Comments: Service time is from 1015 to 1120    Dr. Rush Farmer is Medical Director for Pulmonary Rehab at [facility].

## 2019-07-01 ENCOUNTER — Encounter (HOSPITAL_COMMUNITY): Payer: Medicare Other

## 2019-07-01 DIAGNOSIS — D8989 Other specified disorders involving the immune mechanism, not elsewhere classified: Secondary | ICD-10-CM | POA: Diagnosis not present

## 2019-07-01 DIAGNOSIS — G0481 Other encephalitis and encephalomyelitis: Secondary | ICD-10-CM | POA: Diagnosis not present

## 2019-07-01 DIAGNOSIS — R569 Unspecified convulsions: Secondary | ICD-10-CM | POA: Diagnosis not present

## 2019-07-06 ENCOUNTER — Encounter (HOSPITAL_COMMUNITY): Payer: Medicare Other

## 2019-07-06 DIAGNOSIS — R918 Other nonspecific abnormal finding of lung field: Secondary | ICD-10-CM | POA: Diagnosis not present

## 2019-07-06 DIAGNOSIS — R569 Unspecified convulsions: Secondary | ICD-10-CM | POA: Diagnosis not present

## 2019-07-06 DIAGNOSIS — Z7901 Long term (current) use of anticoagulants: Secondary | ICD-10-CM | POA: Diagnosis not present

## 2019-07-06 DIAGNOSIS — J90 Pleural effusion, not elsewhere classified: Secondary | ICD-10-CM | POA: Diagnosis not present

## 2019-07-06 DIAGNOSIS — D6861 Antiphospholipid syndrome: Secondary | ICD-10-CM | POA: Diagnosis not present

## 2019-07-06 DIAGNOSIS — C37 Malignant neoplasm of thymus: Secondary | ICD-10-CM | POA: Diagnosis not present

## 2019-07-06 DIAGNOSIS — R0609 Other forms of dyspnea: Secondary | ICD-10-CM | POA: Diagnosis not present

## 2019-07-06 DIAGNOSIS — E2740 Unspecified adrenocortical insufficiency: Secondary | ICD-10-CM | POA: Diagnosis not present

## 2019-07-06 DIAGNOSIS — F419 Anxiety disorder, unspecified: Secondary | ICD-10-CM | POA: Diagnosis not present

## 2019-07-06 DIAGNOSIS — I429 Cardiomyopathy, unspecified: Secondary | ICD-10-CM | POA: Diagnosis not present

## 2019-07-06 DIAGNOSIS — I447 Left bundle-branch block, unspecified: Secondary | ICD-10-CM | POA: Diagnosis not present

## 2019-07-08 ENCOUNTER — Encounter (HOSPITAL_COMMUNITY)
Admission: RE | Admit: 2019-07-08 | Discharge: 2019-07-08 | Disposition: A | Discharge status: Home / Self Care | Payer: Medicare Other | Source: Ambulatory Visit | Attending: Pulmonary Disease | Admitting: Pulmonary Disease

## 2019-07-08 ENCOUNTER — Other Ambulatory Visit: Payer: Self-pay

## 2019-07-08 DIAGNOSIS — R0609 Other forms of dyspnea: Secondary | ICD-10-CM | POA: Diagnosis not present

## 2019-07-08 NOTE — Progress Notes (Signed)
Daily Session Note  Patient Details  Name: Robert Kirby MRN: [id number] Date of Birth: May 07, 1951 Referring Provider:     Pulmonary Rehab Walk Test from 06/14/2019 in [place]  Referring Provider  Dr. Melvyn Novas      Encounter Date: 07/08/2019  Check In: Session Check In - 07/08/19 1121      Check-In   Supervising physician immediately available to respond to emergencies  [facility] immediately available    Physician(s)  Dr. Doristine Bosworth    Location  [facility]    Staff Present  Hoy Register, MS, Exercise Physiologist;Annedrea Rosezella [place], RN, MHA;Aashritha Miedema Ysidro Evert, RN    Virtual Visit  No    Medication changes reported      No    Fall or balance concerns reported     No    Tobacco Cessation  No Change    Warm-up and Cool-down  Performed on first and last piece of equipment    Resistance Training Performed  Yes    VAD Patient?  No    PAD/SET Patient?  No      Pain Assessment   Currently in Pain?  No/denies    Multiple Pain Sites  No       Capillary Blood Glucose: No results found for this or any previous visit (from the past 24 hour(s)).    Social History   Tobacco Use  Smoking Status Never Smoker  Smokeless Tobacco Never Used    Goals Met:  Exercise tolerated well No report of cardiac concerns or symptoms Strength training completed today  Goals Unmet:  Not Applicable  Comments: Service time is from 1015 to 1115    Dr. Rush Farmer is Medical Director for Pulmonary Rehab at [facility].

## 2019-07-12 ENCOUNTER — Inpatient Hospital Stay: Payer: Medicare Other | Attending: Family

## 2019-07-12 ENCOUNTER — Other Ambulatory Visit: Payer: Self-pay

## 2019-07-12 VITALS — BP 125/82 | HR 89 | Temp 97.1°F | Resp 17

## 2019-07-12 DIAGNOSIS — Z452 Encounter for adjustment and management of vascular access device: Secondary | ICD-10-CM | POA: Diagnosis not present

## 2019-07-12 DIAGNOSIS — Z95828 Presence of other vascular implants and grafts: Secondary | ICD-10-CM

## 2019-07-12 DIAGNOSIS — C37 Malignant neoplasm of thymus: Secondary | ICD-10-CM | POA: Diagnosis not present

## 2019-07-12 MED ORDER — SODIUM CHLORIDE 0.9% FLUSH
10.0000 mL | Freq: Once | INTRAVENOUS | Status: AC
  Administered 2019-07-12: 10 mL
  Filled 2019-07-12: qty 10

## 2019-07-12 MED ORDER — HEPARIN SOD (PORK) LOCK FLUSH 100 UNIT/ML IV SOLN
500.0000 [IU] | Freq: Once | INTRAVENOUS | Status: AC
  Administered 2019-07-12: 500 [IU] via INTRAVENOUS
  Filled 2019-07-12: qty 5

## 2019-07-12 NOTE — Patient Instructions (Signed)

## 2019-07-13 ENCOUNTER — Encounter (HOSPITAL_COMMUNITY)
Admission: RE | Admit: 2019-07-13 | Discharge: 2019-07-13 | Disposition: A | Discharge status: Home / Self Care | Payer: Medicare Other | Source: Ambulatory Visit | Attending: Pulmonary Disease | Admitting: Pulmonary Disease

## 2019-07-13 VITALS — Wt 235.9 lb

## 2019-07-13 DIAGNOSIS — R0609 Other forms of dyspnea: Secondary | ICD-10-CM | POA: Diagnosis not present

## 2019-07-13 NOTE — Progress Notes (Signed)
Daily Session Note  Patient Details  Name: Robert Kirby MRN: [id number] Date of Birth: 03-May-1951 Referring Provider:     Pulmonary Rehab Walk Test from 06/14/2019 in [place]  Referring Provider  Dr. Melvyn Novas      Encounter Date: 07/13/2019  Check In: Session Check In - 07/13/19 1135      Check-In   Supervising physician immediately available to respond to emergencies  [facility] immediately available    Physician(s)  Dr. Doristine Bosworth    Location  [facility]    Staff Present  Hoy Register, MS, Exercise Physiologist;Earleen Aoun Ysidro Evert, RN;Portia Rollene Rotunda, RN, BSN    Virtual Visit  No    Medication changes reported      No    Fall or balance concerns reported     No    Tobacco Cessation  No Change    Warm-up and Cool-down  Performed on first and last piece of equipment    Resistance Training Performed  Yes    VAD Patient?  No    PAD/SET Patient?  No      Pain Assessment   Currently in Pain?  No/denies    Multiple Pain Sites  No       Capillary Blood Glucose: No results found for this or any previous visit (from the past 24 hour(s)).  Exercise Prescription Changes - 07/13/19 1100      Response to Exercise   Blood Pressure (Admit)  132/84    Blood Pressure (Exercise)  132/70    Blood Pressure (Exit)  128/86    Heart Rate (Admit)  89 bpm    Heart Rate (Exercise)  108 bpm    Heart Rate (Exit)  99 bpm    Oxygen Saturation (Admit)  97 %    Oxygen Saturation (Exercise)  92 %    Oxygen Saturation (Exit)  96 %    Rating of Perceived Exertion (Exercise)  12    Perceived Dyspnea (Exercise)  1    Duration  Continue with 30 min of aerobic exercise without signs/symptoms of physical distress.    Intensity  THRR unchanged      Resistance Training   Training Prescription  Yes    Weight  blue bands    Reps  10-15    Time  10 Minutes      Interval Training   Interval Training  No      Treadmill   MPH  2    Grade  2    Minutes   15      T5 Nustep   Level  5    SPM  80    Minutes  15    METs  2       Social History   Tobacco Use  Smoking Status Never Smoker  Smokeless Tobacco Never Used    Goals Met:  Exercise tolerated well No report of cardiac concerns or symptoms Strength training completed today  Goals Unmet:  Not Applicable  Comments: Service time is from 1025 to 1125    Dr. Rush Farmer is Medical Director for Pulmonary Rehab at [facility].

## 2019-07-14 NOTE — Progress Notes (Signed)
Pulmonary Individual Treatment Plan  Patient Details  Name: Robert Kirby MRN: [id number] Date of Birth: 08-Dec-1950 Referring Provider:     Pulmonary Rehab Walk Test from 06/14/2019 in [place]  Referring Provider  Dr. Melvyn Novas      Initial Encounter Date:    Pulmonary Rehab Walk Test from 06/14/2019 in [place]  Date  06/14/19      Visit Diagnosis: Dyspnea on exertion  Patient's Home Medications on Admission:   Current Outpatient Medications:  .  ARIPiprazole (ABILIFY) 5 MG tablet, Take 5 mg by mouth daily., Disp: , Rfl:  .  busPIRone (BUSPAR) 15 MG tablet, Take 15 mg by mouth. 2 tabs in am, 2 tabs in pm, Disp: , Rfl:  .  carbamazepine (TEGRETOL) 200 MG tablet, Take 2 tablets in the am and 2 tablets in the pm, Disp: , Rfl:  .  CARVEDILOL PO, Take 1 tablet by mouth 2 (two) times daily., Disp: , Rfl:  .  clonazePAM (KLONOPIN) 0.5 MG tablet, Take 0.5 mg by mouth 2 (two) times daily., Disp: , Rfl:  .  escitalopram (LEXAPRO) 20 MG tablet, Take 20 mg by mouth daily., Disp: , Rfl:  .  fluticasone (FLONASE) 50 MCG/ACT nasal spray, SHAKE LIQUID AND USE 2 SPRAYS IN EACH NOSTRIL DAILY, Disp: 48 g, Rfl: 0 .  furosemide (LASIX) 20 MG tablet, Take 1 tablet by mouth daily., Disp: , Rfl:  .  hydrocortisone (CORTEF) 10 MG tablet, TAKE 2 TABLETS IN THE MORNING AND 1 TABLET IN THE EVENING FOR ADDISONS DISEASE, Disp: 270 tablet, Rfl: 3 .  L-Methylfolate-Algae (DEPLIN 15) 15-90.314 MG CAPS, Take 1 capsule by mouth daily., Disp: , Rfl:  .  lansoprazole (PREVACID) 15 MG capsule, TAKE 1 CAPSULE DAILY, Disp: 90 capsule, Rfl: 3 .  levocetirizine (XYZAL) 5 MG tablet, TAKE 1 TABLET(5 MG) BY MOUTH EVERY EVENING, Disp: 30 tablet, Rfl: 3 .  losartan (COZAAR) 25 MG tablet, Take 25 mg by mouth daily., Disp: , Rfl:  .  memantine (NAMENDA) 10 MG tablet, Take 10 mg by mouth 2 (two) times daily. , Disp: , Rfl:  .  mirtazapine (REMERON) 45 MG tablet, Take  90 mg by mouth at bedtime. , Disp: , Rfl:  .  riTUXimab (RITUXAN) 100 MG/10ML injection, Inject into the vein every 6 (six) months., Disp: , Rfl:  .  rosuvastatin (CRESTOR) 5 MG tablet, Take 5 mg by mouth daily., Disp: , Rfl:  .  tiZANidine (ZANAFLEX) 4 MG tablet, Take 1 tablet (4 mg total) by mouth every 6 (six) hours as needed for muscle spasms., Disp: 21 tablet, Rfl: 0 .  traMADol (ULTRAM) 50 MG tablet, Take 1 tablet (50 mg total) by mouth every 8 (eight) hours as needed (Pain)., Disp: 21 tablet, Rfl: 0 .  valproic acid (DEPAKENE) 250 MG capsule, Take 3 capsules in the am and 3 capsules in the pm, Disp: , Rfl:  .  warfarin (COUMADIN) 5 MG tablet, 5 mg at bedtime. As directed , Disp: , Rfl:   Past Medical History: Past Medical History:  Diagnosis Date  . Addison's disease ([place])   . Antiphospholipid antibody syndrome ([place])   . Arrhythmia    Pacemaker  . Autoimmune encephalomyelitis   . Depression   . GERD (gastroesophageal reflux disease)   . History of blood transfusion    13 units from a GI Bleed.   [place] [facility] History of GI bleed   . OSA on CPAP   .  Sleep apnea    on CPAP  . 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      Capillary Blood Glucose: No results found for: GLUCAP   Pulmonary Assessment Scores: Pulmonary Assessment Scores    Row Name 06/14/19 1053 06/14/19 1054       ADL UCSD   ADL Phase  Entry  -    SOB Score total  -  58      CAT Score   CAT Score  -  pre 16      mMRC Score   mMRC Score  2  -      UCSD: Self-administered rating of dyspnea associated with activities of daily living (ADLs) 6-point scale (0 = "not at all" to 5 = "maximal or unable to do because of breathlessness")  Scoring Scores range from 0 to 120.  Minimally important difference is 5 units  CAT: CAT can  identify the health impairment of COPD patients and is better correlated with disease progression.  CAT has a scoring range of zero to 40. The CAT score is classified into four groups of low (less than 10), medium (10 - 20), high (21-30) and very high (31-40) based on the impact level of disease on health status. A CAT score over 10 suggests significant symptoms.  A worsening CAT score could be explained by an exacerbation, poor medication adherence, poor inhaler technique, or progression of COPD or comorbid conditions.  CAT MCID is 2 points  mMRC: mMRC (Modified Medical Research Council) Dyspnea Scale is used to assess the degree of baseline functional disability in patients of respiratory disease due to dyspnea. No minimal important difference is established. A decrease in score of 1 point or greater is considered a positive change.   Pulmonary Function Assessment:   Exercise Target Goals: Exercise Program Goal: Individual exercise prescription set using results from initial 6 min walk test and THRR while considering  patient's activity barriers and safety.   Exercise Prescription Goal: Initial exercise prescription builds to 30-45 minutes a day of aerobic activity, 2-3 days per week.  Home exercise guidelines will be given to patient during program as part of exercise prescription that the participant will acknowledge.  Activity Barriers & Risk Stratification:   6 Minute Walk: 6 Minute Walk    Row Name 06/14/19 1053         6 Minute Walk   Phase  Initial     Distance  1020 feet     Walk Time  6 minutes     # of Rest Breaks  0     MPH  1.93     METS  2.45     RPE  12     Perceived Dyspnea   1     VO2 Peak  7.8     Symptoms  No     Resting HR  79 bpm     Resting BP  124/84     Resting Oxygen Saturation   96 %     Exercise Oxygen Saturation  during 6 min walk  90 %     Max Ex. HR  95 bpm     Max Ex. BP  134/88     2 Minute Post BP  112/80  Interval HR   1 Minute HR   92     2 Minute HR  95     3 Minute HR  94     4 Minute HR  95     5 Minute HR  94     6 Minute HR  95     2 Minute Post HR  84     Interval Heart Rate?  Yes       Interval Oxygen   Interval Oxygen?  Yes     Baseline Oxygen Saturation %  96 %     1 Minute Oxygen Saturation %  94 %     1 Minute Liters of Oxygen  0 L     2 Minute Oxygen Saturation %  94 %     2 Minute Liters of Oxygen  0 L     3 Minute Oxygen Saturation %  91 %     3 Minute Liters of Oxygen  0 L     4 Minute Oxygen Saturation %  90 %     4 Minute Liters of Oxygen  0 L     5 Minute Oxygen Saturation %  90 %     5 Minute Liters of Oxygen  0 L     6 Minute Oxygen Saturation %  91 %     6 Minute Liters of Oxygen  0 L     2 Minute Post Oxygen Saturation %  96 %     2 Minute Post Liters of Oxygen  0 L        Oxygen Initial Assessment: Oxygen Initial Assessment - 06/14/19 1053      Home Oxygen   Home Oxygen Device  Home Concentrator    Sleep Oxygen Prescription  CPAP    Home Exercise Oxygen Prescription  None    Home at Rest Exercise Oxygen Prescription  None    Compliance with Home Oxygen Use  Yes      Initial 6 min Walk   Oxygen Used  None      Program Oxygen Prescription   Program Oxygen Prescription  None      Intervention   Short Term Goals  To learn and demonstrate proper use of respiratory medications;To learn and demonstrate proper pursed lip breathing techniques or other breathing techniques.;To learn and understand importance of maintaining oxygen saturations>88%;To learn and understand importance of monitoring SPO2 with pulse oximeter and demonstrate accurate use of the pulse oximeter.;To learn and exhibit compliance with exercise, home and travel O2 prescription    Long  Term Goals  Compliance with respiratory medication;Exhibits proper breathing techniques, such as pursed lip breathing or other method taught during program session;Maintenance of O2 saturations>88%;Verbalizes importance of  monitoring SPO2 with pulse oximeter and return demonstration;Exhibits compliance with exercise, home and travel O2 prescription       Oxygen Re-Evaluation: Oxygen Re-Evaluation    Row Name 07/13/19 0738             Program Oxygen Prescription   Program Oxygen Prescription  None         Home Oxygen   Home Oxygen Device  Home Concentrator       Sleep Oxygen Prescription  CPAP       Home Exercise Oxygen Prescription  None       Home at Rest Exercise Oxygen Prescription  None       Compliance with Home Oxygen Use  Yes  Goals/Expected Outcomes   Short Term Goals  To learn and demonstrate proper use of respiratory medications;To learn and demonstrate proper pursed lip breathing techniques or other breathing techniques.;To learn and understand importance of maintaining oxygen saturations>88%;To learn and understand importance of monitoring SPO2 with pulse oximeter and demonstrate accurate use of the pulse oximeter.;To learn and exhibit compliance with exercise, home and travel O2 prescription       Long  Term Goals  Compliance with respiratory medication;Exhibits proper breathing techniques, such as pursed lip breathing or other method taught during program session;Maintenance of O2 saturations>88%;Verbalizes importance of monitoring SPO2 with pulse oximeter and return demonstration;Exhibits compliance with exercise, home and travel O2 prescription       Goals/Expected Outcomes  compliance          Oxygen Discharge (Final Oxygen Re-Evaluation): Oxygen Re-Evaluation - 07/13/19 0738      Program Oxygen Prescription   Program Oxygen Prescription  None      Home Oxygen   Home Oxygen Device  Home Concentrator    Sleep Oxygen Prescription  CPAP    Home Exercise Oxygen Prescription  None    Home at Rest Exercise Oxygen Prescription  None    Compliance with Home Oxygen Use  Yes      Goals/Expected Outcomes   Short Term Goals  To learn and demonstrate proper use of respiratory  medications;To learn and demonstrate proper pursed lip breathing techniques or other breathing techniques.;To learn and understand importance of maintaining oxygen saturations>88%;To learn and understand importance of monitoring SPO2 with pulse oximeter and demonstrate accurate use of the pulse oximeter.;To learn and exhibit compliance with exercise, home and travel O2 prescription    Long  Term Goals  Compliance with respiratory medication;Exhibits proper breathing techniques, such as pursed lip breathing or other method taught during program session;Maintenance of O2 saturations>88%;Verbalizes importance of monitoring SPO2 with pulse oximeter and return demonstration;Exhibits compliance with exercise, home and travel O2 prescription    Goals/Expected Outcomes  compliance       Initial Exercise Prescription: Initial Exercise Prescription - 06/14/19 1000      Date of Initial Exercise RX and Referring Provider   Date  06/14/19    Referring Provider  Dr. Melvyn Novas      Treadmill   MPH  1.8    Grade  1    Minutes  15      T5 Nustep   Level  2    SPM  80    Minutes  15      Prescription Details   Frequency (times per week)  2    Duration  Progress to 30 minutes of continuous aerobic without signs/symptoms of physical distress      Intensity   THRR 40-80% of Max Heartrate  61-122    Ratings of Perceived Exertion  11-13    Perceived Dyspnea  0-4      Progression   Progression  Continue progressive overload as per policy without signs/symptoms or physical distress.      Resistance Training   Training Prescription  Yes    Weight  blue bands    Reps  10-15       Perform Capillary Blood Glucose checks as needed.  Exercise Prescription Changes: Exercise Prescription Changes    Row Name 06/29/19 1100 07/13/19 1100           Response to Exercise   Blood Pressure (Admit)  114/70  132/84      Blood Pressure (Exercise)  124/60  132/70      Blood Pressure (Exit)  116/80  128/86       Heart Rate (Admit)  93 bpm  89 bpm      Heart Rate (Exercise)  101 bpm  108 bpm      Heart Rate (Exit)  96 bpm  99 bpm      Oxygen Saturation (Admit)  99 %  97 %      Oxygen Saturation (Exercise)  90 %  92 %      Oxygen Saturation (Exit)  97 %  96 %      Rating of Perceived Exertion (Exercise)  12  12      Perceived Dyspnea (Exercise)  1  1      Duration  Continue with 30 min of aerobic exercise without signs/symptoms of physical distress.  Continue with 30 min of aerobic exercise without signs/symptoms of physical distress.      Intensity  - 40-80% HRR  THRR unchanged        Progression   Progression  Continue to progress workloads to maintain intensity without signs/symptoms of physical distress.  -        Resistance Training   Training Prescription  Yes  Yes      Weight  blue bands  blue bands      Reps  10-15  10-15      Time  10 Minutes  10 Minutes        Interval Training   Interval Training  No  No        Treadmill   MPH  2  2      Grade  1  2      Minutes  15  15        T5 Nustep   Level  2  5      SPM  80  80      Minutes  15  15      METs  1.9  2         Exercise Comments:   Exercise Goals and Review: Exercise Goals    Row Name 06/14/19 1100 07/13/19 0738           Exercise Goals   Increase Physical Activity  Yes  Yes      Intervention  Provide advice, education, support and counseling about physical activity/exercise needs.;Develop an individualized exercise prescription for aerobic and resistive training based on initial evaluation findings, risk stratification, comorbidities and participant's personal goals.  Provide advice, education, support and counseling about physical activity/exercise needs.;Develop an individualized exercise prescription for aerobic and resistive training based on initial evaluation findings, risk stratification, comorbidities and participant's personal goals.      Expected Outcomes  Long Term: Exercising regularly at least 3-5  days a week.;Long Term: Add in home exercise to make exercise part of routine and to increase amount of physical activity.;Short Term: Attend rehab on a regular basis to increase amount of physical activity.  Long Term: Exercising regularly at least 3-5 days a week.;Long Term: Add in home exercise to make exercise part of routine and to increase amount of physical activity.;Short Term: Attend rehab on a regular basis to increase amount of physical activity.      Increase Strength and Stamina  Yes  Yes      Intervention  Provide advice, education, support and counseling about physical activity/exercise needs.;Develop an individualized exercise prescription for aerobic and resistive training based on initial evaluation findings, risk stratification,  comorbidities and participant's personal goals.  Provide advice, education, support and counseling about physical activity/exercise needs.;Develop an individualized exercise prescription for aerobic and resistive training based on initial evaluation findings, risk stratification, comorbidities and participant's personal goals.      Expected Outcomes  Short Term: Increase workloads from initial exercise prescription for resistance, speed, and METs.;Short Term: Perform resistance training exercises routinely during rehab and add in resistance training at home;Long Term: Improve cardiorespiratory fitness, muscular endurance and strength as measured by increased METs and functional capacity (6MWT)  Short Term: Increase workloads from initial exercise prescription for resistance, speed, and METs.;Short Term: Perform resistance training exercises routinely during rehab and add in resistance training at home;Long Term: Improve cardiorespiratory fitness, muscular endurance and strength as measured by increased METs and functional capacity (6MWT)      Able to understand and use rate of perceived exertion (RPE) scale  Yes  Yes      Intervention  Provide education and  explanation on how to use RPE scale  Provide education and explanation on how to use RPE scale      Expected Outcomes  Short Term: Able to use RPE daily in rehab to express subjective intensity level;Long Term:  Able to use RPE to guide intensity level when exercising independently  Short Term: Able to use RPE daily in rehab to express subjective intensity level;Long Term:  Able to use RPE to guide intensity level when exercising independently      Able to understand and use Dyspnea scale  Yes  Yes      Intervention  Provide education and explanation on how to use Dyspnea scale  Provide education and explanation on how to use Dyspnea scale      Expected Outcomes  Short Term: Able to use Dyspnea scale daily in rehab to express subjective sense of shortness of breath during exertion;Long Term: Able to use Dyspnea scale to guide intensity level when exercising independently  Short Term: Able to use Dyspnea scale daily in rehab to express subjective sense of shortness of breath during exertion;Long Term: Able to use Dyspnea scale to guide intensity level when exercising independently      Knowledge and understanding of Target Heart Rate Range (THRR)  Yes  Yes      Expected Outcomes  Short Term: Able to state/look up THRR;Short Term: Able to use daily as guideline for intensity in rehab;Long Term: Able to use THRR to govern intensity when exercising independently  Short Term: Able to state/look up THRR;Short Term: Able to use daily as guideline for intensity in rehab;Long Term: Able to use THRR to govern intensity when exercising independently      Understanding of Exercise Prescription  Yes  Yes      Intervention  Provide education, explanation, and written materials on patient's individual exercise prescription  Provide education, explanation, and written materials on patient's individual exercise prescription      Expected Outcomes  Short Term: Able to explain program exercise prescription;Long Term: Able to  explain home exercise prescription to exercise independently  Short Term: Able to explain program exercise prescription;Long Term: Able to explain home exercise prescription to exercise independently         Exercise Goals Re-Evaluation : Exercise Goals Re-Evaluation    Row Name 07/13/19 0738             Exercise Goal Re-Evaluation   Exercise Goals Review  Increase Physical Activity;Increase Strength and Stamina;Able to understand and use rate of perceived exertion (RPE) scale;Able  to understand and use Dyspnea scale;Knowledge and understanding of Target Heart Rate Range (THRR);Understanding of Exercise Prescription       Comments  Pt has completed 4 exercise sessions. Pt is reluctant for workload increases due to his self doubt. Progress will be slow, but he is slowly gaining confidence. Pt currently exercises at 3.08 METs on the stepper. Will continue to monitor and progress as able.       Expected Outcomes  Through exercise at rehab and at home, the patient will decrease shortness of breath with daily activities and feel confident in carrying out an exercise regime at home.          Discharge Exercise Prescription (Final Exercise Prescription Changes): Exercise Prescription Changes - 07/13/19 1100      Response to Exercise   Blood Pressure (Admit)  132/84    Blood Pressure (Exercise)  132/70    Blood Pressure (Exit)  128/86    Heart Rate (Admit)  89 bpm    Heart Rate (Exercise)  108 bpm    Heart Rate (Exit)  99 bpm    Oxygen Saturation (Admit)  97 %    Oxygen Saturation (Exercise)  92 %    Oxygen Saturation (Exit)  96 %    Rating of Perceived Exertion (Exercise)  12    Perceived Dyspnea (Exercise)  1    Duration  Continue with 30 min of aerobic exercise without signs/symptoms of physical distress.    Intensity  THRR unchanged      Resistance Training   Training Prescription  Yes    Weight  blue bands    Reps  10-15    Time  10 Minutes      Interval Training   Interval  Training  No      Treadmill   MPH  2    Grade  2    Minutes  15      T5 Nustep   Level  5    SPM  80    Minutes  15    METs  2       Nutrition:  Target Goals: Understanding of nutrition guidelines, daily intake of sodium '1500mg'$ , cholesterol '200mg'$ , calories 30% from fat and 7% or less from saturated fats, daily to have 5 or more servings of fruits and vegetables.  Biometrics: Pre Biometrics - 06/14/19 1052      Pre Biometrics   Grip Strength  42 kg        Nutrition Therapy Plan and Nutrition Goals:   Nutrition Assessments:   Nutrition Goals Re-Evaluation:   Nutrition Goals Discharge (Final Nutrition Goals Re-Evaluation):   Psychosocial: Target Goals: Acknowledge presence or absence of significant depression and/or stress, maximize coping skills, provide positive support system. Participant is able to verbalize types and ability to use techniques and skills needed for reducing stress and depression.  Initial Review & Psychosocial Screening: Initial Psych Review & Screening - 06/14/19 1100      Initial Review   Current issues with  Current Depression;History of Depression;Current Psychotropic Meds      Family Dynamics   Good Support System?  Yes    Comments  Is under the care of a psychiatrist @ [facility] for his current depression.  For the most part he feels it is under control.      Barriers   Psychosocial barriers to participate in program  There are no identifiable barriers or psychosocial needs.      Screening Interventions   Interventions  Encouraged to exercise    Expected Outcomes  --   For depression to remain stable      Quality of Life Scores:  Scores of 19 and below usually indicate a poorer quality of life in these areas.  A difference of  2-3 points is a clinically meaningful difference.  A difference of 2-3 points in the total score of the Quality of Life Index has been associated with significant improvement in overall quality of  life, self-image, physical symptoms, and general health in studies assessing change in quality of life.  PHQ-9: Recent Review Flowsheet Data    Depression screen [facility] 2/9 06/14/2019 11/27/2017   Decreased Interest 0 2   Down, Depressed, Hopeless 1 2   PHQ - 2 Score 1 4   Altered sleeping 0 0   Tired, decreased energy 1 0   Change in appetite 0 0   Feeling bad or failure about yourself  0 0   Trouble concentrating 0 0   Moving slowly or fidgety/restless 0 0   Suicidal thoughts 0 0   PHQ-9 Score - 4   Difficult doing work/chores Not difficult at all Somewhat difficult     Interpretation of Total Score  Total Score Depression Severity:  1-4 = Minimal depression, 5-9 = Mild depression, 10-14 = Moderate depression, 15-19 = Moderately severe depression, 20-27 = Severe depression   Psychosocial Evaluation and Intervention: Psychosocial Evaluation - 06/14/19 1104      Psychosocial Evaluation & Interventions   Interventions  Encouraged to exercise with the program and follow exercise prescription    Continue Psychosocial Services   --   Follow up with psychiatrist @ [facility] as already established      Psychosocial Re-Evaluation: Psychosocial Re-Evaluation    Row Name 06/14/19 1108 07/14/19 1057           Psychosocial Re-Evaluation   Current issues with  Current Depression;History of Depression;Current Psychotropic Meds  Current Depression;History of Depression;Current Psychotropic Meds;Current Stress Concerns      Comments  -  Is currently treated for depression, sees a Retail banker and psychiatrist @ [facility], feels depression is stable      Interventions  -  Stress management education;Encouraged to attend Pulmonary Rehabilitation for the exercise;Relaxation education      Continue Psychosocial Services   -  Follow up required by counselor      Comments  -  These stressors are ongoing, but patient feels some days are better than others        Initial Review   Source of Stress Concerns  -   Chronic Illness;Unable to participate in former interests or hobbies;Unable to perform yard/household activities         Psychosocial Discharge (Final Psychosocial Re-Evaluation): Psychosocial Re-Evaluation - 07/14/19 1057      Psychosocial Re-Evaluation   Current issues with  Current Depression;History of Depression;Current Psychotropic Meds;Current Stress Concerns    Comments  Is currently treated for depression, sees a Retail banker and psychiatrist @ [facility], feels depression is stable    Interventions  Stress management education;Encouraged to attend Pulmonary Rehabilitation for the exercise;Relaxation education    Continue Psychosocial Services   Follow up required by counselor    Comments  These stressors are ongoing, but patient feels some days are better than others      Initial Review   Source of Stress Concerns  Chronic Illness;Unable to participate in former interests or hobbies;Unable to perform yard/household activities       Education: Education Goals: Education  classes will be provided on a weekly basis, covering required topics. Participant will state understanding/return demonstration of topics presented.  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 Instructor discusses the definition of a risk factor, different risk factors for pulmonary disease, and how the heart and lungs work together.     Nutrition for Pulmonary Patient:  -Group instruction provided by PowerPoint slides, verbal discussion, and written materials to  support subject matter. The instructor gives an explanation and review of healthy diet recommendations, which includes a discussion on weight management, recommendations for fruit and vegetable consumption, as well as protein, fluid, caffeine, fiber, sodium, sugar, and alcohol. Tips for eating when patients are short of breath are discussed.   Pursed Lip Breathing:  -Group instruction that is supported by demonstration and informational handouts. Instructor discusses the benefits of pursed lip and diaphragmatic breathing and detailed demonstration on how to preform both.     Oxygen Safety:  -Group instruction provided by PowerPoint, verbal discussion, and written material to support subject matter. There is an overview of "What is Oxygen" and "Why do we need it".  Instructor also reviews how to create a safe environment for oxygen use, the importance of using oxygen as prescribed, and the risks of noncompliance. There is a brief discussion on traveling with oxygen and resources the patient may utilize.   Oxygen Equipment:  -Group instruction provided by [facility] Staff utilizing handouts, written materials, and equipment demonstrations.   Signs and Symptoms:  -Group instruction provided by written material and verbal discussion to support subject matter. Warning signs and symptoms of infection, stroke, and heart attack are reviewed and when to call the physician/911 reinforced. Tips for preventing the spread of infection discussed.   Advanced Directives:  -Group instruction provided by verbal instruction and written material to support subject matter. Instructor reviews Advanced Directive laws and proper instruction for filling out document.   Pulmonary Video:  -Group video education that reviews the importance of medication and oxygen compliance, exercise, good nutrition, pulmonary hygiene, and pursed lip and diaphragmatic breathing for the pulmonary patient.   Exercise for the  Pulmonary Patient:  -Group instruction that is supported by a PowerPoint presentation. Instructor discusses benefits of exercise, core components of exercise, frequency, duration, and intensity of an exercise routine, importance of utilizing pulse oximetry during exercise, safety while exercising, and options of places to exercise outside of rehab.     Pulmonary Medications:  -Verbally interactive group education provided by instructor with focus on inhaled medications and proper administration.   Anatomy and Physiology of the Respiratory System and Intimacy:  -Group instruction provided by PowerPoint, verbal discussion, and written material to support subject matter. Instructor reviews respiratory cycle and anatomical components of the respiratory system and their functions. Instructor also reviews differences in obstructive and restrictive respiratory diseases with examples of each. Intimacy, Sex, and Sexuality differences are reviewed with a discussion on how relationships can change when diagnosed with pulmonary disease. Common sexual concerns are reviewed.   MD DAY -A group question and answer session with a medical doctor that allows participants to ask questions that relate to their pulmonary disease state.  OTHER EDUCATION -Group or individual verbal, written, or video instructions that support the educational goals of the pulmonary rehab program.   Holiday Eating Survival Tips:  -Group instruction provided by PowerPoint slides, verbal discussion, and written materials to support subject matter. The instructor gives patients tips, tricks, and techniques to help them not only survive but enjoy the holidays despite the onslaught of food that accompanies the holidays.   Knowledge Questionnaire Score: Knowledge Questionnaire Score - 06/14/19 1110      Knowledge Questionnaire Score   Pre Score  15/18       Core Components/Risk Factors/Patient Goals at Admission: Personal Goals  and Risk Factors at Admission - 06/14/19 1110      Core Components/Risk Factors/Patient Goals on Admission   Improve shortness of breath with ADL's  Yes    Intervention  Provide education, individualized exercise plan and daily activity instruction to help decrease symptoms of SOB with activities of daily living.    Expected Outcomes  Short Term: Improve cardiorespiratory fitness to achieve a reduction of symptoms when performing ADLs;Long Term: Be able to perform more ADLs without symptoms or delay the onset of symptoms       Core Components/Risk Factors/Patient Goals Review:  Goals and Risk Factor Review    Row Name 06/14/19 1111 07/14/19 1100           Core Components/Risk Factors/Patient Goals Review   Personal Goals Review  Improve shortness of breath with ADL's;Develop more efficient breathing techniques such as purse lipped breathing and diaphragmatic breathing and practicing self-pacing with activity.  Increase knowledge of respiratory medications and ability to use respiratory devices properly.;Improve shortness of breath with ADL's;Develop more efficient breathing techniques such as purse lipped breathing and diaphragmatic breathing and practicing self-pacing with activity.;Stress      Review  -  Is doing well with the exercise sessions, stiil early in program to have met any program goals.      Expected Outcomes  -  See admission goals         Core Components/Risk Factors/Patient Goals at Discharge (Final Review):  Goals and Risk Factor Review - 07/14/19 1100      Core Components/Risk Factors/Patient Goals Review   Personal Goals Review  Increase knowledge of respiratory medications and ability to use respiratory devices properly.;Improve shortness of breath with ADL's;Develop more efficient breathing techniques such as purse lipped breathing and diaphragmatic breathing and practicing self-pacing with activity.;Stress    Review  Is doing well with the exercise sessions, stiil  early in program to have met any program goals.    Expected Outcomes  See admission goals       ITP Comments:   Comments: ITP REVIEW Pt is making expected progress toward pulmonary rehab goals after completing 5 sessions. Recommend continued exercise, life style modification, education, and utilization of breathing techniques to increase stamina and strength and decrease shortness of breath with exertion.

## 2019-07-15 ENCOUNTER — Encounter (HOSPITAL_COMMUNITY)
Admission: RE | Admit: 2019-07-15 | Discharge: 2019-07-15 | Disposition: A | Discharge status: Home / Self Care | Payer: Medicare Other | Source: Ambulatory Visit | Attending: Pulmonary Disease | Admitting: Pulmonary Disease

## 2019-07-15 ENCOUNTER — Other Ambulatory Visit: Payer: Self-pay

## 2019-07-15 DIAGNOSIS — R0609 Other forms of dyspnea: Secondary | ICD-10-CM | POA: Diagnosis not present

## 2019-07-15 NOTE — Progress Notes (Signed)
Daily Session Note  Patient Details  Name: Robert Kirby MRN: [id number] Date of Birth: 01-16-1951 Referring Provider:     Pulmonary Rehab Walk Test from 06/14/2019 in [place]  Referring Provider  Dr. Melvyn Novas      Encounter Date: 07/15/2019  Check In: Session Check In - 07/15/19 1204      Check-In   Supervising physician immediately available to respond to emergencies  [facility] immediately available    Physician(s)  Dr. Florene Glen    Location  [facility]    Staff Present  Rosebud Poles, RN, BSN;Carlette Wilber Oliphant, RN, Bjorn Loser, MS, Exercise Physiologist;Lisa Ysidro Evert, RN    Virtual Visit  No    Medication changes reported      No    Fall or balance concerns reported     No    Tobacco Cessation  No Change    Warm-up and Cool-down  Performed as group-led instruction    Resistance Training Performed  Yes    VAD Patient?  No    PAD/SET Patient?  No      Pain Assessment   Currently in Pain?  No/denies    Multiple Pain Sites  No       Capillary Blood Glucose: No results found for this or any previous visit (from the past 24 hour(s)).    Social History   Tobacco Use  Smoking Status Never Smoker  Smokeless Tobacco Never Used    Goals Met:  Proper associated with RPD/PD & O2 Sat Exercise tolerated well Strength training completed today  Goals Unmet:  Not Applicable  Comments: Service time is from 1010 to 1110.    Dr. Rush Farmer is Medical Director for Pulmonary Rehab at [facility].

## 2019-07-20 ENCOUNTER — Encounter (HOSPITAL_COMMUNITY)
Admission: RE | Admit: 2019-07-20 | Discharge: 2019-07-20 | Disposition: A | Discharge status: Home / Self Care | Payer: Medicare Other | Source: Ambulatory Visit | Attending: Pulmonary Disease | Admitting: Pulmonary Disease

## 2019-07-20 ENCOUNTER — Other Ambulatory Visit: Payer: Self-pay

## 2019-07-20 DIAGNOSIS — R0609 Other forms of dyspnea: Secondary | ICD-10-CM

## 2019-07-20 NOTE — Progress Notes (Signed)
I have reviewed a Home Exercise Prescription with Hollace Hayward . Keiden is currently exercising at home.  The patient was advised to walk, use the stepper, and do resistance bands 3 days a week for 45-60 minutes.  Antony Haste and I discussed how to progress their exercise prescription.  The patient stated that their goals were to lose some weight.  The patient stated that they understand the exercise prescription.  We reviewed exercise guidelines, target heart rate during exercise, RPE Scale, weather conditions, NTG use, endpoints for exercise, warmup and cool down.  Patient is encouraged to come to me with any questions. I will continue to follow up with the patient to assist them with progression and safety.    Hoy Register, M.S., ACSM CEP

## 2019-07-20 NOTE — Progress Notes (Signed)
Daily Session Note  Patient Details  Name: Robert Kirby MRN: [id number] Date of Birth: 11/04/1950 Referring Provider:     Pulmonary Rehab Walk Test from 06/14/2019 in [facility]  Referring Provider  Dr. Wert      Encounter Date: 07/20/2019  Check In: Session Check In - 07/20/19 1106      Check-In   Supervising physician immediately available to respond to emergencies  [facility] immediately available    Physician(s)  Dr. Powell    Location  [facility]    Staff Present  Joan Behrens, RN, BSN; , MS, Exercise Physiologist;Lisa Hughes, RN    Virtual Visit  No    Medication changes reported      No    Fall or balance concerns reported     No    Tobacco Cessation  No Change    Warm-up and Cool-down  Performed on first and last piece of equipment    Resistance Training Performed  Yes    VAD Patient?  No    PAD/SET Patient?  No      Pain Assessment   Currently in Pain?  No/denies    Multiple Pain Sites  No       Capillary Blood Glucose: No results found for this or any previous visit (from the past 24 hour(s)).    Social History   Tobacco Use  Smoking Status Never Smoker  Smokeless Tobacco Never Used    Goals Met:  Proper associated with RPD/PD & O2 Sat Exercise tolerated well Strength training completed today  Goals Unmet:  Not Applicable  Comments: Service time is from 1015 to 1120    Dr. Wesam G. Yacoub is Medical Director for Pulmonary Rehab at  Hospital. 

## 2019-07-22 ENCOUNTER — Other Ambulatory Visit: Payer: Self-pay

## 2019-07-22 ENCOUNTER — Encounter (HOSPITAL_COMMUNITY)
Admission: RE | Admit: 2019-07-22 | Discharge: 2019-07-22 | Disposition: A | Discharge status: Home / Self Care | Payer: Medicare Other | Source: Ambulatory Visit | Attending: Pulmonary Disease | Admitting: Pulmonary Disease

## 2019-07-22 DIAGNOSIS — R0609 Other forms of dyspnea: Secondary | ICD-10-CM

## 2019-07-22 NOTE — Progress Notes (Signed)
Daily Session Note  Patient Details  Name: Robert Kirby MRN: [id number] Date of Birth: 22-Nov-1950 Referring Provider:     Pulmonary Rehab Walk Test from 06/14/2019 in [place]  Referring Provider  Dr. Melvyn Novas      Encounter Date: 07/22/2019  Check In: Session Check In - 07/22/19 1103      Check-In   Supervising physician immediately available to respond to emergencies  [facility] immediately available    Physician(s)  Dr. Sarajane Jews    Location  [facility]    Staff Present  Rosebud Poles, RN, Bjorn Loser, MS, Exercise Physiologist;Lisa Ysidro Evert, RN    Virtual Visit  No    Medication changes reported      No    Fall or balance concerns reported     No    Tobacco Cessation  No Change    Warm-up and Cool-down  Performed as group-led instruction    Resistance Training Performed  Yes    VAD Patient?  No    PAD/SET Patient?  No      Pain Assessment   Currently in Pain?  No/denies    Multiple Pain Sites  No       Capillary Blood Glucose: No results found for this or any previous visit (from the past 24 hour(s)).    Social History   Tobacco Use  Smoking Status Never Smoker  Smokeless Tobacco Never Used    Goals Met:  Proper associated with RPD/PD & O2 Sat Exercise tolerated well Strength training completed today  Goals Unmet:  Not Applicable  Comments: Service time is from [place] to [place]    Dr. Rush Farmer is Medical Director for Pulmonary Rehab at [facility].

## 2019-07-23 DIAGNOSIS — I447 Left bundle-branch block, unspecified: Secondary | ICD-10-CM | POA: Diagnosis not present

## 2019-07-23 DIAGNOSIS — E7800 Pure hypercholesterolemia, unspecified: Secondary | ICD-10-CM | POA: Diagnosis not present

## 2019-07-23 DIAGNOSIS — R0600 Dyspnea, unspecified: Secondary | ICD-10-CM | POA: Diagnosis not present

## 2019-07-23 DIAGNOSIS — D6861 Antiphospholipid syndrome: Secondary | ICD-10-CM | POA: Diagnosis not present

## 2019-07-27 ENCOUNTER — Encounter (HOSPITAL_COMMUNITY)
Admission: RE | Admit: 2019-07-27 | Discharge: 2019-07-27 | Disposition: A | Discharge status: Home / Self Care | Payer: Medicare Other | Source: Ambulatory Visit | Attending: Pulmonary Disease | Admitting: Pulmonary Disease

## 2019-07-27 ENCOUNTER — Other Ambulatory Visit: Payer: Self-pay

## 2019-07-27 VITALS — Wt 230.2 lb

## 2019-07-27 DIAGNOSIS — R0609 Other forms of dyspnea: Secondary | ICD-10-CM

## 2019-07-27 DIAGNOSIS — Z7901 Long term (current) use of anticoagulants: Secondary | ICD-10-CM | POA: Diagnosis not present

## 2019-07-27 NOTE — Progress Notes (Signed)
Daily Session Note  Patient Details  Name: Robert Kirby MRN: [id number] Date of Birth: 1951/05/03 Referring Provider:     Pulmonary Rehab Walk Test from 06/14/2019 in [place]  Referring Provider  Dr. Melvyn Novas      Encounter Date: 07/27/2019  Check In:   Capillary Blood Glucose: No results found for this or any previous visit (from the past 24 hour(s)).  Exercise Prescription Changes - 07/27/19 1100      Response to Exercise   Blood Pressure (Admit)  110/70    Blood Pressure (Exercise)  108/66    Blood Pressure (Exit)  108/78    Heart Rate (Admit)  95 bpm    Heart Rate (Exercise)  113 bpm    Heart Rate (Exit)  102 bpm    Oxygen Saturation (Admit)  97 %    Oxygen Saturation (Exercise)  92 %    Oxygen Saturation (Exit)  98 %    Rating of Perceived Exertion (Exercise)  13    Perceived Dyspnea (Exercise)  1    Duration  Continue with 30 min of aerobic exercise without signs/symptoms of physical distress.    Intensity  THRR unchanged      Resistance Training   Training Prescription  Yes    Weight  blue bands    Reps  10-15    Time  10 Minutes      Interval Training   Interval Training  No      Treadmill   MPH  2.5    Grade  2    Minutes  15      T5 Nustep   Level  4    SPM  80    Minutes  15    METs  2.2       Social History   Tobacco Use  Smoking Status Never Smoker  Smokeless Tobacco Never Used    Goals Met:  Exercise tolerated well No report of cardiac concerns or symptoms Strength training completed today  Goals Unmet:  Not Applicable  Comments: Service time is from 1015 to 1117    Dr. Rush Farmer is Medical Director for Pulmonary Rehab at [facility].

## 2019-07-28 DIAGNOSIS — Z20828 Contact with and (suspected) exposure to other viral communicable diseases: Secondary | ICD-10-CM | POA: Diagnosis not present

## 2019-07-28 DIAGNOSIS — Z1159 Encounter for screening for other viral diseases: Secondary | ICD-10-CM | POA: Diagnosis not present

## 2019-07-29 ENCOUNTER — Encounter (HOSPITAL_COMMUNITY): Payer: Medicare Other

## 2019-08-02 DIAGNOSIS — F331 Major depressive disorder, recurrent, moderate: Secondary | ICD-10-CM | POA: Diagnosis not present

## 2019-08-03 ENCOUNTER — Encounter (HOSPITAL_COMMUNITY)
Admission: RE | Admit: 2019-08-03 | Discharge: 2019-08-03 | Disposition: A | Discharge status: Home / Self Care | Payer: Medicare Other | Source: Ambulatory Visit | Attending: Pulmonary Disease | Admitting: Pulmonary Disease

## 2019-08-03 ENCOUNTER — Other Ambulatory Visit: Payer: Self-pay

## 2019-08-03 VITALS — Wt 228.2 lb

## 2019-08-03 DIAGNOSIS — R0609 Other forms of dyspnea: Secondary | ICD-10-CM | POA: Diagnosis not present

## 2019-08-03 NOTE — Progress Notes (Signed)
Daily Session Note  Patient Details  Name: SIRAJ DERMODY MRN: [id number] Date of Birth: 1950/11/01 Referring Provider:     Pulmonary Rehab Walk Test from 06/14/2019 in [place]  Referring Provider  Dr. Melvyn Novas      Encounter Date: 08/03/2019  Check In: Session Check In - 08/03/19 1137      Check-In   Supervising physician immediately available to respond to emergencies  [facility] immediately available    Physician(s)  Dr. Tawanna Solo    Location  [facility]    Staff Present  Rosebud Poles, RN, Bjorn Loser, MS, Exercise Physiologist;Lisa Ysidro Evert, RN    Virtual Visit  No    Medication changes reported      No    Fall or balance concerns reported     No    Tobacco Cessation  No Change    Warm-up and Cool-down  Performed on first and last piece of equipment    Resistance Training Performed  Yes    VAD Patient?  No    PAD/SET Patient?  No      Pain Assessment   Currently in Pain?  No/denies    Multiple Pain Sites  No       Capillary Blood Glucose: No results found for this or any previous visit (from the past 24 hour(s)).    Social History   Tobacco Use  Smoking Status Never Smoker  Smokeless Tobacco Never Used    Goals Met:  Independence with exercise equipment Exercise tolerated well Strength training completed today  Goals Unmet:  Not Applicable  Comments: Service time is from 1020 to 1124    Dr. Rush Farmer is Medical Director for Pulmonary Rehab at [facility].

## 2019-08-05 ENCOUNTER — Other Ambulatory Visit: Payer: Self-pay

## 2019-08-05 ENCOUNTER — Encounter (HOSPITAL_COMMUNITY): Payer: Medicare Other

## 2019-08-06 ENCOUNTER — Encounter: Payer: Self-pay | Admitting: Family Medicine

## 2019-08-06 ENCOUNTER — Ambulatory Visit (INDEPENDENT_AMBULATORY_CARE_PROVIDER_SITE_OTHER): Payer: Medicare Other | Admitting: Family Medicine

## 2019-08-06 VITALS — BP 108/76 | HR 96 | Temp 96.9°F | Ht 72.0 in | Wt 225.5 lb

## 2019-08-06 DIAGNOSIS — K30 Functional dyspepsia: Secondary | ICD-10-CM | POA: Diagnosis not present

## 2019-08-06 DIAGNOSIS — M545 Low back pain, unspecified: Secondary | ICD-10-CM

## 2019-08-06 NOTE — Patient Instructions (Addendum)
Double the Prevacid for the next month or so.   Keep an eye on food triggers.   OK to take Tylenol 1000 mg (2 extra strength tabs) or 975 mg (3 regular strength tabs) every 6 hours as needed.  Let us know if you need anything.  EXERCISES  RANGE OF MOTION (ROM) AND STRETCHING EXERCISES - Low Back Pain Most people with lower back pain will find that their symptoms get worse with excessive bending forward (flexion) or arching at the lower back (extension). The exercises that will help resolve your symptoms will focus on the opposite motion.  If you have pain, numbness or tingling which travels down into your buttocks, leg or foot, the goal of the therapy is for these symptoms to move closer to your back and eventually resolve. Sometimes, these leg symptoms will get better, but your lower back pain may worsen. This is often an indication of progress in your rehabilitation. Be very alert to any changes in your symptoms and the activities in which you participated in the 24 hours prior to the change. Sharing this information with your caregiver will allow him or her to most efficiently treat your condition. These exercises may help you when beginning to rehabilitate your injury. Your symptoms may resolve with or without further involvement from your physician, physical therapist or athletic trainer. While completing these exercises, remember:   Restoring tissue flexibility helps normal motion to return to the joints. This allows healthier, less painful movement and activity.  An effective stretch should be held for at least 30 seconds.  A stretch should never be painful. You should only feel a gentle lengthening or release in the stretched tissue. FLEXION RANGE OF MOTION AND STRETCHING EXERCISES:  STRETCH - Flexion, Single Knee to Chest   Lie on a firm bed or floor with both legs extended in front of you.  Keeping one leg in contact with the floor, bring your opposite knee to your chest. Hold your  leg in place by either grabbing behind your thigh or at your knee.  Pull until you feel a gentle stretch in your low back. Hold 30 seconds.  Slowly release your grasp and repeat the exercise with the opposite side. Repeat 2 times. Complete this exercise 3 times per week.   STRETCH - Flexion, Double Knee to Chest  Lie on a firm bed or floor with both legs extended in front of you.  Keeping one leg in contact with the floor, bring your opposite knee to your chest.  Tense your stomach muscles to support your back and then lift your other knee to your chest. Hold your legs in place by either grabbing behind your thighs or at your knees.  Pull both knees toward your chest until you feel a gentle stretch in your low back. Hold 30 seconds.  Tense your stomach muscles and slowly return one leg at a time to the floor. Repeat 2 times. Complete this exercise 3 times per week.   STRETCH - Low Trunk Rotation  Lie on a firm bed or floor. Keeping your legs in front of you, bend your knees so they are both pointed toward the ceiling and your feet are flat on the floor.  Extend your arms out to the side. This will stabilize your upper body by keeping your shoulders in contact with the floor.  Gently and slowly drop both knees together to one side until you feel a gentle stretch in your low back. Hold for 30 seconds.  Tense your stomach muscles to support your lower back as you bring your knees back to the starting position. Repeat the exercise to the other side. Repeat 2 times. Complete this exercise at least 3 times per week.   EXTENSION RANGE OF MOTION AND FLEXIBILITY EXERCISES:  STRETCH - Extension, Prone on Elbows   Lie on your stomach on the floor, a bed will be too soft. Place your palms about shoulder width apart and at the height of your head.  Place your elbows under your shoulders. If this is too painful, stack pillows under your chest.  Allow your body to relax so that your hips drop  lower and make contact more completely with the floor.  Hold this position for 30 seconds.  Slowly return to lying flat on the floor. Repeat 2 times. Complete this exercise 3 times per week.   RANGE OF MOTION - Extension, Prone Press Ups  Lie on your stomach on the floor, a bed will be too soft. Place your palms about shoulder width apart and at the height of your head.  Keeping your back as relaxed as possible, slowly straighten your elbows while keeping your hips on the floor. You may adjust the placement of your hands to maximize your comfort. As you gain motion, your hands will come more underneath your shoulders.  Hold this position 30 seconds.  Slowly return to lying flat on the floor. Repeat 2 times. Complete this exercise 3 times per week.   RANGE OF MOTION- Quadruped, Neutral Spine   Assume a hands and knees position on a firm surface. Keep your hands under your shoulders and your knees under your hips. You may place padding under your knees for comfort.  Drop your head and point your tailbone toward the ground below you. This will round out your lower back like an angry cat. Hold this position for 30 seconds.  Slowly lift your head and release your tail bone so that your back sags into a large arch, like an old horse.  Hold this position for 30 seconds.  Repeat this until you feel limber in your low back.  Now, find your "sweet spot." This will be the most comfortable position somewhere between the two previous positions. This is your neutral spine. Once you have found this position, tense your stomach muscles to support your low back.  Hold this position for 30 seconds. Repeat 2 times. Complete this exercise 3 times per week.   STRENGTHENING EXERCISES - Low Back Sprain These exercises may help you when beginning to rehabilitate your injury. These exercises should be done near your "sweet spot." This is the neutral, low-back arch, somewhere between fully rounded and  fully arched, that is your least painful position. When performed in this safe range of motion, these exercises can be used for people who have either a flexion or extension based injury. These exercises may resolve your symptoms with or without further involvement from your physician, physical therapist or athletic trainer. While completing these exercises, remember:   Muscles can gain both the endurance and the strength needed for everyday activities through controlled exercises.  Complete these exercises as instructed by your physician, physical therapist or athletic trainer. Increase the resistance and repetitions only as guided.  You may experience muscle soreness or fatigue, but the pain or discomfort you are trying to eliminate should never worsen during these exercises. If this pain does worsen, stop and make certain you are following the directions exactly. If the pain is  still present after adjustments, discontinue the exercise until you can discuss the trouble with your caregiver.  STRENGTHENING - Deep Abdominals, Pelvic Tilt   Lie on a firm bed or floor. Keeping your legs in front of you, bend your knees so they are both pointed toward the ceiling and your feet are flat on the floor.  Tense your lower abdominal muscles to press your low back into the floor. This motion will rotate your pelvis so that your tail bone is scooping upwards rather than pointing at your feet or into the floor. With a gentle tension and even breathing, hold this position for 3 seconds. Repeat 2 times. Complete this exercise 3 times per week.   STRENGTHENING - Abdominals, Crunches   Lie on a firm bed or floor. Keeping your legs in front of you, bend your knees so they are both pointed toward the ceiling and your feet are flat on the floor. Cross your arms over your chest.  Slightly tip your chin down without bending your neck.  Tense your abdominals and slowly lift your trunk high enough to just clear your  shoulder blades. Lifting higher can put excessive stress on the lower back and does not further strengthen your abdominal muscles.  Control your return to the starting position. Repeat 2 times. Complete this exercise 3 times per week.   STRENGTHENING - Quadruped, Opposite UE/LE Lift   Assume a hands and knees position on a firm surface. Keep your hands under your shoulders and your knees under your hips. You may place padding under your knees for comfort.  Find your neutral spine and gently tense your abdominal muscles so that you can maintain this position. Your shoulders and hips should form a rectangle that is parallel with the floor and is not twisted.  Keeping your trunk steady, lift your right hand no higher than your shoulder and then your left leg no higher than your hip. Make sure you are not holding your breath. Hold this position for 30 seconds.  Continuing to keep your abdominal muscles tense and your back steady, slowly return to your starting position. Repeat with the opposite arm and leg. Repeat 2 times. Complete this exercise 3 times per week.   STRENGTHENING - Abdominals and Quadriceps, Straight Leg Raise   Lie on a firm bed or floor with both legs extended in front of you.  Keeping one leg in contact with the floor, bend the other knee so that your foot can rest flat on the floor.  Find your neutral spine, and tense your abdominal muscles to maintain your spinal position throughout the exercise.  Slowly lift your straight leg off the floor about 6 inches for a count of 3, making sure to not hold your breath.  Still keeping your neutral spine, slowly lower your leg all the way to the floor. Repeat this exercise with each leg 2 times. Complete this exercise 3 times per week.  POSTURE AND BODY MECHANICS CONSIDERATIONS - Low Back Sprain Keeping correct posture when sitting, standing or completing your activities will reduce the stress put on different body tissues,  allowing injured tissues a chance to heal and limiting painful experiences. The following are general guidelines for improved posture.  While reading these guidelines, remember:  The exercises prescribed by your provider will help you have the flexibility and strength to maintain correct postures.  The correct posture provides the best environment for your joints to work. All of your joints have less wear and tear when  properly supported by a spine with good posture. This means you will experience a healthier, less painful body.  Correct posture must be practiced with all of your activities, especially prolonged sitting and standing. Correct posture is as important when doing repetitive low-stress activities (typing) as it is when doing a single heavy-load activity (lifting).  RESTING POSITIONS Consider which positions are most painful for you when choosing a resting position. If you have pain with flexion-based activities (sitting, bending, stooping, squatting), choose a position that allows you to rest in a less flexed posture. You would want to avoid curling into a fetal position on your side. If your pain worsens with extension-based activities (prolonged standing, working overhead), avoid resting in an extended position such as sleeping on your stomach. Most people will find more comfort when they rest with their spine in a more neutral position, neither too rounded nor too arched. Lying on a non-sagging bed on your side with a pillow between your knees, or on your back with a pillow under your knees will often provide some relief. Keep in mind, being in any one position for a prolonged period of time, no matter how correct your posture, can still lead to stiffness.  PROPER SITTING POSTURE In order to minimize stress and discomfort on your spine, you must sit with correct posture. Sitting with good posture should be effortless for a healthy body. Returning to good posture is a gradual process. Many  people can work toward this most comfortably by using various supports until they have the flexibility and strength to maintain this posture on their own. When sitting with proper posture, your ears will fall over your shoulders and your shoulders will fall over your hips. You should use the back of the chair to support your upper back. Your lower back will be in a neutral position, just slightly arched. You may place a small pillow or folded towel at the base of your lower back for  support.  When working at a desk, create an environment that supports good, upright posture. Without extra support, muscles tire, which leads to excessive strain on joints and other tissues. Keep these recommendations in mind:  CHAIR:  A chair should be able to slide under your desk when your back makes contact with the back of the chair. This allows you to work closely.  The chair's height should allow your eyes to be level with the upper part of your monitor and your hands to be slightly lower than your elbows.  BODY POSITION  Your feet should make contact with the floor. If this is not possible, use a foot rest.  Keep your ears over your shoulders. This will reduce stress on your neck and low back.  INCORRECT SITTING POSTURES  If you are feeling tired and unable to assume a healthy sitting posture, do not slouch or slump. This puts excessive strain on your back tissues, causing more damage and pain. Healthier options include:  Using more support, like a lumbar pillow.  Switching tasks to something that requires you to be upright or walking.  Talking a brief walk.  Lying down to rest in a neutral-spine position.  PROLONGED STANDING WHILE SLIGHTLY LEANING FORWARD  When completing a task that requires you to lean forward while standing in one place for a long time, place either foot up on a stationary 2-4 inch high object to help maintain the best posture. When both feet are on the ground, the lower back  tends to lose  its slight inward curve. If this curve flattens (or becomes too large), then the back and your other joints will experience too much stress, tire more quickly, and can cause pain.  CORRECT STANDING POSTURES Proper standing posture should be assumed with all daily activities, even if they only take a few moments, like when brushing your teeth. As in sitting, your ears should fall over your shoulders and your shoulders should fall over your hips. You should keep a slight tension in your abdominal muscles to brace your spine. Your tailbone should point down to the ground, not behind your body, resulting in an over-extended swayback posture.   INCORRECT STANDING POSTURES  Common incorrect standing postures include a forward head, locked knees and/or an excessive swayback. WALKING Walk with an upright posture. Your ears, shoulders and hips should all line-up.  PROLONGED ACTIVITY IN A FLEXED POSITION When completing a task that requires you to bend forward at your waist or lean over a low surface, try to find a way to stabilize 3 out of 4 of your limbs. You can place a hand or elbow on your thigh or rest a knee on the surface you are reaching across. This will provide you more stability, so that your muscles do not tire as quickly. By keeping your knees relaxed, or slightly bent, you will also reduce stress across your lower back. CORRECT LIFTING TECHNIQUES  DO :  Assume a wide stance. This will provide you more stability and the opportunity to get as close as possible to the object which you are lifting.  Tense your abdominals to brace your spine. Bend at the knees and hips. Keeping your back locked in a neutral-spine position, lift using your leg muscles. Lift with your legs, keeping your back straight.  Test the weight of unknown objects before attempting to lift them.  Try to keep your elbows locked down at your sides in order get the best strength from your shoulders when carrying  an object.     Always ask for help when lifting heavy or awkward objects. INCORRECT LIFTING TECHNIQUES DO NOT:   Lock your knees when lifting, even if it is a small object.  Bend and twist. Pivot at your feet or move your feet when needing to change directions.  Assume that you can safely pick up even a paperclip without proper posture.

## 2019-08-06 NOTE — Progress Notes (Signed)
[phone] 

## 2019-08-06 NOTE — Progress Notes (Signed)
Chief Complaint  Patient presents with  . Nausea    burning in belly    Subjective: Patient is a 68 y.o. male here for burning in his stomach. Here w wife who helps with the hx.   For the past 2 weeks, pt has been experiencing a belly ache. Sometimes will have nausea and some burning, the latter seems to be getting better. Nausea worse during late AM and afternoon. Sometimes will have loose stools. Denies vomiting, bowel changes, urinary complaints, rashes or fevers. Took Maalox without sig relief. Levbid that was 68 yrs old was helpful. Had dental surgery 8 d ago.   ROS: GI: As noted in HPI  Past Medical History:  Diagnosis Date  . Addison's disease ([place])   . Antiphospholipid antibody syndrome ([place])   . Arrhythmia    Pacemaker  . Autoimmune encephalomyelitis   . Depression   . GERD (gastroesophageal reflux disease)   . History of blood transfusion    13 units from a GI Bleed.   Marland Kitchen History of GI bleed   . OSA on CPAP   . Sleep apnea    on CPAP  . 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 No accessory muscle use MSK: No ttp, no deformity Neuro: Gait nml, no cerebellar signs Abd: BS+, S, NT, ND Psych: Age appropriate judgment and insight, normal affect and mood  Assessment and Plan: Burning sensation of stomach  Acute midline low back pain without sciatica  1- increase dose of Prevacid to 30 mg/d. Mind triggers. 2- Stretches/exercises, Tylenol, heat, ice. F/u in 2 weeks if no improvement. The patient and his wife voiced understanding and agreement to the plan.  Gordon, DO 08/06/19  3:44 PM

## 2019-08-09 ENCOUNTER — Telehealth (HOSPITAL_COMMUNITY): Payer: Self-pay | Admitting: Family Medicine

## 2019-08-10 ENCOUNTER — Encounter (HOSPITAL_COMMUNITY)
Admission: RE | Admit: 2019-08-10 | Discharge: 2019-08-10 | Disposition: A | Discharge status: Home / Self Care | Payer: Medicare Other | Source: Ambulatory Visit | Attending: Pulmonary Disease | Admitting: Pulmonary Disease

## 2019-08-10 DIAGNOSIS — F331 Major depressive disorder, recurrent, moderate: Secondary | ICD-10-CM | POA: Diagnosis not present

## 2019-08-10 NOTE — Progress Notes (Signed)
Pulmonary Individual Treatment Plan  Patient Details  Name: Robert Kirby MRN: [id number] Date of Birth: 11-20-1950 Referring Provider:     Pulmonary Rehab Walk Test from 06/14/2019 in [place]  Referring Provider  Dr. Melvyn Novas      Initial Encounter Date:    Pulmonary Rehab Walk Test from 06/14/2019 in [place]  Date  06/14/19      Visit Diagnosis: Dyspnea on exertion  Patient's Home Medications on Admission:   Current Outpatient Medications:  .  amphetamine-dextroamphetamine (ADDERALL) 10 MG tablet, Take 10 mg by mouth daily., Disp: , Rfl:  .  busPIRone (BUSPAR) 30 MG tablet, Take 30 mg by mouth 2 (two) times daily., Disp: , Rfl:  .  carbamazepine (TEGRETOL) 200 MG tablet, Take 2 tablets in the am and 2 tablets in the pm, Disp: , Rfl:  .  CARVEDILOL PO, Take 1 tablet by mouth 2 (two) times daily., Disp: , Rfl:  .  clonazePAM (KLONOPIN) 0.5 MG tablet, Take 0.5 mg by mouth 2 (two) times daily., Disp: , Rfl:  .  escitalopram (LEXAPRO) 20 MG tablet, Take 20 mg by mouth daily., Disp: , Rfl:  .  fluticasone (FLONASE) 50 MCG/ACT nasal spray, SHAKE LIQUID AND USE 2 SPRAYS IN EACH NOSTRIL DAILY, Disp: 48 g, Rfl: 0 .  furosemide (LASIX) 20 MG tablet, Take 1 tablet by mouth daily., Disp: , Rfl:  .  hydrocortisone (CORTEF) 10 MG tablet, TAKE 2 TABLETS IN THE MORNING AND 1 TABLET IN THE EVENING FOR ADDISONS DISEASE, Disp: 270 tablet, Rfl: 3 .  lansoprazole (PREVACID) 15 MG capsule, TAKE 1 CAPSULE DAILY, Disp: 90 capsule, Rfl: 3 .  losartan (COZAAR) 25 MG tablet, Take 25 mg by mouth daily., Disp: , Rfl:  .  mirtazapine (REMERON) 45 MG tablet, Take 90 mg by mouth at bedtime. , Disp: , Rfl:  .  riTUXimab (RITUXAN) 100 MG/10ML injection, Inject into the vein every 6 (six) months., Disp: , Rfl:  .  rosuvastatin (CRESTOR) 5 MG tablet, Take 5 mg by mouth daily., Disp: , Rfl:  .  tiZANidine (ZANAFLEX) 4 MG tablet, Take 1 tablet (4 mg  total) by mouth every 6 (six) hours as needed for muscle spasms., Disp: 21 tablet, Rfl: 0 .  valproic acid (DEPAKENE) 250 MG capsule, Take 3 capsules in the am and 3 capsules in the pm, Disp: , Rfl:  .  warfarin (COUMADIN) 5 MG tablet, 5 mg at bedtime. As directed , Disp: , Rfl:   Past Medical History: Past Medical History:  Diagnosis Date  . Addison's disease ([place])   . Antiphospholipid antibody syndrome ([place])   . Arrhythmia    Pacemaker  . Autoimmune encephalomyelitis   . Depression   . GERD (gastroesophageal reflux disease)   . History of blood transfusion    13 units from a GI Bleed.   [facility] History of GI bleed   . OSA on CPAP   . Sleep apnea    on CPAP  . 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      Capillary Blood Glucose: No results found for: GLUCAP   Pulmonary Assessment Scores: Pulmonary Assessment Scores    Row Name 06/14/19 1053 06/14/19 1054       ADL  UCSD   ADL Phase  Entry  -    SOB Score total  -  58      CAT Score   CAT Score  -  pre 16      mMRC Score   mMRC Score  2  -      UCSD: Self-administered rating of dyspnea associated with activities of daily living (ADLs) 6-point scale (0 = "not at all" to 5 = "maximal or unable to do because of breathlessness")  Scoring Scores range from 0 to 120.  Minimally important difference is 5 units  CAT: CAT can identify the health impairment of COPD patients and is better correlated with disease progression.  CAT has a scoring range of zero to 40. The CAT score is classified into four groups of low (less than 10), medium (10 - 20), high (21-30) and very high (31-40) based on the impact level of disease on health status. A CAT score over 10 suggests significant symptoms.  A worsening CAT score could be explained by an exacerbation, poor  medication adherence, poor inhaler technique, or progression of COPD or comorbid conditions.  CAT MCID is 2 points  mMRC: mMRC (Modified Medical Research Council) Dyspnea Scale is used to assess the degree of baseline functional disability in patients of respiratory disease due to dyspnea. No minimal important difference is established. A decrease in score of 1 point or greater is considered a positive change.   Pulmonary Function Assessment:   Exercise Target Goals: Exercise Program Goal: Individual exercise prescription set using results from initial 6 min walk test and THRR while considering  patient's activity barriers and safety.   Exercise Prescription Goal: Initial exercise prescription builds to 30-45 minutes a day of aerobic activity, 2-3 days per week.  Home exercise guidelines will be given to patient during program as part of exercise prescription that the participant will acknowledge.  Activity Barriers & Risk Stratification:   6 Minute Walk: 6 Minute Walk    Row Name 06/14/19 1053         6 Minute Walk   Phase  Initial     Distance  1020 feet     Walk Time  6 minutes     # of Rest Breaks  0     MPH  1.93     METS  2.45     RPE  12     Perceived Dyspnea   1     VO2 Peak  7.8     Symptoms  No     Resting HR  79 bpm     Resting BP  124/84     Resting Oxygen Saturation   96 %     Exercise Oxygen Saturation  during 6 min walk  90 %     Max Ex. HR  95 bpm     Max Ex. BP  134/88     2 Minute Post BP  112/80       Interval HR   1 Minute HR  92     2 Minute HR  95     3 Minute HR  94     4 Minute HR  95     5 Minute HR  94     6 Minute HR  95     2 Minute Post HR  84     Interval Heart Rate?  Yes       Interval Oxygen   Interval Oxygen?  Yes  Baseline Oxygen Saturation %  96 %     1 Minute Oxygen Saturation %  94 %     1 Minute Liters of Oxygen  0 L     2 Minute Oxygen Saturation %  94 %     2 Minute Liters of Oxygen  0 L     3 Minute Oxygen  Saturation %  91 %     3 Minute Liters of Oxygen  0 L     4 Minute Oxygen Saturation %  90 %     4 Minute Liters of Oxygen  0 L     5 Minute Oxygen Saturation %  90 %     5 Minute Liters of Oxygen  0 L     6 Minute Oxygen Saturation %  91 %     6 Minute Liters of Oxygen  0 L     2 Minute Post Oxygen Saturation %  96 %     2 Minute Post Liters of Oxygen  0 L        Oxygen Initial Assessment: Oxygen Initial Assessment - 06/14/19 1053      Home Oxygen   Home Oxygen Device  Home Concentrator    Sleep Oxygen Prescription  CPAP    Home Exercise Oxygen Prescription  None    Home at Rest Exercise Oxygen Prescription  None    Compliance with Home Oxygen Use  Yes      Initial 6 min Walk   Oxygen Used  None      Program Oxygen Prescription   Program Oxygen Prescription  None      Intervention   Short Term Goals  To learn and demonstrate proper use of respiratory medications;To learn and demonstrate proper pursed lip breathing techniques or other breathing techniques.;To learn and understand importance of maintaining oxygen saturations>88%;To learn and understand importance of monitoring SPO2 with pulse oximeter and demonstrate accurate use of the pulse oximeter.;To learn and exhibit compliance with exercise, home and travel O2 prescription    Long  Term Goals  Compliance with respiratory medication;Exhibits proper breathing techniques, such as pursed lip breathing or other method taught during program session;Maintenance of O2 saturations>88%;Verbalizes importance of monitoring SPO2 with pulse oximeter and return demonstration;Exhibits compliance with exercise, home and travel O2 prescription       Oxygen Re-Evaluation: Oxygen Re-Evaluation    Row Name 07/13/19 0738 08/10/19 0753           Program Oxygen Prescription   Program Oxygen Prescription  None  None        Home Oxygen   Home Oxygen Device  Home Concentrator  Home Concentrator      Sleep Oxygen Prescription  CPAP  CPAP       Home Exercise Oxygen Prescription  None  None      Home at Rest Exercise Oxygen Prescription  None  None      Compliance with Home Oxygen Use  Yes  Yes        Goals/Expected Outcomes   Short Term Goals  To learn and demonstrate proper use of respiratory medications;To learn and demonstrate proper pursed lip breathing techniques or other breathing techniques.;To learn and understand importance of maintaining oxygen saturations>88%;To learn and understand importance of monitoring SPO2 with pulse oximeter and demonstrate accurate use of the pulse oximeter.;To learn and exhibit compliance with exercise, home and travel O2 prescription  To learn and demonstrate proper use of respiratory medications;To learn and demonstrate proper pursed lip  breathing techniques or other breathing techniques.;To learn and understand importance of maintaining oxygen saturations>88%;To learn and understand importance of monitoring SPO2 with pulse oximeter and demonstrate accurate use of the pulse oximeter.;To learn and exhibit compliance with exercise, home and travel O2 prescription      Long  Term Goals  Compliance with respiratory medication;Exhibits proper breathing techniques, such as pursed lip breathing or other method taught during program session;Maintenance of O2 saturations>88%;Verbalizes importance of monitoring SPO2 with pulse oximeter and return demonstration;Exhibits compliance with exercise, home and travel O2 prescription  Compliance with respiratory medication;Exhibits proper breathing techniques, such as pursed lip breathing or other method taught during program session;Maintenance of O2 saturations>88%;Verbalizes importance of monitoring SPO2 with pulse oximeter and return demonstration;Exhibits compliance with exercise, home and travel O2 prescription      Goals/Expected Outcomes  compliance  compliance         Oxygen Discharge (Final Oxygen Re-Evaluation): Oxygen Re-Evaluation - 08/10/19 0753       Program Oxygen Prescription   Program Oxygen Prescription  None      Home Oxygen   Home Oxygen Device  Home Concentrator    Sleep Oxygen Prescription  CPAP    Home Exercise Oxygen Prescription  None    Home at Rest Exercise Oxygen Prescription  None    Compliance with Home Oxygen Use  Yes      Goals/Expected Outcomes   Short Term Goals  To learn and demonstrate proper use of respiratory medications;To learn and demonstrate proper pursed lip breathing techniques or other breathing techniques.;To learn and understand importance of maintaining oxygen saturations>88%;To learn and understand importance of monitoring SPO2 with pulse oximeter and demonstrate accurate use of the pulse oximeter.;To learn and exhibit compliance with exercise, home and travel O2 prescription    Long  Term Goals  Compliance with respiratory medication;Exhibits proper breathing techniques, such as pursed lip breathing or other method taught during program session;Maintenance of O2 saturations>88%;Verbalizes importance of monitoring SPO2 with pulse oximeter and return demonstration;Exhibits compliance with exercise, home and travel O2 prescription    Goals/Expected Outcomes  compliance       Initial Exercise Prescription: Initial Exercise Prescription - 06/14/19 1000      Date of Initial Exercise RX and Referring Provider   Date  06/14/19    Referring Provider  Dr. Melvyn Novas      Treadmill   MPH  1.8    Grade  1    Minutes  15      T5 Nustep   Level  2    SPM  80    Minutes  15      Prescription Details   Frequency (times per week)  2    Duration  Progress to 30 minutes of continuous aerobic without signs/symptoms of physical distress      Intensity   THRR 40-80% of Max Heartrate  61-122    Ratings of Perceived Exertion  11-13    Perceived Dyspnea  0-4      Progression   Progression  Continue progressive overload as per policy without signs/symptoms or physical distress.      Resistance Training    Training Prescription  Yes    Weight  blue bands    Reps  10-15       Perform Capillary Blood Glucose checks as needed.  Exercise Prescription Changes: Exercise Prescription Changes    Row Name 06/29/19 1100 07/13/19 1100 07/20/19 1100 07/27/19 1100 08/03/19 1149     Response to Exercise  Blood Pressure (Admit)  114/70  132/84  -  110/70  106/70   Blood Pressure (Exercise)  124/60  132/70  -  108/66  106/70   Blood Pressure (Exit)  116/80  128/86  -  108/78  124/82   Heart Rate (Admit)  93 bpm  89 bpm  -  95 bpm  88 bpm   Heart Rate (Exercise)  101 bpm  108 bpm  -  113 bpm  110 bpm   Heart Rate (Exit)  96 bpm  99 bpm  -  102 bpm  98 bpm   Oxygen Saturation (Admit)  99 %  97 %  -  97 %  98 %   Oxygen Saturation (Exercise)  90 %  92 %  -  92 %  93 %   Oxygen Saturation (Exit)  97 %  96 %  -  98 %  98 %   Rating of Perceived Exertion (Exercise)  12  12  -  13  13   Perceived Dyspnea (Exercise)  1  1  -  1  2   Duration  Continue with 30 min of aerobic exercise without signs/symptoms of physical distress.  Continue with 30 min of aerobic exercise without signs/symptoms of physical distress.  -  Continue with 30 min of aerobic exercise without signs/symptoms of physical distress.  Continue with 30 min of aerobic exercise without signs/symptoms of physical distress.   Intensity  - 40-80% HRR  THRR unchanged  -  THRR unchanged  THRR unchanged     Progression   Progression  Continue to progress workloads to maintain intensity without signs/symptoms of physical distress.  -  -  -  -     Resistance Training   Training Prescription  Yes  Yes  -  Yes  Yes   Weight  blue bands  blue bands  -  blue bands  blue bands   Reps  10-15  10-15  -  10-15  10-15   Time  10 Minutes  10 Minutes  -  10 Minutes  10 Minutes     Interval Training   Interval Training  No  No  -  No  No     Treadmill   MPH  2  2  -  2.5  2.5   Grade  1  2  -  2  2   Minutes  15  15  -  15  15     T5 Nustep   Level  2   5  -  4  4   SPM  80  80  -  80  80   Minutes  15  15  -  15  15   METs  1.9  2  -  2.2  2.2     Home Exercise Plan   Plans to continue exercise at  -  -  Home (comment)  -  -   Frequency  -  -  Add 3 additional days to program exercise sessions.  -  -   Initial Home Exercises Provided  -  -  07/20/19  -  -      Exercise Comments: Exercise Comments    Row Name 07/20/19 1142           Exercise Comments  home exercise complete          Exercise Goals and Review: Exercise Goals    Row Name 06/14/19 1100 07/13/19 5462  08/10/19 0754         Exercise Goals   Increase Physical Activity  Yes  Yes  Yes     Intervention  Provide advice, education, support and counseling about physical activity/exercise needs.;Develop an individualized exercise prescription for aerobic and resistive training based on initial evaluation findings, risk stratification, comorbidities and participant's personal goals.  Provide advice, education, support and counseling about physical activity/exercise needs.;Develop an individualized exercise prescription for aerobic and resistive training based on initial evaluation findings, risk stratification, comorbidities and participant's personal goals.  Provide advice, education, support and counseling about physical activity/exercise needs.;Develop an individualized exercise prescription for aerobic and resistive training based on initial evaluation findings, risk stratification, comorbidities and participant's personal goals.     Expected Outcomes  Long Term: Exercising regularly at least 3-5 days a week.;Long Term: Add in home exercise to make exercise part of routine and to increase amount of physical activity.;Short Term: Attend rehab on a regular basis to increase amount of physical activity.  Long Term: Exercising regularly at least 3-5 days a week.;Long Term: Add in home exercise to make exercise part of routine and to increase amount of physical activity.;Short Term:  Attend rehab on a regular basis to increase amount of physical activity.  Long Term: Exercising regularly at least 3-5 days a week.;Long Term: Add in home exercise to make exercise part of routine and to increase amount of physical activity.;Short Term: Attend rehab on a regular basis to increase amount of physical activity.     Increase Strength and Stamina  Yes  Yes  Yes     Intervention  Provide advice, education, support and counseling about physical activity/exercise needs.;Develop an individualized exercise prescription for aerobic and resistive training based on initial evaluation findings, risk stratification, comorbidities and participant's personal goals.  Provide advice, education, support and counseling about physical activity/exercise needs.;Develop an individualized exercise prescription for aerobic and resistive training based on initial evaluation findings, risk stratification, comorbidities and participant's personal goals.  Provide advice, education, support and counseling about physical activity/exercise needs.;Develop an individualized exercise prescription for aerobic and resistive training based on initial evaluation findings, risk stratification, comorbidities and participant's personal goals.     Expected Outcomes  Short Term: Increase workloads from initial exercise prescription for resistance, speed, and METs.;Short Term: Perform resistance training exercises routinely during rehab and add in resistance training at home;Long Term: Improve cardiorespiratory fitness, muscular endurance and strength as measured by increased METs and functional capacity (6MWT)  Short Term: Increase workloads from initial exercise prescription for resistance, speed, and METs.;Short Term: Perform resistance training exercises routinely during rehab and add in resistance training at home;Long Term: Improve cardiorespiratory fitness, muscular endurance and strength as measured by increased METs and functional  capacity (6MWT)  Short Term: Increase workloads from initial exercise prescription for resistance, speed, and METs.;Short Term: Perform resistance training exercises routinely during rehab and add in resistance training at home;Long Term: Improve cardiorespiratory fitness, muscular endurance and strength as measured by increased METs and functional capacity (6MWT)     Able to understand and use rate of perceived exertion (RPE) scale  Yes  Yes  Yes     Intervention  Provide education and explanation on how to use RPE scale  Provide education and explanation on how to use RPE scale  Provide education and explanation on how to use RPE scale     Expected Outcomes  Short Term: Able to use RPE daily in rehab to express subjective intensity level;Long Term:  Able  to use RPE to guide intensity level when exercising independently  Short Term: Able to use RPE daily in rehab to express subjective intensity level;Long Term:  Able to use RPE to guide intensity level when exercising independently  Short Term: Able to use RPE daily in rehab to express subjective intensity level;Long Term:  Able to use RPE to guide intensity level when exercising independently     Able to understand and use Dyspnea scale  Yes  Yes  Yes     Intervention  Provide education and explanation on how to use Dyspnea scale  Provide education and explanation on how to use Dyspnea scale  Provide education and explanation on how to use Dyspnea scale     Expected Outcomes  Short Term: Able to use Dyspnea scale daily in rehab to express subjective sense of shortness of breath during exertion;Long Term: Able to use Dyspnea scale to guide intensity level when exercising independently  Short Term: Able to use Dyspnea scale daily in rehab to express subjective sense of shortness of breath during exertion;Long Term: Able to use Dyspnea scale to guide intensity level when exercising independently  Short Term: Able to use Dyspnea scale daily in rehab to express  subjective sense of shortness of breath during exertion;Long Term: Able to use Dyspnea scale to guide intensity level when exercising independently     Knowledge and understanding of Target Heart Rate Range (THRR)  Yes  Yes  Yes     Expected Outcomes  Short Term: Able to state/look up THRR;Short Term: Able to use daily as guideline for intensity in rehab;Long Term: Able to use THRR to govern intensity when exercising independently  Short Term: Able to state/look up THRR;Short Term: Able to use daily as guideline for intensity in rehab;Long Term: Able to use THRR to govern intensity when exercising independently  Short Term: Able to state/look up THRR;Short Term: Able to use daily as guideline for intensity in rehab;Long Term: Able to use THRR to govern intensity when exercising independently     Understanding of Exercise Prescription  Yes  Yes  Yes     Intervention  Provide education, explanation, and written materials on patient's individual exercise prescription  Provide education, explanation, and written materials on patient's individual exercise prescription  Provide education, explanation, and written materials on patient's individual exercise prescription     Expected Outcomes  Short Term: Able to explain program exercise prescription;Long Term: Able to explain home exercise prescription to exercise independently  Short Term: Able to explain program exercise prescription;Long Term: Able to explain home exercise prescription to exercise independently  Short Term: Able to explain program exercise prescription;Long Term: Able to explain home exercise prescription to exercise independently        Exercise Goals Re-Evaluation : Exercise Goals Re-Evaluation    Row Name 07/13/19 0738 08/10/19 0754           Exercise Goal Re-Evaluation   Exercise Goals Review  Increase Physical Activity;Increase Strength and Stamina;Able to understand and use rate of perceived exertion (RPE) scale;Able to understand  and use Dyspnea scale;Knowledge and understanding of Target Heart Rate Range (THRR);Understanding of Exercise Prescription  Increase Physical Activity;Increase Strength and Stamina;Able to understand and use rate of perceived exertion (RPE) scale;Able to understand and use Dyspnea scale;Knowledge and understanding of Target Heart Rate Range (THRR);Understanding of Exercise Prescription      Comments  Pt has completed 4 exercise sessions. Pt is reluctant for workload increases due to his self doubt. Progress will be  slow, but he is slowly gaining confidence. Pt currently exercises at 3.08 METs on the stepper. Will continue to monitor and progress as able.  Pt has completed 10 exercise sessions. Pt has been more accepting of workload increases. Pt currently exercises at 3.6 METs on the treadmill. Will continue to monitor and progress as able.      Expected Outcomes  Through exercise at rehab and at home, the patient will decrease shortness of breath with daily activities and feel confident in carrying out an exercise regime at home.  Through exercise at rehab and at home, the patient will decrease shortness of breath with daily activities and feel confident in carrying out an exercise regime at home.         Discharge Exercise Prescription (Final Exercise Prescription Changes): Exercise Prescription Changes - 08/03/19 1149      Response to Exercise   Blood Pressure (Admit)  106/70    Blood Pressure (Exercise)  106/70    Blood Pressure (Exit)  124/82    Heart Rate (Admit)  88 bpm    Heart Rate (Exercise)  110 bpm    Heart Rate (Exit)  98 bpm    Oxygen Saturation (Admit)  98 %    Oxygen Saturation (Exercise)  93 %    Oxygen Saturation (Exit)  98 %    Rating of Perceived Exertion (Exercise)  13    Perceived Dyspnea (Exercise)  2    Duration  Continue with 30 min of aerobic exercise without signs/symptoms of physical distress.    Intensity  THRR unchanged      Resistance Training   Training  Prescription  Yes    Weight  blue bands    Reps  10-15    Time  10 Minutes      Interval Training   Interval Training  No      Treadmill   MPH  2.5    Grade  2    Minutes  15      T5 Nustep   Level  4    SPM  80    Minutes  15    METs  2.2       Nutrition:  Target Goals: Understanding of nutrition guidelines, daily intake of sodium <1561m, cholesterol <2081m calories 30% from fat and 7% or less from saturated fats, daily to have 5 or more servings of fruits and vegetables.  Biometrics: Pre Biometrics - 06/14/19 1052      Pre Biometrics   Grip Strength  42 kg        Nutrition Therapy Plan and Nutrition Goals:   Nutrition Assessments:   Nutrition Goals Re-Evaluation:   Nutrition Goals Discharge (Final Nutrition Goals Re-Evaluation):   Psychosocial: Target Goals: Acknowledge presence or absence of significant depression and/or stress, maximize coping skills, provide positive support system. Participant is able to verbalize types and ability to use techniques and skills needed for reducing stress and depression.  Initial Review & Psychosocial Screening: Initial Psych Review & Screening - 06/14/19 1100      Initial Review   Current issues with  Current Depression;History of Depression;Current Psychotropic Meds      Family Dynamics   Good Support System?  Yes    Comments  [facility].  For the most part he feels it is under control.      Barriers   Psychosocial barriers to participate in program  There are no identifiable  barriers or psychosocial needs.      Screening Interventions   Interventions  Encouraged to exercise    Expected Outcomes  --   For depression to remain stable      Quality of Life Scores:  Scores of 19 and below usually indicate a poorer quality of life in these areas.  A difference of  2-3 points is a clinically meaningful difference.  A difference of 2-3 points  in the total score of the Quality of Life Index has been associated with significant improvement in overall quality of life, self-image, physical symptoms, and general health in studies assessing change in quality of life.  PHQ-9: Recent Review Flowsheet Data    Depression screen [facility] 2/9 06/14/2019 11/27/2017   Decreased Interest 0 2   Down, Depressed, Hopeless 1 2   PHQ - 2 Score 1 4   Altered sleeping 0 0   Tired, decreased energy 1 0   Change in appetite 0 0   Feeling bad or failure about yourself  0 0   Trouble concentrating 0 0   Moving slowly or fidgety/restless 0 0   Suicidal thoughts 0 0   PHQ-9 Score - 4   Difficult doing work/chores Not difficult at all Somewhat difficult     Interpretation of Total Score  Total Score Depression Severity:  1-4 = Minimal depression, 5-9 = Mild depression, 10-14 = Moderate depression, 15-19 = Moderately severe depression, 20-27 = Severe depression   Psychosocial Evaluation and Intervention: Psychosocial Evaluation - 06/14/19 1104      Psychosocial Evaluation & Interventions   Interventions  Encouraged to exercise with the program and follow exercise prescription    Continue Psychosocial Services   --   Follow up with psychiatrist @ [facility] as already established      Psychosocial Re-Evaluation: Psychosocial Re-Evaluation    Row Name 06/14/19 1108 07/14/19 1057 08/09/19 1052         Psychosocial Re-Evaluation   Current issues with  Current Depression;History of Depression;Current Psychotropic Meds  Current Depression;History of Depression;Current Psychotropic Meds;Current Stress Concerns  Current Depression;History of Depression     Comments  -  Is currently treated for depression, sees a Retail banker and psychiatrist @ [place], feels depression is stable  Tiffany has a history of depression and current depression, he feels it is stable and is followed by a psychiatrist and a therapist @ [place].  He has a positive oulook and seems to enjoy exercising  in pulmonary rehab.     Expected Outcomes  -  -  For patient to continue to have a positive outlook and for his depression to be stable.     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       Initial Review   Source of Stress Concerns  -  Chronic Illness;Unable to participate in former interests or hobbies;Unable to perform yard/household activities  Unable to perform yard/household activities;Unable to participate in former interests or hobbies;Chronic Illness        Psychosocial Discharge (Final Psychosocial  Re-Evaluation): Psychosocial Re-Evaluation - 08/09/19 1052      Psychosocial Re-Evaluation   Current issues with  Current Depression;History of Depression    Comments  Jefrey has a history of depression and current depression, he feels it is stable and is followed by a psychiatrist and a therapist @ [place].  He has a positive oulook and seems to enjoy exercising in pulmonary rehab.    Expected Outcomes  For patient to continue to have a positive outlook and for his depression to be stable.    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      Initial Review   Source of Stress Concerns  Unable to perform yard/household activities;Unable to  participate in former interests or hobbies;Chronic Illness       Education: Education Goals: Education classes will be provided on a weekly basis, covering required topics. Participant will state understanding/return demonstration of topics presented.  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 Instructor discusses the definition of a risk factor, different risk factors for pulmonary disease, and how the heart and lungs work together.     Nutrition for Pulmonary Patient:  -Group instruction provided by PowerPoint slides, verbal discussion, and written materials to support subject matter. The instructor gives an explanation and review of healthy diet recommendations, which includes a discussion on weight management, recommendations for fruit and vegetable consumption, as well as protein, fluid, caffeine, fiber, sodium, sugar, and alcohol. Tips for eating when patients are short of breath are discussed.   Pursed Lip Breathing:  -Group instruction that is supported by demonstration and informational handouts. Instructor discusses the benefits of pursed lip and diaphragmatic breathing and detailed demonstration on how to preform both.     Oxygen Safety:  -Group instruction provided by PowerPoint, verbal discussion, and written material to support subject matter. There is an overview of "What is Oxygen" and "Why do we need it".  Instructor also reviews how to create a safe environment for oxygen use, the importance of using oxygen as prescribed,  and the risks of noncompliance. There is a brief discussion on traveling with oxygen and resources the patient may utilize.   Oxygen Equipment:  -Group instruction provided by [facility] Staff utilizing handouts, written materials, and equipment demonstrations.   Signs and Symptoms:  -Group instruction provided by written material and verbal discussion to support subject matter. Warning signs and symptoms of infection, stroke, and heart attack are reviewed and when to call the physician/911 reinforced. Tips for preventing the spread of infection discussed.   Advanced Directives:  -Group instruction provided by verbal instruction and written material to support subject matter. Instructor reviews Advanced Directive laws and proper instruction for filling out document.   Pulmonary Video:  -Group video education that reviews the importance of medication and oxygen compliance, exercise, good nutrition, pulmonary hygiene, and pursed lip and diaphragmatic breathing for the pulmonary patient.   Exercise for  the Pulmonary Patient:  -Group instruction that is supported by a PowerPoint presentation. Instructor discusses benefits of exercise, core components of exercise, frequency, duration, and intensity of an exercise routine, importance of utilizing pulse oximetry during exercise, safety while exercising, and options of places to exercise outside of rehab.     Pulmonary Medications:  -Verbally interactive group education provided by instructor with focus on inhaled medications and proper administration.   Anatomy and Physiology of the Respiratory System and Intimacy:  -Group instruction provided by PowerPoint, verbal discussion, and written material to support subject matter. Instructor reviews respiratory cycle and anatomical components of the respiratory system and their functions. Instructor also reviews differences in obstructive and restrictive respiratory diseases with examples of each.  Intimacy, Sex, and Sexuality differences are reviewed with a discussion on how relationships can change when diagnosed with pulmonary disease. Common sexual concerns are reviewed.   MD DAY -A group question and answer session with a medical doctor that allows participants to ask questions that relate to their pulmonary disease state.   OTHER EDUCATION -Group or individual verbal, written, or video instructions that support the educational goals of the pulmonary rehab program.   Holiday Eating Survival Tips:  -Group instruction provided by PowerPoint slides, verbal discussion, and written materials to support subject matter. The instructor gives patients tips, tricks, and techniques to help them not only survive but enjoy the holidays despite the onslaught of food that accompanies the holidays.   Knowledge Questionnaire Score: Knowledge Questionnaire Score - 06/14/19 1110      Knowledge Questionnaire Score   Pre Score  15/18       Core Components/Risk Factors/Patient Goals at Admission: Personal Goals and Risk Factors at Admission - 06/14/19 1110      Core Components/Risk Factors/Patient Goals on Admission   Improve shortness of breath with ADL's  Yes    Intervention  Provide education, individualized exercise plan and daily activity instruction to help decrease symptoms of SOB with activities of daily living.    Expected Outcomes  Short Term: Improve cardiorespiratory fitness to achieve a reduction of symptoms when performing ADLs;Long Term: Be able to perform more ADLs without symptoms or delay the onset of symptoms       Core Components/Risk Factors/Patient Goals Review:  Goals and Risk Factor Review    Row Name 06/14/19 1111 07/14/19 1100 08/09/19 1100         Core Components/Risk Factors/Patient Goals Review   Personal Goals Review  Improve shortness of breath with ADL's;Develop more efficient breathing techniques such as purse lipped breathing and diaphragmatic breathing  and practicing self-pacing with activity.  Increase knowledge of respiratory medications and ability to use respiratory devices properly.;Improve shortness of breath with ADL's;Develop more efficient breathing techniques such as purse lipped breathing and diaphragmatic breathing and practicing self-pacing with activity.;Stress  Develop more efficient breathing techniques such as purse lipped breathing and diaphragmatic breathing and practicing self-pacing with activity.;Increase knowledge of respiratory medications and ability to use respiratory devices properly.;Improve shortness of breath with ADL's     Review  -  Is doing well with the exercise sessions, stiil early in program to have met any program goals.  Making great progress, walking on treadmill @ 2.5 mph and 2% grade, and level 4 on the nustep, seems to enjoy coming to the program.     Expected Outcomes  -  See admission goals  See admission goals.        Core Components/Risk Factors/Patient Goals at Discharge (Final Review):  Goals and Risk Factor Review - 08/09/19 1100      Core Components/Risk Factors/Patient Goals Review   Personal Goals Review  Develop more efficient breathing techniques such as purse lipped breathing and diaphragmatic breathing and practicing self-pacing with activity.;Increase knowledge of respiratory medications and ability to use respiratory devices properly.;Improve shortness of breath with ADL's    Review  Making great progress, walking on treadmill @ 2.5 mph and 2% grade, and level 4 on the nustep, seems to enjoy coming to the program.    Expected Outcomes  See admission goals.       ITP Comments:   Comments: ITP REVIEW Pt is making expected progress toward pulmonary rehab goals after completing 10 sessions. Recommend continued exercise, life style modification, education, and utilization of breathing techniques to increase stamina and strength and decrease shortness of breath with exertion.

## 2019-08-11 ENCOUNTER — Telehealth (HOSPITAL_COMMUNITY): Payer: Self-pay | Admitting: Family Medicine

## 2019-08-12 ENCOUNTER — Telehealth (HOSPITAL_COMMUNITY): Payer: Self-pay | Admitting: *Deleted

## 2019-08-12 ENCOUNTER — Encounter (HOSPITAL_COMMUNITY): Payer: Medicare Other

## 2019-08-12 NOTE — Telephone Encounter (Signed)
Called to check on patient, he has been exposed to [employer] through his brother in law.  He was instructed to be tested and that even if he tests negative he needs to be absent for 14 days before coming back to pulmonary rehab.

## 2019-08-13 DIAGNOSIS — Z20828 Contact with and (suspected) exposure to other viral communicable diseases: Secondary | ICD-10-CM | POA: Diagnosis not present

## 2019-08-17 ENCOUNTER — Encounter (HOSPITAL_COMMUNITY): Payer: Medicare Other

## 2019-08-23 ENCOUNTER — Inpatient Hospital Stay: Payer: Medicare Other | Attending: Family

## 2019-08-23 ENCOUNTER — Other Ambulatory Visit: Payer: Self-pay

## 2019-08-23 VITALS — BP 127/84 | HR 97 | Temp 97.5°F | Resp 17

## 2019-08-23 DIAGNOSIS — Z95828 Presence of other vascular implants and grafts: Secondary | ICD-10-CM

## 2019-08-23 DIAGNOSIS — C37 Malignant neoplasm of thymus: Secondary | ICD-10-CM | POA: Diagnosis not present

## 2019-08-23 DIAGNOSIS — Z452 Encounter for adjustment and management of vascular access device: Secondary | ICD-10-CM | POA: Insufficient documentation

## 2019-08-23 MED ORDER — HEPARIN SOD (PORK) LOCK FLUSH 100 UNIT/ML IV SOLN
500.0000 [IU] | Freq: Once | INTRAVENOUS | Status: AC
  Administered 2019-08-23: 10:00:00 500 [IU] via INTRAVENOUS
  Filled 2019-08-23: qty 5

## 2019-08-23 MED ORDER — SODIUM CHLORIDE 0.9% FLUSH
10.0000 mL | Freq: Once | INTRAVENOUS | Status: AC
  Administered 2019-08-23: 10 mL via INTRAVENOUS
  Filled 2019-08-23: qty 10

## 2019-08-23 NOTE — Patient Instructions (Signed)

## 2019-08-24 ENCOUNTER — Telehealth (HOSPITAL_COMMUNITY): Payer: Self-pay

## 2019-08-26 ENCOUNTER — Telehealth (HOSPITAL_COMMUNITY): Payer: Self-pay | Admitting: Family Medicine

## 2019-08-30 NOTE — Addendum Note (Signed)
Encounter addended by: Lance Morin, RN on: 08/30/2019 11:58 AM  Actions taken: Clinical Note Signed, Episode resolved

## 2019-08-30 NOTE — Progress Notes (Signed)
Discharge Progress Report  Patient Details  Name: CORNELIS KLUVER MRN: [id number] Date of Birth: 1950/10/18 Referring Provider:     Pulmonary Rehab Walk Test from 06/14/2019 in [facility]  Referring Provider  Dr. Melvyn Novas       Number of Visits: 10  Reason for Discharge:  Early Exit:  due to poor attendance related to MD appointments and COVID-19 exposure  Smoking History:  Social History   Tobacco Use  Smoking Status Never Smoker  Smokeless Tobacco Never Used    Diagnosis:  Dyspnea on exertion  ADL [facility]: Pulmonary Assessment Scores    Row Name 06/14/19 1053 06/14/19 1054       ADL [facility]SD   ADL Phase  Entry  -    SOB Score total  -  58      CAT Score   CAT Score  -  pre 16      mMRC Score   mMRC Score  2  -       Initial Exercise Prescription: Initial Exercise Prescription - 06/14/19 1000      Date of Initial Exercise RX and Referring Provider   Date  06/14/19    Referring Provider  Dr. Melvyn Novas      Treadmill   MPH  1.8    Grade  1    Minutes  15      T5 Nustep   Level  2    SPM  80    Minutes  15      Prescription Details   Frequency (times per week)  2    Duration  Progress to 30 minutes of continuous aerobic without signs/symptoms of physical distress      Intensity   THRR 40-80% of Max Heartrate  61-122    Ratings of Perceived Exertion  11-13    Perceived Dyspnea  0-4      Progression   Progression  Continue progressive overload as per policy without signs/symptoms or physical distress.      Resistance Training   Training Prescription  Yes    Weight  blue bands    Reps  10-15       Discharge Exercise Prescription (Final Exercise Prescription Changes): Exercise Prescription Changes - 08/03/19 1149      Response to Exercise   Blood Pressure (Admit)  106/70    Blood Pressure (Exercise)  106/70    Blood Pressure (Exit)  124/82    Heart Rate (Admit)  88 bpm    Heart Rate (Exercise)  110 bpm    Heart Rate  (Exit)  98 bpm    Oxygen Saturation (Admit)  98 %    Oxygen Saturation (Exercise)  93 %    Oxygen Saturation (Exit)  98 %    Rating of Perceived Exertion (Exercise)  13    Perceived Dyspnea (Exercise)  2    Duration  Continue with 30 min of aerobic exercise without signs/symptoms of physical distress.    Intensity  THRR unchanged      Resistance Training   Training Prescription  Yes    Weight  blue bands    Reps  10-15    Time  10 Minutes      Interval Training   Interval Training  No      Treadmill   MPH  2.5    Grade  2    Minutes  15      T5 Nustep   Level  4  SPM  80    Minutes  15    METs  2.2       Functional Capacity: 6 Minute Walk    Row Name 06/14/19 1053         6 Minute Walk   Phase  Initial     Distance  1020 feet     Walk Time  6 minutes     # of Rest Breaks  0     MPH  1.93     METS  2.45     RPE  12     Perceived Dyspnea   1     VO2 Peak  7.8     Symptoms  No     Resting HR  79 bpm     Resting BP  124/84     Resting Oxygen Saturation   96 %     Exercise Oxygen Saturation  during 6 min walk  90 %     Max Ex. HR  95 bpm     Max Ex. BP  134/88     2 Minute Post BP  112/80       Interval HR   1 Minute HR  92     2 Minute HR  95     3 Minute HR  94     4 Minute HR  95     5 Minute HR  94     6 Minute HR  95     2 Minute Post HR  84     Interval Heart Rate?  Yes       Interval Oxygen   Interval Oxygen?  Yes     Baseline Oxygen Saturation %  96 %     1 Minute Oxygen Saturation %  94 %     1 Minute Liters of Oxygen  0 L     2 Minute Oxygen Saturation %  94 %     2 Minute Liters of Oxygen  0 L     3 Minute Oxygen Saturation %  91 %     3 Minute Liters of Oxygen  0 L     4 Minute Oxygen Saturation %  90 %     4 Minute Liters of Oxygen  0 L     5 Minute Oxygen Saturation %  90 %     5 Minute Liters of Oxygen  0 L     6 Minute Oxygen Saturation %  91 %     6 Minute Liters of Oxygen  0 L     2 Minute Post Oxygen Saturation %  96 %      2 Minute Post Liters of Oxygen  0 L        Psychological, QOL, Others - Outcomes: PHQ 2/9: Depression screen [facility] 2/9 06/14/2019 11/27/2017  Decreased Interest 0 2  Down, Depressed, Hopeless 1 2  PHQ - 2 Score 1 4  Altered sleeping 0 0  Tired, decreased energy 1 0  Change in appetite 0 0  Feeling bad or failure about yourself  0 0  Trouble concentrating 0 0  Moving slowly or fidgety/restless 0 0  Suicidal thoughts 0 0  PHQ-9 Score - 4  Difficult doing work/chores Not difficult at all Somewhat difficult    Quality of Life:   Personal Goals: Goals established at orientation with interventions provided to work toward goal. Personal Goals and Risk Factors at Admission - 06/14/19 1110  Core Components/Risk Factors/Patient Goals on Admission   Improve shortness of breath with ADL's  Yes    Intervention  Provide education, individualized exercise plan and daily activity instruction to help decrease symptoms of SOB with activities of daily living.    Expected Outcomes  Short Term: Improve cardiorespiratory fitness to achieve a reduction of symptoms when performing ADLs;Long Term: Be able to perform more ADLs without symptoms or delay the onset of symptoms        Personal Goals Discharge: Goals and Risk Factor Review    Row Name 06/14/19 1111 07/14/19 1100 08/09/19 1100         Core Components/Risk Factors/Patient Goals Review   Personal Goals Review  Improve shortness of breath with ADL's;Develop more efficient breathing techniques such as purse lipped breathing and diaphragmatic breathing and practicing self-pacing with activity.  Increase knowledge of respiratory medications and ability to use respiratory devices properly.;Improve shortness of breath with ADL's;Develop more efficient breathing techniques such as purse lipped breathing and diaphragmatic breathing and practicing self-pacing with activity.;Stress  Develop more efficient breathing techniques such as purse lipped  breathing and diaphragmatic breathing and practicing self-pacing with activity.;Increase knowledge of respiratory medications and ability to use respiratory devices properly.;Improve shortness of breath with ADL's     Review  -  Is doing well with the exercise sessions, stiil early in program to have met any program goals.  Making great progress, walking on treadmill @ 2.5 mph and 2% grade, and level 4 on the nustep, seems to enjoy coming to the program.     Expected Outcomes  -  See admission goals  See admission goals.        Exercise Goals and Review: Exercise Goals    Row Name 06/14/19 1100 07/13/19 0738 08/10/19 0754         Exercise Goals   Increase Physical Activity  Yes  Yes  Yes     Intervention  Provide advice, education, support and counseling about physical activity/exercise needs.;Develop an individualized exercise prescription for aerobic and resistive training based on initial evaluation findings, risk stratification, comorbidities and participant's personal goals.  Provide advice, education, support and counseling about physical activity/exercise needs.;Develop an individualized exercise prescription for aerobic and resistive training based on initial evaluation findings, risk stratification, comorbidities and participant's personal goals.  Provide advice, education, support and counseling about physical activity/exercise needs.;Develop an individualized exercise prescription for aerobic and resistive training based on initial evaluation findings, risk stratification, comorbidities and participant's personal goals.     Expected Outcomes  Long Term: Exercising regularly at least 3-5 days a week.;Long Term: Add in home exercise to make exercise part of routine and to increase amount of physical activity.;Short Term: Attend rehab on a regular basis to increase amount of physical activity.  Long Term: Exercising regularly at least 3-5 days a week.;Long Term: Add in home exercise to make  exercise part of routine and to increase amount of physical activity.;Short Term: Attend rehab on a regular basis to increase amount of physical activity.  Long Term: Exercising regularly at least 3-5 days a week.;Long Term: Add in home exercise to make exercise part of routine and to increase amount of physical activity.;Short Term: Attend rehab on a regular basis to increase amount of physical activity.     Increase Strength and Stamina  Yes  Yes  Yes     Intervention  Provide advice, education, support and counseling about physical activity/exercise needs.;Develop an individualized exercise prescription for aerobic and resistive training  based on initial evaluation findings, risk stratification, comorbidities and participant's personal goals.  Provide advice, education, support and counseling about physical activity/exercise needs.;Develop an individualized exercise prescription for aerobic and resistive training based on initial evaluation findings, risk stratification, comorbidities and participant's personal goals.  Provide advice, education, support and counseling about physical activity/exercise needs.;Develop an individualized exercise prescription for aerobic and resistive training based on initial evaluation findings, risk stratification, comorbidities and participant's personal goals.     Expected Outcomes  Short Term: Increase workloads from initial exercise prescription for resistance, speed, and METs.;Short Term: Perform resistance training exercises routinely during rehab and add in resistance training at home;Long Term: Improve cardiorespiratory fitness, muscular endurance and strength as measured by increased METs and functional capacity (6MWT)  Short Term: Increase workloads from initial exercise prescription for resistance, speed, and METs.;Short Term: Perform resistance training exercises routinely during rehab and add in resistance training at home;Long Term: Improve cardiorespiratory  fitness, muscular endurance and strength as measured by increased METs and functional capacity (6MWT)  Short Term: Increase workloads from initial exercise prescription for resistance, speed, and METs.;Short Term: Perform resistance training exercises routinely during rehab and add in resistance training at home;Long Term: Improve cardiorespiratory fitness, muscular endurance and strength as measured by increased METs and functional capacity (6MWT)     Able to understand and use rate of perceived exertion (RPE) scale  Yes  Yes  Yes     Intervention  Provide education and explanation on how to use RPE scale  Provide education and explanation on how to use RPE scale  Provide education and explanation on how to use RPE scale     Expected Outcomes  Short Term: Able to use RPE daily in rehab to express subjective intensity level;Long Term:  Able to use RPE to guide intensity level when exercising independently  Short Term: Able to use RPE daily in rehab to express subjective intensity level;Long Term:  Able to use RPE to guide intensity level when exercising independently  Short Term: Able to use RPE daily in rehab to express subjective intensity level;Long Term:  Able to use RPE to guide intensity level when exercising independently     Able to understand and use Dyspnea scale  Yes  Yes  Yes     Intervention  Provide education and explanation on how to use Dyspnea scale  Provide education and explanation on how to use Dyspnea scale  Provide education and explanation on how to use Dyspnea scale     Expected Outcomes  Short Term: Able to use Dyspnea scale daily in rehab to express subjective sense of shortness of breath during exertion;Long Term: Able to use Dyspnea scale to guide intensity level when exercising independently  Short Term: Able to use Dyspnea scale daily in rehab to express subjective sense of shortness of breath during exertion;Long Term: Able to use Dyspnea scale to guide intensity level when  exercising independently  Short Term: Able to use Dyspnea scale daily in rehab to express subjective sense of shortness of breath during exertion;Long Term: Able to use Dyspnea scale to guide intensity level when exercising independently     Knowledge and understanding of Target Heart Rate Range (THRR)  Yes  Yes  Yes     Expected Outcomes  Short Term: Able to state/look up THRR;Short Term: Able to use daily as guideline for intensity in rehab;Long Term: Able to use THRR to govern intensity when exercising independently  Short Term: Able to state/look up THRR;Short Term: Able to  use daily as guideline for intensity in rehab;Long Term: Able to use THRR to govern intensity when exercising independently  Short Term: Able to state/look up THRR;Short Term: Able to use daily as guideline for intensity in rehab;Long Term: Able to use THRR to govern intensity when exercising independently     Understanding of Exercise Prescription  Yes  Yes  Yes     Intervention  Provide education, explanation, and written materials on patient's individual exercise prescription  Provide education, explanation, and written materials on patient's individual exercise prescription  Provide education, explanation, and written materials on patient's individual exercise prescription     Expected Outcomes  Short Term: Able to explain program exercise prescription;Long Term: Able to explain home exercise prescription to exercise independently  Short Term: Able to explain program exercise prescription;Long Term: Able to explain home exercise prescription to exercise independently  Short Term: Able to explain program exercise prescription;Long Term: Able to explain home exercise prescription to exercise independently        Exercise Goals Re-Evaluation: Exercise Goals Re-Evaluation    Row Name 07/13/19 0738 08/10/19 0754           Exercise Goal Re-Evaluation   Exercise Goals Review  Increase Physical Activity;Increase Strength and  Stamina;Able to understand and use rate of perceived exertion (RPE) scale;Able to understand and use Dyspnea scale;Knowledge and understanding of Target Heart Rate Range (THRR);Understanding of Exercise Prescription  Increase Physical Activity;Increase Strength and Stamina;Able to understand and use rate of perceived exertion (RPE) scale;Able to understand and use Dyspnea scale;Knowledge and understanding of Target Heart Rate Range (THRR);Understanding of Exercise Prescription      Comments  Pt has completed 4 exercise sessions. Pt is reluctant for workload increases due to his self doubt. Progress will be slow, but he is slowly gaining confidence. Pt currently exercises at 3.08 METs on the stepper. Will continue to monitor and progress as able.  Pt has completed 10 exercise sessions. Pt has been more accepting of workload increases. Pt currently exercises at 3.6 METs on the treadmill. Will continue to monitor and progress as able.      Expected Outcomes  Through exercise at rehab and at home, the patient will decrease shortness of breath with daily activities and feel confident in carrying out an exercise regime at home.  Through exercise at rehab and at home, the patient will decrease shortness of breath with daily activities and feel confident in carrying out an exercise regime at home.         Nutrition & Weight - Outcomes: Pre Biometrics - 06/14/19 1052      Pre Biometrics   Grip Strength  42 kg        Nutrition:   Nutrition Discharge:   Education Questionnaire Score: Knowledge Questionnaire Score - 06/14/19 1110      Knowledge Questionnaire Score   Pre Score  15/18       Goals reviewed with patient; copy given to patient.

## 2019-09-09 ENCOUNTER — Ambulatory Visit (INDEPENDENT_AMBULATORY_CARE_PROVIDER_SITE_OTHER): Payer: Medicare Other | Admitting: Internal Medicine

## 2019-09-09 ENCOUNTER — Other Ambulatory Visit: Payer: Self-pay

## 2019-09-09 DIAGNOSIS — B029 Zoster without complications: Secondary | ICD-10-CM | POA: Diagnosis not present

## 2019-09-09 MED ORDER — VALACYCLOVIR HCL 1 G PO TABS: 1000.0000 mg | ORAL_TABLET | Freq: Three times a day (TID) | ORAL | 0 refills | Status: DC

## 2019-09-09 NOTE — Progress Notes (Signed)
Subjective:    Patient ID: Robert Kirby, male    DOB: 07-Jun-1951, 67 y.o.   MRN: [id number]  DOS:  09/09/2019 Type of visit - description: Virtual Visit via Video Note  I connected with the above patient  by a video enabled telemedicine application and verified that I am speaking with the correct person using two identifiers.   THIS ENCOUNTER IS A VIRTUAL VISIT DUE TO COVID-19 - PATIENT WAS NOT SEEN IN THE OFFICE. PATIENT HAS CONSENTED TO VIRTUAL VISIT / TELEMEDICINE VISIT   Location of patient: home  Location of provider: office  I discussed the limitations of evaluation and management by telemedicine and the availability of in person appointments. The patient expressed understanding and agreed to proceed.  History of Present Illness: Acute The patient woke up with a rash this morning, it burns, does not itch. His wife is an Therapist, sports and she thinks is shingles. The rash is erythematous with several blisters, they are calling the blisters "pustules". Rash does not past patient's midline      Review of Systems Denies fever chills No cough or difficulty breathing No nausea, vomiting, diarrhea  Past Medical History:  Diagnosis Date  . Addison's disease ([place])   . Antiphospholipid antibody syndrome ([place])   . Arrhythmia    Pacemaker  . Autoimmune encephalomyelitis   . Depression   . GERD (gastroesophageal reflux disease)   . History of blood transfusion    13 units from a GI Bleed.   [facility] Kitchen History of GI bleed   . OSA on CPAP   . Sleep apnea    on CPAP  . 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 CHOLECYSTECTOMY  2000  . COLONOSCOPY  2016   was told to have one in 3 years. From the [facility] [place] [facility] Dr. Amie Critchley  . ESOPHAGOGASTRODUODENOSCOPY     [facility] [place] different doctor least before 2014/15  . NOSE SURGERY     removed polyps from nose around 1996. Said he broke nose in 4 grade and needed surgery on it  .  PACEMAKER PLACEMENT    . TONSILLECTOMY AND ADENOIDECTOMY  1959    Social History   Socioeconomic History  . Marital status: Married    Spouse name: Not on file  . Number of children: 2  . Years of education: Not on file  . Highest education level: Not on file  Occupational History  . Not on file  Tobacco Use  . Smoking status: Never Smoker  . Smokeless tobacco: Never Used  Substance and Sexual Activity  . Alcohol use: Yes    Comment: rare   . Drug use: No  . Sexual activity: Never  Other Topics Concern  . Not on file  Social History Narrative  . Not on file   Social Determinants of Health   Financial Resource Strain:   . Difficulty of Paying Living Expenses: Not on file  Food Insecurity:   . Worried About Charity fundraiser in the Last Year: Not on file  . Ran Out of Food in the Last Year: Not on file  Transportation Needs:   . Lack of Transportation (Medical): Not on file  . Lack of Transportation (Non-Medical): Not on file  Physical Activity:   . Days of Exercise per Week: Not on file  . Minutes of Exercise per Session: Not on file  Stress:   . Feeling of Stress : Not  on file  Social Connections:   . Frequency of Communication with Friends and Family: Not on file  . Frequency of Social Gatherings with Friends and Family: Not on file  . Attends Religious Services: Not on file  . Active Member of Clubs or Organizations: Not on file  . Attends Archivist Meetings: Not on file  . Marital Status: Not on file  Intimate Partner Violence:   . Fear of Current or Ex-Partner: Not on file  . Emotionally Abused: Not on file  . Physically Abused: Not on file  . Sexually Abused: Not on file      Allergies as of 09/09/2019      Reactions   Tetracyclines & Related Nausea Only      Medication List       Accurate as of September 09, 2019  3:36 PM. If you have any questions, ask your nurse or doctor.        amphetamine-dextroamphetamine 10 MG  tablet Commonly known as: ADDERALL Take 10 mg by mouth daily.   busPIRone 30 MG tablet Commonly known as: BUSPAR Take 30 mg by mouth 2 (two) times daily.   carbamazepine 200 MG tablet Commonly known as: TEGRETOL Take 2 tablets in the am and 2 tablets in the pm   CARVEDILOL PO Take 1 tablet by mouth 2 (two) times daily.   clonazePAM 0.5 MG tablet Commonly known as: KLONOPIN Take 0.5 mg by mouth 2 (two) times daily.   escitalopram 20 MG tablet Commonly known as: LEXAPRO Take 20 mg by mouth daily.   fluticasone 50 MCG/ACT nasal spray Commonly known as: FLONASE SHAKE LIQUID AND USE 2 SPRAYS IN EACH NOSTRIL DAILY   furosemide 20 MG tablet Commonly known as: LASIX Take 1 tablet by mouth daily.   hydrocortisone 10 MG tablet Commonly known as: CORTEF TAKE 2 TABLETS IN THE MORNING AND 1 TABLET IN THE EVENING FOR ADDISONS DISEASE   lansoprazole 15 MG capsule Commonly known as: PREVACID TAKE 1 CAPSULE DAILY   losartan 25 MG tablet Commonly known as: COZAAR Take 25 mg by mouth daily.   mirtazapine 45 MG tablet Commonly known as: REMERON Take 90 mg by mouth at bedtime.   riTUXimab 100 MG/10ML injection Commonly known as: RITUXAN Inject into the vein every 6 (six) months.   rosuvastatin 5 MG tablet Commonly known as: CRESTOR Take 5 mg by mouth daily.   tiZANidine 4 MG tablet Commonly known as: ZANAFLEX Take 1 tablet (4 mg total) by mouth every 6 (six) hours as needed for muscle spasms.   valproic acid 250 MG capsule Commonly known as: DEPAKENE Take 3 capsules in the am and 3 capsules in the pm   warfarin 5 MG tablet Commonly known as: COUMADIN 5 mg at bedtime. As directed           Objective:   Physical Exam Skin:        There were no vitals taken for this visit. This is a virtual video visit, the patient is alert oriented x3, in no distress.  I was able to see the rash through the video with the help of his wife.  See graphic    Assessment     20 year old gentleman, PMH includes high cholesterol, HTN, OSA, Addison disease, severe depression, on multiple medications follow-up by psychiatry at [facility], also history of autoimmune encephalomyelitis presents with:  Shingles: Rash started today, I agree with the wife's diagnosis of shingles.  I explained the patient what that  is.  Will start on Valtrex today for 1 week. I anticipate this could get a little worse before it gets better since the symptoms only started few hours ago. However if he gets much worse, he has fever or other problems he needs to be seen in person immediately. I also made him aware that the burning feeling may last a little longer than the rash itself but if the burning is severe he is to let us know.   I discussed the assessment and treatment plan with the patient. The patient was provided an opportunity to ask questions and all were answered. The patient agreed with the plan and demonstrated an understanding of the instructions.   The patient was advised to call back or seek an in-person evaluation if the symptoms worsen or if the condition fails to improve as anticipated.

## 2019-09-10 ENCOUNTER — Telehealth: Payer: Self-pay | Admitting: Family Medicine

## 2019-09-10 ENCOUNTER — Encounter: Payer: Self-pay | Admitting: Family Medicine

## 2019-09-10 MED ORDER — PREDNISONE 20 MG PO TABS: 40.0000 mg | ORAL_TABLET | Freq: Every day | ORAL | 0 refills | Status: DC

## 2019-09-10 MED ORDER — PREDNISONE 20 MG PO TABS: 40.0000 mg | ORAL_TABLET | Freq: Every day | ORAL | 0 refills | Status: AC

## 2019-09-10 NOTE — Telephone Encounter (Signed)
Patient's Wife, Denton Derks,  is calling to state that the pain for shingles is burning and severe. Patient is  taking arthritis strength Tylenol and it is not helping.  Patient can not take Ibuprofen because of coumadin. Patient was seen by Dr. Larose Kells yesterday.  Patient wife is calling to request if something can be called in to the pharmacy for Shingles pain.  Preferred Pharmacy-[facility]   [phone]

## 2019-09-10 NOTE — Telephone Encounter (Signed)
PCP has taken care of

## 2019-09-20 ENCOUNTER — Other Ambulatory Visit: Payer: Self-pay | Admitting: Internal Medicine

## 2019-09-20 ENCOUNTER — Encounter: Payer: Self-pay | Admitting: Family Medicine

## 2019-09-20 MED ORDER — GABAPENTIN 100 MG PO CAPS: 100.0000 mg | ORAL_CAPSULE | Freq: Three times a day (TID) | ORAL | 1 refills | Status: DC

## 2019-09-30 ENCOUNTER — Telehealth: Payer: Self-pay | Admitting: Hematology & Oncology

## 2019-09-30 DIAGNOSIS — Z86718 Personal history of other venous thrombosis and embolism: Secondary | ICD-10-CM | POA: Diagnosis not present

## 2019-09-30 DIAGNOSIS — Z5181 Encounter for therapeutic drug level monitoring: Secondary | ICD-10-CM | POA: Diagnosis not present

## 2019-09-30 DIAGNOSIS — Z7901 Long term (current) use of anticoagulants: Secondary | ICD-10-CM | POA: Diagnosis not present

## 2019-09-30 DIAGNOSIS — D6861 Antiphospholipid syndrome: Secondary | ICD-10-CM | POA: Diagnosis not present

## 2019-09-30 NOTE — Telephone Encounter (Signed)
Called and LMVM for patient regarding moving     Appointment for 1/11 due to CAP in infusion.  I asked that he call back and advise if this was ok,

## 2019-10-04 ENCOUNTER — Inpatient Hospital Stay: Payer: Medicare Other | Attending: Family

## 2019-10-05 DIAGNOSIS — R918 Other nonspecific abnormal finding of lung field: Secondary | ICD-10-CM | POA: Diagnosis not present

## 2019-10-05 DIAGNOSIS — G0481 Other encephalitis and encephalomyelitis: Secondary | ICD-10-CM | POA: Diagnosis not present

## 2019-10-05 DIAGNOSIS — D6861 Antiphospholipid syndrome: Secondary | ICD-10-CM | POA: Diagnosis not present

## 2019-10-05 DIAGNOSIS — J90 Pleural effusion, not elsewhere classified: Secondary | ICD-10-CM | POA: Diagnosis not present

## 2019-10-05 DIAGNOSIS — C37 Malignant neoplasm of thymus: Secondary | ICD-10-CM | POA: Diagnosis not present

## 2019-10-05 DIAGNOSIS — E2740 Unspecified adrenocortical insufficiency: Secondary | ICD-10-CM | POA: Diagnosis not present

## 2019-10-05 DIAGNOSIS — I429 Cardiomyopathy, unspecified: Secondary | ICD-10-CM | POA: Diagnosis not present

## 2019-10-14 DIAGNOSIS — G0481 Other encephalitis and encephalomyelitis: Secondary | ICD-10-CM | POA: Diagnosis not present

## 2019-10-15 ENCOUNTER — Encounter: Payer: Self-pay | Admitting: Family Medicine

## 2019-10-15 ENCOUNTER — Other Ambulatory Visit: Payer: Self-pay

## 2019-10-15 ENCOUNTER — Ambulatory Visit (INDEPENDENT_AMBULATORY_CARE_PROVIDER_SITE_OTHER): Payer: Medicare Other | Admitting: Family Medicine

## 2019-10-15 DIAGNOSIS — J309 Allergic rhinitis, unspecified: Secondary | ICD-10-CM

## 2019-10-15 DIAGNOSIS — F418 Other specified anxiety disorders: Secondary | ICD-10-CM

## 2019-10-15 MED ORDER — LORAZEPAM 1 MG PO TABS: 0.5000 mg | ORAL_TABLET | Freq: Three times a day (TID) | ORAL | 0 refills | Status: AC | PRN

## 2019-10-15 MED ORDER — LEVOCETIRIZINE DIHYDROCHLORIDE 5 MG PO TABS: 5.0000 mg | ORAL_TABLET | Freq: Every evening | ORAL | 2 refills | Status: DC

## 2019-10-15 MED ORDER — PREDNISONE 20 MG PO TABS: 40.0000 mg | ORAL_TABLET | Freq: Every day | ORAL | 0 refills | Status: AC

## 2019-10-15 NOTE — Progress Notes (Signed)
Chief Complaint  Patient presents with  . Nasal Congestion    7 days, covid test negative    Robert Kirby here for URI complaints. Due to COVID-19 pandemic, we are interacting via web portal for an electronic face-to-face visit. I verified patient's ID using 2 identifiers. Patient agreed to proceed with visit via this method. Patient is at home, I am at office. Patient, wife Robert Kirby, and I are present for visit.   Duration: 5 days  Associated symptoms: sinus congestion, sinus pain, rhinorrhea and cough from drainage Denies: itchy watery eyes, ear pain, ear drainage, sore throat, wheezing, shortness of breath, myalgia and fevers Treatment to date: Benadryl (helped) Sick contacts: No  Tested negative for Covid.  Patient has a history of anxiety treated by psychiatry at [facility].  He has had progression of cancer and is traveling to [place] on Monday.  This is caused him quite a bit of anxiety.  He is taking Klonopin amongst other psychiatric medications.  He feels this new situation has caused his anxiety to flare.  He is wondering if there is any other alternatives of the Klonopin.  He has been on hydroxyzine in the past that did not help.  ROS:  Const: Denies fevers HEENT: As noted in HPI Lungs: No SOB  Past Medical History:  Diagnosis Date  . Addison's disease ([place])   . Antiphospholipid antibody syndrome ([place])   . Arrhythmia    Pacemaker  . Autoimmune encephalomyelitis   . Depression   . GERD (gastroesophageal reflux disease)   . History of blood transfusion    13 units from a GI Bleed.   Marland Kitchen History of GI bleed   . OSA on CPAP   . Sleep apnea    on CPAP  . 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  We will hold on the prednisone until Monday.  Let me know in 5 days if no improvement. 2-could increase dose of Klonopin.  If psychiatry team does not get back to them, I have sent in a prescription for Ativan given his situational anxiety. Follow-up as needed. Pt and his wife voiced understanding and agreement to the plan.  [place], DO 10/15/19 12:09 PM

## 2019-10-18 DIAGNOSIS — G0490 Encephalitis and encephalomyelitis, unspecified: Secondary | ICD-10-CM | POA: Diagnosis not present

## 2019-10-18 DIAGNOSIS — D6861 Antiphospholipid syndrome: Secondary | ICD-10-CM | POA: Diagnosis not present

## 2019-10-19 DIAGNOSIS — Z45010 Encounter for checking and testing of cardiac pacemaker pulse generator [battery]: Secondary | ICD-10-CM | POA: Diagnosis not present

## 2019-10-19 DIAGNOSIS — G0490 Encephalitis and encephalomyelitis, unspecified: Secondary | ICD-10-CM | POA: Diagnosis not present

## 2019-10-19 DIAGNOSIS — Z45018 Encounter for adjustment and management of other part of cardiac pacemaker: Secondary | ICD-10-CM | POA: Diagnosis not present

## 2019-10-19 DIAGNOSIS — G3184 Mild cognitive impairment, so stated: Secondary | ICD-10-CM | POA: Diagnosis not present

## 2019-10-19 DIAGNOSIS — Z950 Presence of cardiac pacemaker: Secondary | ICD-10-CM | POA: Diagnosis not present

## 2019-10-19 DIAGNOSIS — G4752 REM sleep behavior disorder: Secondary | ICD-10-CM | POA: Diagnosis not present

## 2019-10-19 DIAGNOSIS — F341 Dysthymic disorder: Secondary | ICD-10-CM | POA: Diagnosis not present

## 2019-10-20 DIAGNOSIS — G0490 Encephalitis and encephalomyelitis, unspecified: Secondary | ICD-10-CM | POA: Diagnosis not present

## 2019-10-20 DIAGNOSIS — F09 Unspecified mental disorder due to known physiological condition: Secondary | ICD-10-CM | POA: Diagnosis not present

## 2019-10-20 DIAGNOSIS — D6861 Antiphospholipid syndrome: Secondary | ICD-10-CM | POA: Diagnosis not present

## 2019-10-21 DIAGNOSIS — G0490 Encephalitis and encephalomyelitis, unspecified: Secondary | ICD-10-CM | POA: Diagnosis not present

## 2019-10-21 DIAGNOSIS — Z45018 Encounter for adjustment and management of other part of cardiac pacemaker: Secondary | ICD-10-CM | POA: Diagnosis not present

## 2019-10-21 DIAGNOSIS — Z45010 Encounter for checking and testing of cardiac pacemaker pulse generator [battery]: Secondary | ICD-10-CM | POA: Diagnosis not present

## 2019-10-21 DIAGNOSIS — Z950 Presence of cardiac pacemaker: Secondary | ICD-10-CM | POA: Diagnosis not present

## 2019-10-25 DIAGNOSIS — G0490 Encephalitis and encephalomyelitis, unspecified: Secondary | ICD-10-CM | POA: Diagnosis not present

## 2019-10-27 ENCOUNTER — Encounter: Payer: Self-pay | Admitting: Family Medicine

## 2019-10-27 ENCOUNTER — Other Ambulatory Visit: Payer: Self-pay | Admitting: Family Medicine

## 2019-10-27 DIAGNOSIS — R6889 Other general symptoms and signs: Secondary | ICD-10-CM

## 2019-10-27 NOTE — Progress Notes (Signed)
am

## 2019-10-28 DIAGNOSIS — G0490 Encephalitis and encephalomyelitis, unspecified: Secondary | ICD-10-CM | POA: Diagnosis not present

## 2019-11-01 ENCOUNTER — Ambulatory Visit: Payer: Medicare Other | Attending: Internal Medicine

## 2019-11-01 DIAGNOSIS — Z23 Encounter for immunization: Secondary | ICD-10-CM | POA: Insufficient documentation

## 2019-11-01 NOTE — Progress Notes (Signed)
   Covid-19 Vaccination Clinic  Name:  Robert Kirby    MRN: [id number] DOB: Jul 02, 1951  11/01/2019  Mr. Robert Kirby was observed post Covid-19 immunization for 15 minutes without incidence. He was provided with Vaccine Information Sheet and instruction to access the V-Safe system.   Mr. Robert Kirby was instructed to call 911 with any severe reactions post vaccine: Marland Kitchen Difficulty breathing  . Swelling of your face and throat  . A fast heartbeat  . A bad rash all over your body  . Dizziness and weakness    Immunizations Administered    Name Date Dose VIS Date Route   Pfizer COVID-19 Vaccine 11/01/2019  3:20 PM 0.3 mL 09/03/2019 Intramuscular   Manufacturer: Ava   Lot: [id number]   [place]: [id number]

## 2019-11-05 DIAGNOSIS — Z7901 Long term (current) use of anticoagulants: Secondary | ICD-10-CM | POA: Diagnosis not present

## 2019-11-05 DIAGNOSIS — Z86718 Personal history of other venous thrombosis and embolism: Secondary | ICD-10-CM | POA: Diagnosis not present

## 2019-11-05 DIAGNOSIS — Z5181 Encounter for therapeutic drug level monitoring: Secondary | ICD-10-CM | POA: Diagnosis not present

## 2019-11-05 DIAGNOSIS — D6861 Antiphospholipid syndrome: Secondary | ICD-10-CM | POA: Diagnosis not present

## 2019-11-08 ENCOUNTER — Encounter: Payer: Self-pay | Admitting: Family Medicine

## 2019-11-10 DIAGNOSIS — G4733 Obstructive sleep apnea (adult) (pediatric): Secondary | ICD-10-CM | POA: Diagnosis not present

## 2019-11-10 DIAGNOSIS — G4752 REM sleep behavior disorder: Secondary | ICD-10-CM | POA: Diagnosis not present

## 2019-11-15 ENCOUNTER — Inpatient Hospital Stay: Payer: Medicare Other | Attending: Family

## 2019-11-15 ENCOUNTER — Other Ambulatory Visit: Payer: Self-pay

## 2019-11-15 VITALS — BP 129/83 | HR 114 | Temp 98.9°F | Resp 17

## 2019-11-15 DIAGNOSIS — C37 Malignant neoplasm of thymus: Secondary | ICD-10-CM | POA: Insufficient documentation

## 2019-11-15 DIAGNOSIS — Z452 Encounter for adjustment and management of vascular access device: Secondary | ICD-10-CM | POA: Insufficient documentation

## 2019-11-15 DIAGNOSIS — Z95828 Presence of other vascular implants and grafts: Secondary | ICD-10-CM

## 2019-11-15 MED ORDER — HEPARIN SOD (PORK) LOCK FLUSH 100 UNIT/ML IV SOLN
500.0000 [IU] | Freq: Once | INTRAVENOUS | Status: DC
  Filled 2019-11-15: qty 5

## 2019-11-15 MED ORDER — SODIUM CHLORIDE 0.9% FLUSH
10.0000 mL | INTRAVENOUS | Status: DC | PRN
  Administered 2019-11-15: 10 mL via INTRAVENOUS
  Filled 2019-11-15: qty 10

## 2019-11-15 MED ORDER — ALTEPLASE 2 MG IJ SOLR
INTRAMUSCULAR | Status: AC
  Filled 2019-11-15: qty 2

## 2019-11-15 MED ORDER — ALTEPLASE 2 MG IJ SOLR
2.0000 mg | Freq: Once | INTRAMUSCULAR | Status: AC | PRN
  Administered 2019-11-15: 10:00:00 2 mg
  Filled 2019-11-15: qty 2

## 2019-11-15 MED ORDER — STERILE WATER FOR INJECTION IJ SOLN
INTRAMUSCULAR | Status: AC
  Filled 2019-11-15: qty 10

## 2019-11-15 NOTE — Patient Instructions (Signed)

## 2019-11-18 DIAGNOSIS — F331 Major depressive disorder, recurrent, moderate: Secondary | ICD-10-CM | POA: Diagnosis not present

## 2019-11-19 ENCOUNTER — Other Ambulatory Visit: Payer: Self-pay | Admitting: Internal Medicine

## 2019-11-26 ENCOUNTER — Telehealth: Payer: Self-pay | Admitting: Family Medicine

## 2019-11-26 ENCOUNTER — Encounter: Payer: Self-pay | Admitting: Family Medicine

## 2019-11-26 ENCOUNTER — Ambulatory Visit: Payer: Medicare Other | Attending: Internal Medicine

## 2019-11-26 DIAGNOSIS — Z23 Encounter for immunization: Secondary | ICD-10-CM | POA: Insufficient documentation

## 2019-11-26 NOTE — Progress Notes (Signed)
   Covid-19 Vaccination Clinic  Name:  MARON STANZIONE    MRN: [id number] DOB: [birth year]  11/26/2019  Mr. Forero was observed post Covid-19 immunization for 15 minutes without incident. He was provided with Vaccine Information Sheet and instruction to access the V-Safe system.   Mr. Radu was instructed to call 911 with any severe reactions post vaccine: Marland Kitchen Difficulty breathing  . Swelling of face and throat  . A fast heartbeat  . A bad rash all over body  . Dizziness and weakness   Immunizations Administered    Name Date Dose VIS Date Route   Pfizer COVID-19 Vaccine 11/26/2019 11:04 AM 0.3 mL 09/03/2019 Intramuscular   Manufacturer: Joiner   Lot: [id number]   [place]: [id number]

## 2019-11-26 NOTE — Telephone Encounter (Signed)
The patients wife requested Shyrl Numbers through her mychart acct) a letter to excuse patient from jury duty starting 12/20/2019 Robert Kirby #[id number]. Letter completed through patients chart/sent through mychart.

## 2019-12-13 DIAGNOSIS — G0490 Encephalitis and encephalomyelitis, unspecified: Secondary | ICD-10-CM | POA: Diagnosis not present

## 2019-12-13 DIAGNOSIS — R5382 Chronic fatigue, unspecified: Secondary | ICD-10-CM | POA: Diagnosis not present

## 2019-12-13 DIAGNOSIS — F064 Anxiety disorder due to known physiological condition: Secondary | ICD-10-CM | POA: Diagnosis not present

## 2019-12-13 DIAGNOSIS — F0630 Mood disorder due to known physiological condition, unspecified: Secondary | ICD-10-CM | POA: Diagnosis not present

## 2019-12-24 ENCOUNTER — Other Ambulatory Visit: Payer: Self-pay

## 2019-12-24 ENCOUNTER — Emergency Department (HOSPITAL_BASED_OUTPATIENT_CLINIC_OR_DEPARTMENT_OTHER)
Admission: EM | Admit: 2019-12-24 | Discharge: 2019-12-24 | Disposition: A | Discharge status: Home / Self Care | Payer: Medicare Other | Attending: Emergency Medicine | Admitting: Emergency Medicine

## 2019-12-24 ENCOUNTER — Encounter (HOSPITAL_BASED_OUTPATIENT_CLINIC_OR_DEPARTMENT_OTHER): Payer: Self-pay | Admitting: *Deleted

## 2019-12-24 DIAGNOSIS — Z7901 Long term (current) use of anticoagulants: Secondary | ICD-10-CM | POA: Diagnosis not present

## 2019-12-24 DIAGNOSIS — H9201 Otalgia, right ear: Secondary | ICD-10-CM | POA: Diagnosis not present

## 2019-12-24 DIAGNOSIS — Z79899 Other long term (current) drug therapy: Secondary | ICD-10-CM | POA: Insufficient documentation

## 2019-12-24 DIAGNOSIS — H9221 Otorrhagia, right ear: Secondary | ICD-10-CM | POA: Diagnosis not present

## 2019-12-24 DIAGNOSIS — Z950 Presence of cardiac pacemaker: Secondary | ICD-10-CM | POA: Insufficient documentation

## 2019-12-24 DIAGNOSIS — Z85238 Personal history of other malignant neoplasm of thymus: Secondary | ICD-10-CM | POA: Diagnosis not present

## 2019-12-24 NOTE — ED Provider Notes (Signed)
[facility] EMERGENCY DEPARTMENT Provider Note   CSN: [id number] Arrival date & time: 12/24/19  1143     History Chief Complaint  Patient presents with  . Otalgia    Robert Kirby is a 69 y.o. male.  The history is provided by the patient.  Otalgia Location:  Right Behind ear:  No abnormality Quality:  Aching Severity:  Mild Onset quality:  Gradual Timing:  Intermittent Progression:  Waxing and waning Chronicity:  New Context comment:  Blood in right ear after using qtip last night on coumadin. No hearing loss Relieved by:  Nothing Worsened by:  Nothing Associated symptoms: ear discharge (blood in ear)   Associated symptoms: no congestion, no fever, no hearing loss, no rhinorrhea, no sore throat and no tinnitus        Past Medical History:  Diagnosis Date  . Addison's disease ([place])   . Antiphospholipid antibody syndrome ([place])   . Arrhythmia    Pacemaker  . Autoimmune encephalomyelitis   . Depression   . GERD (gastroesophageal reflux disease)   . History of blood transfusion    13 units from a GI Bleed.   Marland Kitchen History of GI bleed   . OSA on CPAP   . Sleep apnea    on CPAP  . 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 Left-sided low back pain without sciatica 04/02/2019  . Dyspnea on exertion 04/02/2019  . Right inguinal hernia 03/31/2019  . Seasonal allergic rhinitis due to pollen 12/19/2017  . Depression   . OSA on CPAP   . Autoimmune encephalomyelitis   . Addison's disease ([place])   . Antiphospholipid antibody syndrome ([place])   . Thymic carcinoma [facility])     Past Surgical History:  Procedure Laterality Date  . CHOLECYSTECTOMY  2000  . COLONOSCOPY  2016   was told to have one in 3 years. From the [facility] [place] GI Dr. Amie Critchley  . ESOPHAGOGASTRODUODENOSCOPY     [place] different doctor least before 2014/15  . NOSE SURGERY     removed polyps from nose around 1996. Said he broke  nose in 4 grade and needed surgery on it  . PACEMAKER PLACEMENT    . TONSILLECTOMY AND ADENOIDECTOMY  1959       Family History  Problem Relation Age of Onset  . Depression Mother   . High blood pressure Daughter   . Colon cancer Neg Hx   . Esophageal cancer Neg Hx     Social History   Tobacco Use  . Smoking status: Never Smoker  . Smokeless tobacco: Never Used  Substance Use Topics  . Alcohol use: Yes    Comment: rare   . Drug use: No    Home Medications Prior to Admission medications   Medication Sig Start Date End Date Taking? Authorizing Provider  amphetamine-dextroamphetamine (ADDERALL) 10 MG tablet Take 10 mg by mouth daily. 06/17/19   [provider]  busPIRone (BUSPAR) 30 MG tablet Take 30 mg by mouth 2 (two) times daily.    [provider]  carbamazepine (TEGRETOL) 200 MG tablet Take 2 tablets in the am and 2 tablets in the pm    [provider]  CARVEDILOL PO Take 1 tablet by mouth 2 (two) times daily.    [provider]  clonazePAM (KLONOPIN) 0.5 MG tablet Take 0.5 mg by mouth 2 (two) times daily.    [provider]  DULoxetine (CYMBALTA) 60 MG capsule Take 60 mg by mouth daily.    [provider]  fluticasone (FLONASE) 50 MCG/ACT nasal spray SHAKE LIQUID AND USE 2 SPRAYS IN [facility] NOSTRIL DAILY 05/10/19   Wendling, Crosby Oyster, DO  furosemide (LASIX) 20 MG tablet Take 1 tablet by mouth daily. 02/19/19 02/19/20  [provider]  gabapentin (NEURONTIN) 100 MG capsule TAKE 1 CAPSULE BY MOUTH 3 TIMES DAILY 11/19/19   Colon Branch, MD  lansoprazole (PREVACID) 15 MG capsule TAKE 1 CAPSULE DAILY 02/03/19   Shelda Pal, DO  levocetirizine (XYZAL) 5 MG tablet Take 1 tablet (5 mg total) by mouth every evening. 10/15/19   Wendling, Crosby Oyster, DO  LORazepam (ATIVAN) 1 MG tablet Take 0.5-1 tablets (0.5-1 mg total) by mouth every 8 (eight) hours as needed for anxiety. 10/15/19   Shelda Pal, DO    losartan (COZAAR) 25 MG tablet Take 25 mg by mouth daily.    [provider]  mirtazapine (REMERON) 45 MG tablet Take 90 mg by mouth at bedtime.     [provider]  riTUXimab (RITUXAN) 100 MG/10ML injection Inject into the vein every 6 (six) months.    [provider]  rosuvastatin (CRESTOR) 5 MG tablet Take 5 mg by mouth daily.    [provider]  tiZANidine (ZANAFLEX) 4 MG tablet Take 1 tablet (4 mg total) by mouth every 6 (six) hours as needed for muscle spasms. 04/02/19   Fenton Foy, NP  valACYclovir (VALTREX) 1000 MG tablet Take 1 tablet (1,000 mg total) by mouth 3 (three) times daily. 09/09/19   Colon Branch, MD  valproic acid (DEPAKENE) 250 MG capsule Take 3 capsules in the am and 3 capsules in the pm    [provider]  warfarin (COUMADIN) 5 MG tablet 5 mg at bedtime. As directed     [provider]    Allergies    Tetracyclines & related  Review of Systems   Review of Systems  Constitutional: Negative for fever.  HENT: Positive for ear discharge (blood in ear) and ear pain. Negative for congestion, dental problem, drooling, facial swelling, hearing loss, mouth sores, nosebleeds, postnasal drip, rhinorrhea, sinus pressure, sinus pain, sneezing, sore throat, tinnitus, trouble swallowing and voice change.     Physical Exam Updated Vital Signs  ED Triage Vitals  Enc Vitals Group     BP 12/24/19 1149 121/81     Pulse Rate 12/24/19 1149 (!) 104     Resp 12/24/19 1149 20     Temp 12/24/19 1149 97.6 F (36.4 C)     Temp Source 12/24/19 1149 Oral     SpO2 12/24/19 1149 100 %     Weight 12/24/19 1147 225 lb (102.1 kg)     Height 12/24/19 1147 6' (1.829 m)     Head Circumference --      Peak Flow --      Pain Score 12/24/19 1147 0     Pain Loc --      Pain Edu? --      Excl. in [place]? --     Physical Exam  ED Results / Procedures / Treatments   Labs (all labs ordered are listed, but only abnormal results are  displayed) Labs Reviewed - No data to display  EKG None  Radiology No results found.  Procedures Procedures (including critical care time)  Medications Ordered in ED Medications - No data to display  ED Course  I have  reviewed the triage vital signs and the nursing notes.  Pertinent labs & imaging results that were available during my care of the patient were reviewed by me and considered in my medical decision making (see chart for details).    MDM Rules/Calculators/A&P                       DENE NAZIR is a 69 year old male who presents to the ED with right ear pain.  Patient normal vitals.  No fever.  Patient on Coumadin for antiphospholipid syndrome.  Use a Q-tip to his right ear and has some bleeding.  Has some pain but states no loss of hearing.  He had a cotton ball in his ear overnight and bleeding slightly continued.  On exam he does have some blood within his right ear canal.  Cannot confidently identify the tympanic membrane.  However suspect he either has an abrasion his ear canal or ruptured TM.  Recommend continued use of cotton ball and Vaseline when taking showers and make sure that he keeps the ear dry.  We will have him follow-up with ENT.  There is no active bleeding on exam.  Discharged in good condition.  Given return precautions.  This chart was dictated using voice recognition software.  Despite best efforts to proofread,  errors can occur which can change the documentation meaning.    Final Clinical Impression(s) / ED Diagnoses Final diagnoses:  Right ear pain    Rx / DC Orders ED Discharge Orders    None       Lennice Sites, DO 12/24/19 1202

## 2019-12-24 NOTE — ED Triage Notes (Signed)
He was cleaning out his right ear with a qtip last night and his ear started bleeding.

## 2019-12-24 NOTE — Discharge Instructions (Addendum)
Keep right ear dry with cotton ball and Vaseline while taking showers, please avoid getting any water in your ear until you see ENT.

## 2019-12-27 ENCOUNTER — Inpatient Hospital Stay: Payer: Medicare Other | Attending: Family

## 2019-12-27 ENCOUNTER — Other Ambulatory Visit: Payer: Self-pay

## 2019-12-27 VITALS — BP 110/67 | HR 105 | Resp 18

## 2019-12-27 DIAGNOSIS — Z452 Encounter for adjustment and management of vascular access device: Secondary | ICD-10-CM | POA: Insufficient documentation

## 2019-12-27 DIAGNOSIS — C37 Malignant neoplasm of thymus: Secondary | ICD-10-CM | POA: Diagnosis not present

## 2019-12-27 DIAGNOSIS — Z95828 Presence of other vascular implants and grafts: Secondary | ICD-10-CM

## 2019-12-27 MED ORDER — HEPARIN SOD (PORK) LOCK FLUSH 100 UNIT/ML IV SOLN
500.0000 [IU] | Freq: Once | INTRAVENOUS | Status: DC
  Filled 2019-12-27: qty 5

## 2019-12-27 MED ORDER — SODIUM CHLORIDE 0.9% FLUSH
10.0000 mL | Freq: Once | INTRAVENOUS | Status: DC
  Filled 2019-12-27: qty 10

## 2019-12-27 NOTE — Patient Instructions (Signed)

## 2019-12-28 DIAGNOSIS — H9201 Otalgia, right ear: Secondary | ICD-10-CM | POA: Diagnosis not present

## 2019-12-28 MED ORDER — FLUTICASONE PROPIONATE 50 MCG/ACT NA SUSP: NASAL | 0 refills | Status: DC

## 2020-01-01 ENCOUNTER — Other Ambulatory Visit: Payer: Self-pay | Admitting: Family Medicine

## 2020-01-03 NOTE — Telephone Encounter (Signed)
Please advise 

## 2020-01-06 DIAGNOSIS — R569 Unspecified convulsions: Secondary | ICD-10-CM | POA: Diagnosis not present

## 2020-01-06 DIAGNOSIS — C381 Malignant neoplasm of anterior mediastinum: Secondary | ICD-10-CM | POA: Diagnosis not present

## 2020-01-06 DIAGNOSIS — R791 Abnormal coagulation profile: Secondary | ICD-10-CM | POA: Diagnosis not present

## 2020-01-06 DIAGNOSIS — G0481 Other encephalitis and encephalomyelitis: Secondary | ICD-10-CM | POA: Diagnosis not present

## 2020-01-10 DIAGNOSIS — F331 Major depressive disorder, recurrent, moderate: Secondary | ICD-10-CM | POA: Diagnosis not present

## 2020-01-19 DIAGNOSIS — S01311A Laceration without foreign body of right ear, initial encounter: Secondary | ICD-10-CM | POA: Diagnosis not present

## 2020-01-19 DIAGNOSIS — H9193 Unspecified hearing loss, bilateral: Secondary | ICD-10-CM | POA: Diagnosis not present

## 2020-01-19 DIAGNOSIS — H6123 Impacted cerumen, bilateral: Secondary | ICD-10-CM | POA: Diagnosis not present

## 2020-01-29 ENCOUNTER — Other Ambulatory Visit: Payer: Self-pay | Admitting: Family Medicine

## 2020-01-31 DIAGNOSIS — R569 Unspecified convulsions: Secondary | ICD-10-CM | POA: Diagnosis not present

## 2020-02-01 ENCOUNTER — Other Ambulatory Visit: Payer: Self-pay | Admitting: Family Medicine

## 2020-02-01 ENCOUNTER — Other Ambulatory Visit: Payer: Self-pay

## 2020-02-01 ENCOUNTER — Ambulatory Visit (INDEPENDENT_AMBULATORY_CARE_PROVIDER_SITE_OTHER): Payer: Medicare Other | Admitting: Family Medicine

## 2020-02-01 ENCOUNTER — Encounter: Payer: Self-pay | Admitting: Family Medicine

## 2020-02-01 VITALS — BP 108/72 | HR 98 | Temp 96.2°F | Ht 72.0 in | Wt 230.2 lb

## 2020-02-01 DIAGNOSIS — G4733 Obstructive sleep apnea (adult) (pediatric): Secondary | ICD-10-CM | POA: Diagnosis not present

## 2020-02-01 DIAGNOSIS — R569 Unspecified convulsions: Secondary | ICD-10-CM | POA: Diagnosis not present

## 2020-02-01 DIAGNOSIS — Z9989 Dependence on other enabling machines and devices: Secondary | ICD-10-CM

## 2020-02-01 NOTE — Progress Notes (Signed)
Chief Complaint  Patient presents with  . Follow-up    order a sleep test    Subjective: Patient is a 69 y.o. male here for f/u.  He is here with his wife who helps provide the history.  The patient has a complex medical history and follows many specialist at [facility].  He has a history of OSA on CPAP.  His neurologist wants him to have an updated sleep study due to more recurrent night terrors.  His current sleep specialist in the area had left to take care of the new Covid hospital and he was reportedly not changed to a new provider.  They are requesting a sleep study today.  He is compliant, 13/8 on his settings.  Past Medical History:  Diagnosis Date  . Addison's disease ([place])   . Antiphospholipid antibody syndrome ([place])   . Arrhythmia    Pacemaker  . Autoimmune encephalomyelitis   . Depression   . GERD (gastroesophageal reflux disease)   . History of blood transfusion    13 units from a GI Bleed.   Marland Kitchen History of GI bleed   . OSA on CPAP   . Sleep apnea    on CPAP  . 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 No accessory muscle use Psych: limited judgment and insight, normal affect and mood  Assessment and Plan: OSA on CPAP - Plan: [facility] SLEEP STUDY  We will order a sleep study to see if his CPAP needs to be titrated.  I would like him to see a sleep specialist in the area and will see if the [facility] pulmonology can transfer him to a different provider or if I need to place a new referral.  They would like to stay with the [facility] team. The patient voiced understanding and agreement to the plan.  [place], DO 02/01/20   12:01 PM

## 2020-02-01 NOTE — Patient Instructions (Signed)
If you do not hear anything about your sleep study in the next week, call our office and ask for an update.  I want to get you set up with a different sleep specialist.  Let us know if you need anything.

## 2020-02-02 DIAGNOSIS — R569 Unspecified convulsions: Secondary | ICD-10-CM | POA: Diagnosis not present

## 2020-02-03 ENCOUNTER — Ambulatory Visit (INDEPENDENT_AMBULATORY_CARE_PROVIDER_SITE_OTHER): Payer: Medicare Other | Admitting: Internal Medicine

## 2020-02-03 ENCOUNTER — Encounter: Payer: Self-pay | Admitting: Internal Medicine

## 2020-02-03 ENCOUNTER — Other Ambulatory Visit: Payer: Self-pay

## 2020-02-03 VITALS — BP 110/80 | HR 92 | Temp 98.1°F | Ht 72.0 in | Wt 230.6 lb

## 2020-02-03 DIAGNOSIS — Z9989 Dependence on other enabling machines and devices: Secondary | ICD-10-CM | POA: Diagnosis not present

## 2020-02-03 DIAGNOSIS — G4733 Obstructive sleep apnea (adult) (pediatric): Secondary | ICD-10-CM

## 2020-02-03 DIAGNOSIS — R569 Unspecified convulsions: Secondary | ICD-10-CM

## 2020-02-03 DIAGNOSIS — D6861 Antiphospholipid syndrome: Secondary | ICD-10-CM | POA: Diagnosis not present

## 2020-02-03 DIAGNOSIS — G4752 REM sleep behavior disorder: Secondary | ICD-10-CM | POA: Diagnosis not present

## 2020-02-03 DIAGNOSIS — J301 Allergic rhinitis due to pollen: Secondary | ICD-10-CM | POA: Diagnosis not present

## 2020-02-03 NOTE — Patient Instructions (Signed)
Order- schedule Split night Sleep study with EEG montage   Dx OSA, Seizure disorder, REM Behavior disorder.  We will request CPAP download from [facility]  Please call us about 2 weeks after your sleep study, to see if results and recommendations are ready   Please call as needed

## 2020-02-03 NOTE — Progress Notes (Signed)
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  Previously followed by Dr Lake Bells in 2020- Synopsis: Referred to [facility] in 09/2018 for dyspnea.  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  This was treated with prednisone.  He has followed at [facility] and lives in [place]. 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 Pacemaker for complete heart block Seasonal Allergic Rhinitis, Paralyzed hemidiaphragm.  -----OSA CPAP with [facility]  referred by Dr.Wendling .  Body weight today 230 lbs Epworth score 5 Just had 48 hour EEG at [facility], pending report but not known to have sleep seizures. Continues Tegretol. Original sleep study in [place] with new CPAP about 3 yrs ago. Unknown settings.  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 Some moving of feet. Never directly hit wife, but she now sleeps in separate bedroom due to this. Clonazepam 1 mg and melatonin 15 mg at HS have helped.  Neurologist suggested new sleep study here.  Dyspnea on bending over.  Wakes in AM with "air bubble" in stomach c/w swallowed air on CPAP.  Prior to Admission medications   Medication Sig Start Date End Date Taking? Authorizing Provider  busPIRone (BUSPAR) 30 MG tablet Take 30 mg by mouth 2 (two) times daily.   Yes [provider]  carbamazepine (TEGRETOL) 200 MG tablet Take 2 tablets in the am and 2 tablets in the pm   Yes [provider]  CARVEDILOL PO Take 1 tablet by mouth 2 (two) times daily.   Yes  [provider]  clonazePAM (KLONOPIN) 0.5 MG tablet Take 0.5 mg by mouth 2 (two) times daily.   Yes [provider]  DULoxetine (CYMBALTA) 60 MG capsule Take 60 mg by mouth daily.   Yes [provider]  fluticasone (FLONASE) 50 MCG/ACT nasal spray SHAKE LIQUID AND USE 2 SPRAYS IN [facility] NOSTRIL DAILY 12/28/19  Yes Wendling, Crosby Oyster, DO  furosemide (LASIX) 20 MG tablet Take 1 tablet by mouth daily. 02/19/19 02/19/20 Yes [provider]  hydrocortisone (CORTEF) 10 MG tablet TAKE 2 TABLETS IN THE MORNING AND 1 TABLET IN THE EVENING FOR ADDISONS DISEASE 01/03/20  Yes Shelda Pal, DO  lansoprazole (PREVACID) 15 MG capsule TAKE 1 CAPSULE DAILY 01/31/20  Yes Shelda Pal, DO  levocetirizine (XYZAL) 5 MG tablet Take 1 tablet (5 mg total) by mouth every evening. 10/15/19  Yes Wendling, Crosby Oyster, DO  LORazepam (ATIVAN) 1 MG tablet Take 0.5-1 tablets (0.5-1 mg total) by mouth every 8 (eight) hours as needed for anxiety. 10/15/19  Yes Wendling, Crosby Oyster, DO  losartan (COZAAR) 25 MG tablet Take 25 mg by mouth daily.   Yes [provider]  mirtazapine (REMERON) 45 MG tablet Take 90 mg by mouth at bedtime.    Yes [provider]  riTUXimab (RITUXAN) 100 MG/10ML injection Inject into the vein every 6 (six) months.   Yes [provider]  rosuvastatin (CRESTOR) 5 MG tablet Take 5 mg by mouth daily.   Yes [provider]  warfarin (COUMADIN) 5 MG tablet 5 mg at bedtime.  As directed    Yes [provider]   Past Medical History:  Diagnosis Date  . Addison's disease ([place])   . Antiphospholipid antibody syndrome ([place])   . Arrhythmia    Pacemaker  . Autoimmune encephalomyelitis   . Depression   . GERD (gastroesophageal reflux disease)   . History of blood transfusion    13 units from a GI Bleed.   Marland Kitchen History of GI bleed   . OSA on CPAP   . Sleep apnea    on CPAP  . 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 CHOLECYSTECTOMY  2000  . COLONOSCOPY  2016   was told to have one in 3 years. From the [facility] [place] GI Dr. Amie Critchley  . ESOPHAGOGASTRODUODENOSCOPY     [place] different doctor least before 2014/15  . NOSE SURGERY     removed polyps from nose around 1996. Said he broke nose in 4 grade and needed surgery on it  . PACEMAKER PLACEMENT    . TONSILLECTOMY AND ADENOIDECTOMY  1959   Family History  Problem Relation Age of Onset  . Depression Mother   . High blood pressure Daughter   . Colon cancer Neg Hx   . Esophageal cancer Neg Hx    Social History   Socioeconomic History  . Marital status: Married    Spouse name: Not on file  . Number of children: 2  . Years of education: Not on file  . Highest education level: Not on file  Occupational History  . Not on file  Tobacco Use  . Smoking status: Never Smoker  . Smokeless tobacco: Never Used  Substance and Sexual Activity  . Alcohol use: Yes    Comment: rare   . Drug use: No  . Sexual activity: Never  Other Topics Concern  . Not on file  Social History Narrative  . Not on file   Social Determinants of Health   Financial Resource Strain:   . Difficulty of Paying Living Expenses:   Food Insecurity:   . Worried About Charity fundraiser in the Last Year:   . Arboriculturist in the Last Year:   Transportation Needs:   . Film/video editor (Medical):   Marland Kitchen Lack of Transportation (Non-Medical):   Physical Activity:   . Days of Exercise per Week:   . Minutes of Exercise per Session:   Stress:   . Feeling of Stress :   Social Connections:   . Frequency of Communication with Friends and Family:   . Frequency of Social Gatherings with Friends and Family:   . Attends Religious Services:   . Active Member of Clubs or Organizations:   . Attends Archivist Meetings:   Marland Kitchen Marital Status:   Intimate Partner Violence:   . Fear of Current or  Ex-Partner:   . Emotionally Abused:   Marland Kitchen Physically Abused:   . Sexually Abused:    ROS-see HPI   + = positive Constitutional:   + weight loss, night sweats, fevers, chills, fatigue, lassitude. HEENT:    headaches, difficulty swallowing, tooth/dental problems, sore throat,       sneezing, itching, ear ache, +nasal congestion, post nasal drip, snoring CV:    chest pain, orthopnea, PND, swelling in lower extremities, anasarca,  dizziness, palpitations Resp:   +shortness of breath with exertion or at rest.                productive cough,   non-productive cough, coughing up of blood.              change in color of mucus.  wheezing.   Skin:    rash or lesions. GI:  No-   heartburn, indigestion, abdominal pain, nausea, vomiting, diarrhea,                 change in bowel habits, loss of appetite GU: dysuria, change in color of urine, no urgency or frequency.   flank pain. MS:   +joint pain, stiffness, decreased range of motion, back pain. Neuro-     nothing unusual Psych:  change in mood or affect.  +depression or +anxiety.   memory loss.  OBJ- Physical Exam General- Alert, Oriented, Affect-appropriate, Distress- none acute, + overweight Skin- rash-none, lesions- none, excoriation- none Lymphadenopathy- none Head- atraumatic            Eyes- Gross vision intact, PERRLA, conjunctivae and secretions clear            Ears- Hearing, canals-normal            Nose- Clear, no-Septal dev, mucus, polyps, erosion, perforation             Throat- Mallampati II-III , mucosa clear , drainage- none, tonsils- atrophic, + teeth Neck- flexible , trachea midline, no stridor , thyroid nl, carotid no bruit Chest - symmetrical excursion , unlabored           Heart/CV- RRR , no murmur , no gallop  , no rub, nl s1 s2                           - JVD- none , edema- none, stasis changes- none, varices- none           Lung- clear to P&A, wheeze- none, cough- none , dullness+ R base,  rub- none           Chest wall- + R port, + L pacemaker Abd-  Br/ Gen/ Rectal- Not done, not indicated Extrem- cyanosis- none, clubbing, none, atrophy- none, strength- nl Neuro- grossly intact to observation

## 2020-02-04 DIAGNOSIS — G4752 REM sleep behavior disorder: Secondary | ICD-10-CM | POA: Insufficient documentation

## 2020-02-04 DIAGNOSIS — R569 Unspecified convulsions: Secondary | ICD-10-CM | POA: Insufficient documentation

## 2020-02-04 NOTE — Assessment & Plan Note (Signed)
Followed by [facility], on Tegretol

## 2020-02-04 NOTE — Assessment & Plan Note (Signed)
Maintained on warfarin

## 2020-02-04 NOTE — Assessment & Plan Note (Signed)
Variable nasal congestion and post-nasal drip Plan- flonase, antihistamines

## 2020-02-04 NOTE — Assessment & Plan Note (Addendum)
Original Dx of OSA in [place] is not available. CPAP titration here in 2020. Subsequent dx RBD at [facility]. History of seizure and [facility] has done 48 hr EEG but suggested repeat Sleep Study. This will need to be done as attended in-center study due to seizure history. If possible, will try to adjust pressure range for less leak and less aerophagia. Plan- Split night sleep study, Requesting current CPAP download data from [employer]

## 2020-02-04 NOTE — Assessment & Plan Note (Signed)
Fairly well controlled with clonazepam 1 mg at bedtime.

## 2020-02-07 ENCOUNTER — Inpatient Hospital Stay: Payer: Medicare Other | Attending: Family

## 2020-02-07 ENCOUNTER — Other Ambulatory Visit: Payer: Self-pay

## 2020-02-07 VITALS — BP 128/89 | HR 94 | Temp 98.7°F | Resp 18

## 2020-02-07 DIAGNOSIS — C37 Malignant neoplasm of thymus: Secondary | ICD-10-CM | POA: Diagnosis not present

## 2020-02-07 DIAGNOSIS — Z452 Encounter for adjustment and management of vascular access device: Secondary | ICD-10-CM | POA: Insufficient documentation

## 2020-02-07 DIAGNOSIS — Z95828 Presence of other vascular implants and grafts: Secondary | ICD-10-CM

## 2020-02-07 MED ORDER — SODIUM CHLORIDE 0.9% FLUSH
10.0000 mL | INTRAVENOUS | Status: DC | PRN
  Administered 2020-02-07: 10 mL via INTRAVENOUS
  Filled 2020-02-07: qty 10

## 2020-02-07 MED ORDER — HEPARIN SOD (PORK) LOCK FLUSH 100 UNIT/ML IV SOLN
500.0000 [IU] | Freq: Once | INTRAVENOUS | Status: AC
  Administered 2020-02-07: 500 [IU] via INTRAVENOUS
  Filled 2020-02-07: qty 5

## 2020-02-10 DIAGNOSIS — F331 Major depressive disorder, recurrent, moderate: Secondary | ICD-10-CM | POA: Diagnosis not present

## 2020-02-10 DIAGNOSIS — F419 Anxiety disorder, unspecified: Secondary | ICD-10-CM | POA: Diagnosis not present

## 2020-02-10 DIAGNOSIS — G4752 REM sleep behavior disorder: Secondary | ICD-10-CM | POA: Diagnosis not present

## 2020-02-22 DIAGNOSIS — H903 Sensorineural hearing loss, bilateral: Secondary | ICD-10-CM | POA: Diagnosis not present

## 2020-02-22 DIAGNOSIS — H9313 Tinnitus, bilateral: Secondary | ICD-10-CM | POA: Diagnosis not present

## 2020-02-22 DIAGNOSIS — H9193 Unspecified hearing loss, bilateral: Secondary | ICD-10-CM | POA: Diagnosis not present

## 2020-02-22 DIAGNOSIS — S01311A Laceration without foreign body of right ear, initial encounter: Secondary | ICD-10-CM | POA: Diagnosis not present

## 2020-02-22 DIAGNOSIS — H9311 Tinnitus, right ear: Secondary | ICD-10-CM | POA: Diagnosis not present

## 2020-02-23 ENCOUNTER — Telehealth: Payer: Self-pay | Admitting: Internal Medicine

## 2020-02-23 DIAGNOSIS — Z7901 Long term (current) use of anticoagulants: Secondary | ICD-10-CM | POA: Diagnosis not present

## 2020-02-23 NOTE — Telephone Encounter (Signed)
Dr. Annamaria Boots, please advise on the message from [facility] at [place].

## 2020-02-24 ENCOUNTER — Other Ambulatory Visit (HOSPITAL_COMMUNITY)
Admission: RE | Admit: 2020-02-24 | Discharge: 2020-02-24 | Disposition: A | Discharge status: Home / Self Care | Payer: Medicare Other | Source: Ambulatory Visit | Attending: Internal Medicine | Admitting: Internal Medicine

## 2020-02-24 DIAGNOSIS — Z01812 Encounter for preprocedural laboratory examination: Secondary | ICD-10-CM | POA: Insufficient documentation

## 2020-02-24 DIAGNOSIS — Z20822 Contact with and (suspected) exposure to covid-19: Secondary | ICD-10-CM | POA: Diagnosis not present

## 2020-02-24 LAB — SARS CORONAVIRUS 2 (TAT 6-24 HRS): SARS Coronavirus 2: NEGATIVE

## 2020-02-24 NOTE — Telephone Encounter (Signed)
Spoke with [employer]. He is aware of CY's response and verbalized understanding.   Nothing further needed at time of call.

## 2020-02-24 NOTE — Telephone Encounter (Signed)
Yes please- both

## 2020-02-26 ENCOUNTER — Other Ambulatory Visit: Payer: Self-pay

## 2020-02-26 ENCOUNTER — Ambulatory Visit (HOSPITAL_BASED_OUTPATIENT_CLINIC_OR_DEPARTMENT_OTHER): Payer: Medicare Other | Attending: Internal Medicine | Admitting: Internal Medicine

## 2020-02-26 VITALS — Ht 72.0 in | Wt 225.0 lb

## 2020-02-26 DIAGNOSIS — G4733 Obstructive sleep apnea (adult) (pediatric): Secondary | ICD-10-CM | POA: Diagnosis not present

## 2020-03-04 DIAGNOSIS — G4733 Obstructive sleep apnea (adult) (pediatric): Secondary | ICD-10-CM | POA: Diagnosis not present

## 2020-03-05 NOTE — Procedures (Signed)
Patient Name: Robert Kirby, Robert Kirby Date: 02/26/2020 Gender: Male D.O.B: 05/19/1951 Age (years): 73 Referring Provider: Baird Lyons MD, ABSM Height (inches): 72 Interpreting Physician: Baird Lyons MD, ABSM Weight (lbs): 240 RPSGT: Lanae Boast BMI: 33 MRN: [id number] Neck Size: 19.75  CLINICAL INFORMATION Sleep Study Type: Split Night CPAP Indication for sleep study: OSA, Snoring Epworth Sleepiness Score: 10  SLEEP STUDY TECHNIQUE As per the AASM Manual for the Scoring of Sleep and Associated Events v2.3 (April 2016) with a hypopnea requiring 4% desaturations.  The channels recorded and monitored were frontal, central and occipital EEG, electrooculogram (EOG), submentalis EMG (chin), nasal and oral airflow, thoracic and abdominal wall motion, anterior tibialis EMG, snore microphone, electrocardiogram, and pulse oximetry. Continuous positive airway pressure (CPAP) was initiated when the patient met split night criteria and was titrated according to treat sleep-disordered breathing Seizure and REM Behavior montages included.  MEDICATIONS Medications self-administered by patient taken the night of the study : MELATONIN  RESPIRATORY PARAMETERS Diagnostic  Total AHI (/hr): 35.1 RDI (/hr): 35.9 OA Index (/hr): 2.1 CA Index (/hr): 0.0 REM AHI (/hr): 40.0 NREM AHI (/hr): 34.9 Supine AHI (/hr): 35.1 Non-supine AHI (/hr): 0 Min O2 Sat (%): 79.00 Mean O2 (%): 91.31 Time below 88% (min): 19.2   Titration  Optimal Pressure (cm):  AHI at Optimal Pressure (/hr): N/A Min O2 at Optimal Pressure (%): 83.00 Supine % at Optimal (%): N/A Sleep % at Optimal (%): N/A   SLEEP ARCHITECTURE The recording time for the entire night was 404 minutes.  During a baseline period of 191.5 minutes, the patient slept for 143.8 minutes in REM and nonREM, yielding a sleep efficiency of 75.1%. Sleep onset after lights out was 8.3 minutes with a REM latency of 147.0 minutes. The patient spent  30.95% of the night in stage N1 sleep, 65.92% in stage N2 sleep, 0.00% in stage N3 and 3.1% in REM.  During the titration period of 209.1 minutes, the patient slept for 112.5 minutes in REM and nonREM, yielding a sleep efficiency of 53.8%. Sleep onset after CPAP initiation was 0.3 minutes with a REM latency of N/A minutes. The patient spent 26.22% of the night in stage N1 sleep, 73.78% in stage N2 sleep, 0.00% in stage N3 and 0% in REM.  CARDIAC DATA The 2 lead EKG demonstrated sinus rhythm. The mean heart rate was 85.09 beats per minute. Other EKG findings include: None.  LEG MOVEMENT DATA The total Periodic Limb Movements of Sleep (PLMS) were 0. The PLMS index was 0.00 .  IMPRESSIONS - Sleep was extremely fragmented by very frequent breif arousals and awakenings throughout the night. - No seizure acvtivity or REM Behavior Disorder was recorded, but time in REM was very brief, 4.5 minutes. - Severe obstructive sleep apnea occurred during the diagnostic portion of the study (AHI = 35.1/hour). An optimal PAP pressure was selected 10 cwp. - No significant central sleep apnea occurred during the diagnostic portion of the study (CAI = 0.0/hour). - Moderate oxygen desaturation was noted during the diagnostic portion of the study (Min O2 =79.00%). Mean sat on CPAP 10 was 93.2%. - The patient snored with moderate snoring volume during the diagnostic portion of the study. - No cardiac abnormalities were noted during this study. - Clinically significant periodic limb movements did not occur during sleep.  DIAGNOSIS - Obstructive Sleep Apnea (327.23 [G47.33 ICD-10])  RECOMMENDATIONS - Suggest trial of CPAP 10 cwp or autopap 5-15. Addition of a sleep aid should be considered. - The  patient wore a medium ResMed AirFit F20 full face mask with heated humidifier - Sleep hygiene should be reviewed to assess factors that may improve sleep quality. - Weight management and regular exercise should be  initiated or continued.  [Electronically signed] 03/05/2020 10:28 AM  Baird Lyons MD, ABSM Diplomate, American Board of Sleep Medicine   NPI: [id number]                         [place] [place] [facility]  ELECTRONICALLY SIGNED ON:  03/05/2020, 10:15 AM [place] PH: [phone]   FX: [phone] [place]

## 2020-03-20 ENCOUNTER — Inpatient Hospital Stay: Payer: Medicare Other | Attending: Family

## 2020-03-20 ENCOUNTER — Inpatient Hospital Stay (HOSPITAL_BASED_OUTPATIENT_CLINIC_OR_DEPARTMENT_OTHER): Payer: Medicare Other | Admitting: Hematology & Oncology

## 2020-03-20 VITALS — BP 117/86 | HR 102 | Temp 97.1°F | Resp 17

## 2020-03-20 VITALS — HR 99 | Wt 224.0 lb

## 2020-03-20 DIAGNOSIS — G9349 Other encephalopathy: Secondary | ICD-10-CM | POA: Insufficient documentation

## 2020-03-20 DIAGNOSIS — Z452 Encounter for adjustment and management of vascular access device: Secondary | ICD-10-CM | POA: Diagnosis present

## 2020-03-20 DIAGNOSIS — C37 Malignant neoplasm of thymus: Secondary | ICD-10-CM

## 2020-03-20 DIAGNOSIS — G0481 Other encephalitis and encephalomyelitis: Secondary | ICD-10-CM | POA: Diagnosis not present

## 2020-03-20 DIAGNOSIS — Z7901 Long term (current) use of anticoagulants: Secondary | ICD-10-CM | POA: Insufficient documentation

## 2020-03-20 DIAGNOSIS — Z95828 Presence of other vascular implants and grafts: Secondary | ICD-10-CM

## 2020-03-20 DIAGNOSIS — Z79899 Other long term (current) drug therapy: Secondary | ICD-10-CM | POA: Insufficient documentation

## 2020-03-20 MED ORDER — SODIUM CHLORIDE 0.9% FLUSH
10.0000 mL | Freq: Once | INTRAVENOUS | Status: AC
  Administered 2020-03-20: 10 mL via INTRAVENOUS
  Filled 2020-03-20: qty 10

## 2020-03-20 MED ORDER — HEPARIN SOD (PORK) LOCK FLUSH 100 UNIT/ML IV SOLN
500.0000 [IU] | Freq: Once | INTRAVENOUS | Status: AC
  Administered 2020-03-20: 500 [IU] via INTRAVENOUS
  Filled 2020-03-20: qty 5

## 2020-03-20 NOTE — Patient Instructions (Signed)

## 2020-03-20 NOTE — Progress Notes (Signed)
Hematology and Oncology Follow Up Visit  Robert Kirby [id number] [birth year] 69 y.o. 03/20/2020   Principle Diagnosis:   Metastatic Thymic Cancer ([id number])  Auto-immune limbic encephalopathy  Anti-cardiolipin Ab Syndrome  Current Therapy:    Rituxan -- given at [facility] -- q 6 months  Coumadin to maintain INR 2.5-3     Interim History:  Robert Kirby is back for follow-up. This is a second office visit. We saw him a couple weeks ago. He has been getting his Port-A-Cath flush out at [facility]. However this is a long way for him to go. He subsequently is being seen at the [facility] for his a port flushes.  He still goes to [facility] for his Rituxan every 6 months.  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 Again I think his family doctor or at [facility] by monitors this.  He really has had no problems since we just saw him 2 weeks ago. He is a retired Company secretary. He is still quite active. It is amazing that he still does not have any issue with respect to the thymic cancer. He had a chemotherapy with carboplatinum and Taxol followed by radiation therapy. All this was completed in August 2015.  He has had no cough or shortness of breath. He says he does get short of breath when he bends over. Robert Kirby had no change in bowel or bladder habits. He has had no issues with his memory. He has had no problems with nausea or vomiting. There is no leg swelling. He has had no rashes. He has had no weakness.  Overall, I would say his performance status is ECOG 0.  Medications:  Current Outpatient Medications:  .  clonazePAM (KLONOPIN) 1 MG tablet, Take by mouth., Disp: , Rfl:  .  LORazepam (ATIVAN) 1 MG tablet, Take by mouth., Disp: , Rfl:  .  mirtazapine (REMERON) 45 MG tablet, Take by mouth., Disp: , Rfl:  .  warfarin (COUMADIN) 5 MG tablet, Take 1/2 tablet daily except 1  tablet  On Tuesday and Saturday  or  As directed,  Disp: , Rfl:  .  busPIRone (BUSPAR) 30 MG tablet, Take 30 mg by mouth 2 (two) times daily., Disp: , Rfl:  .  carbamazepine (TEGRETOL) 200 MG tablet, Take 2 tablets in the am and 2 tablets in the pm, Disp: , Rfl:  .  carvedilol (COREG) 3.125 MG tablet, Take 3.125 mg by mouth 2 (two) times daily., Disp: , Rfl:  .  CARVEDILOL PO, Take 1 tablet by mouth 2 (two) times daily., Disp: , Rfl:  .  clonazePAM (KLONOPIN) 0.5 MG tablet, Take 0.5 mg by mouth 2 (two) times daily., Disp: , Rfl:  .  DULoxetine (CYMBALTA) 60 MG capsule, Take 60 mg by mouth daily., Disp: , Rfl:  .  fluticasone (FLONASE) 50 MCG/ACT nasal spray, SHAKE LIQUID AND USE 2 SPRAYS IN EACH NOSTRIL DAILY, Disp: 48 g, Rfl: 0 .  furosemide (LASIX) 20 MG tablet, Take 1 tablet by mouth daily., Disp: , Rfl:  .  hydrocortisone (CORTEF) 10 MG tablet, TAKE 2 TABLETS IN THE MORNING AND 1 TABLET IN THE EVENING FOR ADDISONS DISEASE, Disp: 270 tablet, Rfl: 3 .  lansoprazole (PREVACID) 15 MG capsule, TAKE 1 CAPSULE DAILY, Disp: 90 capsule, Rfl: 3 .  levocetirizine (XYZAL) 5 MG tablet, Take 1 tablet (5 mg total) by mouth every evening., Disp: 30 tablet, Rfl: 2 .  LORazepam (ATIVAN) 1 MG tablet, Take  0.5-1 tablets (0.5-1 mg total) by mouth every 8 (eight) hours as needed for anxiety., Disp: 30 tablet, Rfl: 0 .  losartan (COZAAR) 25 MG tablet, Take 25 mg by mouth daily., Disp: , Rfl:  .  mirtazapine (REMERON) 45 MG tablet, Take 90 mg by mouth at bedtime. , Disp: , Rfl:  .  riTUXimab (RITUXAN) 100 MG/10ML injection, Inject into the vein every 6 (six) months., Disp: , Rfl:  .  rosuvastatin (CRESTOR) 5 MG tablet, Take 5 mg by mouth daily., Disp: , Rfl:  .  warfarin (COUMADIN) 5 MG tablet, 5 mg at bedtime. As directed , Disp: , Rfl:   Allergies:  Allergies  Allergen Reactions  . Other Other (See Comments), Nausea And Vomiting and Nausea Only  . Tetracyclines & Related Nausea Only    Past Medical History, Surgical history, Social history, and Family  History were reviewed and updated.  Review of Systems: Review of Systems  Constitutional: Negative.   HENT:  Negative.   Eyes: Negative.   Respiratory: Negative.   Cardiovascular: Negative.   Gastrointestinal: Negative.   Endocrine: Negative.   Genitourinary: Negative.    Musculoskeletal: Negative.   Skin: Negative.   Neurological: Negative.   Hematological: Negative.   Psychiatric/Behavioral: Negative.     Physical Exam:  weight is 224 lb (101.6 kg). His pulse is 99.   Wt Readings from Last 3 Encounters:  03/20/20 224 lb (101.6 kg)  02/26/20 225 lb (102.1 kg)  02/03/20 230 lb 9.6 oz (104.6 kg)    Physical Exam Vitals reviewed.  HENT:     Head: Normocephalic and atraumatic.  Eyes:     Pupils: Pupils are equal, round, and reactive to light.  Cardiovascular:     Rate and Rhythm: Normal rate and regular rhythm.     Heart sounds: Normal heart sounds.  Pulmonary:     Effort: Pulmonary effort is normal.     Breath sounds: Normal breath sounds.  Abdominal:     General: Bowel sounds are normal.     Palpations: Abdomen is soft.  Musculoskeletal:        General: No tenderness or deformity. Normal range of motion.     Cervical back: Normal range of motion.  Lymphadenopathy:     Cervical: No cervical adenopathy.  Skin:    General: Skin is warm and dry.     Findings: No erythema or rash.  Neurological:     Mental Status: He is alert and oriented to person, place, and time.  Psychiatric:        Behavior: Behavior normal.        Thought Content: Thought content normal.        Judgment: Judgment normal.    No results found for: WBC, HGB, HCT, MCV, PLT   Chemistry   No results found for: NA, K, CL, CO2, BUN, CREATININE, GLU No results found for: CALCIUM, ALKPHOS, AST, ALT, BILITOT    Impression and Plan: Robert Kirby is a very nice 69 year old male. He certainly looks a lot younger. This is amazing since he has been through so much.  I see no problems from a  hematologic or oncologic perspective. Looks like the limbic encephalopathy is under good control. It sounds like he will be on Rituxan for life.  We will go ahead and flush his Port-A-Cath every 6 weeks.  We will see him back in 3 months.   Volanda Napoleon, MD 6/28/20213:33 PM

## 2020-03-21 NOTE — Progress Notes (Signed)
I do not need this routed to me.

## 2020-03-24 ENCOUNTER — Telehealth: Payer: Self-pay | Admitting: Internal Medicine

## 2020-03-24 DIAGNOSIS — G4733 Obstructive sleep apnea (adult) (pediatric): Secondary | ICD-10-CM

## 2020-03-24 NOTE — Telephone Encounter (Signed)
There has not been an order placed for cpap settings change.  Will route to triage to see if order needs to be placed.

## 2020-03-24 NOTE — Telephone Encounter (Signed)
Order for cpap settings change has been placed. Called and spoke with pt's wife [name] letting her know this was done and she verbalized understanding. Nothing further needed.

## 2020-04-07 ENCOUNTER — Other Ambulatory Visit: Payer: Self-pay | Admitting: Family Medicine

## 2020-04-07 DIAGNOSIS — J309 Allergic rhinitis, unspecified: Secondary | ICD-10-CM

## 2020-04-07 MED ORDER — LEVOCETIRIZINE DIHYDROCHLORIDE 5 MG PO TABS: 5.0000 mg | ORAL_TABLET | Freq: Every evening | ORAL | 2 refills | Status: DC

## 2020-04-07 MED ORDER — FLUTICASONE PROPIONATE 50 MCG/ACT NA SUSP: NASAL | 2 refills | Status: DC

## 2020-04-10 ENCOUNTER — Other Ambulatory Visit: Payer: Self-pay

## 2020-04-10 DIAGNOSIS — Z7901 Long term (current) use of anticoagulants: Secondary | ICD-10-CM | POA: Diagnosis not present

## 2020-04-10 DIAGNOSIS — R222 Localized swelling, mass and lump, trunk: Secondary | ICD-10-CM | POA: Diagnosis not present

## 2020-04-10 DIAGNOSIS — R918 Other nonspecific abnormal finding of lung field: Secondary | ICD-10-CM | POA: Diagnosis not present

## 2020-04-10 DIAGNOSIS — I447 Left bundle-branch block, unspecified: Secondary | ICD-10-CM | POA: Diagnosis not present

## 2020-04-10 DIAGNOSIS — Z9221 Personal history of antineoplastic chemotherapy: Secondary | ICD-10-CM | POA: Diagnosis not present

## 2020-04-10 DIAGNOSIS — C37 Malignant neoplasm of thymus: Secondary | ICD-10-CM | POA: Diagnosis not present

## 2020-04-10 DIAGNOSIS — Z7952 Long term (current) use of systemic steroids: Secondary | ICD-10-CM | POA: Diagnosis not present

## 2020-04-10 DIAGNOSIS — D6861 Antiphospholipid syndrome: Secondary | ICD-10-CM | POA: Diagnosis not present

## 2020-04-10 DIAGNOSIS — Z923 Personal history of irradiation: Secondary | ICD-10-CM | POA: Diagnosis not present

## 2020-04-10 DIAGNOSIS — F332 Major depressive disorder, recurrent severe without psychotic features: Secondary | ICD-10-CM | POA: Diagnosis not present

## 2020-04-10 DIAGNOSIS — I442 Atrioventricular block, complete: Secondary | ICD-10-CM | POA: Diagnosis not present

## 2020-04-10 DIAGNOSIS — G0481 Other encephalitis and encephalomyelitis: Secondary | ICD-10-CM | POA: Diagnosis not present

## 2020-04-10 DIAGNOSIS — G4733 Obstructive sleep apnea (adult) (pediatric): Secondary | ICD-10-CM

## 2020-04-10 DIAGNOSIS — Z950 Presence of cardiac pacemaker: Secondary | ICD-10-CM | POA: Diagnosis not present

## 2020-04-10 DIAGNOSIS — I429 Cardiomyopathy, unspecified: Secondary | ICD-10-CM | POA: Diagnosis not present

## 2020-04-10 DIAGNOSIS — E2740 Unspecified adrenocortical insufficiency: Secondary | ICD-10-CM | POA: Diagnosis not present

## 2020-04-13 DIAGNOSIS — Z79899 Other long term (current) drug therapy: Secondary | ICD-10-CM | POA: Diagnosis not present

## 2020-04-13 DIAGNOSIS — G0481 Other encephalitis and encephalomyelitis: Secondary | ICD-10-CM | POA: Diagnosis not present

## 2020-04-13 DIAGNOSIS — Z7901 Long term (current) use of anticoagulants: Secondary | ICD-10-CM | POA: Diagnosis not present

## 2020-04-20 DIAGNOSIS — Z45018 Encounter for adjustment and management of other part of cardiac pacemaker: Secondary | ICD-10-CM | POA: Diagnosis not present

## 2020-04-25 DIAGNOSIS — F331 Major depressive disorder, recurrent, moderate: Secondary | ICD-10-CM | POA: Diagnosis not present

## 2020-04-25 DIAGNOSIS — G478 Other sleep disorders: Secondary | ICD-10-CM | POA: Diagnosis not present

## 2020-04-25 DIAGNOSIS — F419 Anxiety disorder, unspecified: Secondary | ICD-10-CM | POA: Diagnosis not present

## 2020-05-01 ENCOUNTER — Inpatient Hospital Stay: Payer: Medicare Other | Attending: Family

## 2020-05-01 ENCOUNTER — Other Ambulatory Visit: Payer: Self-pay

## 2020-05-01 VITALS — BP 130/85 | HR 86 | Temp 98.8°F | Resp 16

## 2020-05-01 DIAGNOSIS — Z452 Encounter for adjustment and management of vascular access device: Secondary | ICD-10-CM | POA: Insufficient documentation

## 2020-05-01 DIAGNOSIS — C37 Malignant neoplasm of thymus: Secondary | ICD-10-CM | POA: Diagnosis not present

## 2020-05-01 MED ORDER — SODIUM CHLORIDE 0.9% FLUSH
10.0000 mL | INTRAVENOUS | Status: DC | PRN
  Administered 2020-05-01: 10 mL via INTRAVENOUS
  Filled 2020-05-01: qty 10

## 2020-05-01 MED ORDER — HEPARIN SOD (PORK) LOCK FLUSH 100 UNIT/ML IV SOLN
500.0000 [IU] | Freq: Once | INTRAVENOUS | Status: AC
  Administered 2020-05-01: 500 [IU] via INTRAVENOUS
  Filled 2020-05-01: qty 5

## 2020-05-01 NOTE — Patient Instructions (Signed)

## 2020-05-05 ENCOUNTER — Ambulatory Visit: Payer: Medicare Other | Admitting: Internal Medicine

## 2020-05-05 NOTE — Progress Notes (Deleted)
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  Previously followed by Dr Lake Bells in 2020- Synopsis: Referred to [facility] in 09/2018 for dyspnea.  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  This was treated with prednisone.  He has followed at [facility] and lives in [place]. 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 Pacemaker for complete heart block Seasonal Allergic Rhinitis, Paralyzed hemidiaphragm.  -----OSA CPAP with [employer]  referred by Dr.Wendling .  Body weight today 230 lbs Epworth score 5 Just had 48 hour EEG at [facility], pending report but not known to have sleep seizures. Continues Tegretol. Original sleep study in [place] with new CPAP about 3 yrs ago. Unknown settings.  REM Behavior Disorder dx'd at [place] - mainly between 3:30 and 5:30 AM. Some moving of feet. Never directly hit wife, but she now sleeps in separate bedroom due to this. Clonazepam 1 mg and melatonin 15 mg at HS have helped.  Neurologist suggested new sleep study here.  Dyspnea on bending over.  Wakes in AM with "air bubble" in stomach c/w swallowed air on CPAP.  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   ROS-see HPI   + =  positive Constitutional:   + weight loss, night sweats, fevers, chills, fatigue, lassitude. HEENT:    headaches, difficulty swallowing, tooth/dental problems, sore throat,       sneezing, itching, ear ache, +nasal congestion, post nasal drip, snoring CV:    chest pain, orthopnea, PND, swelling in lower extremities, anasarca,                                  dizziness, palpitations Resp:   +shortness of breath with exertion or at rest.                productive cough,   non-productive cough, coughing up of blood.              change in color of mucus.  wheezing.   Skin:    rash or lesions. GI:  No-   heartburn, indigestion, abdominal pain, nausea, vomiting, diarrhea,                 change in bowel habits, loss of appetite GU: dysuria, change in color of urine, no urgency or frequency.   flank pain. MS:   +joint pain, stiffness, decreased range of motion, back pain. Neuro-     nothing unusual Psych:  change in mood or affect.  +depression or +anxiety.   memory loss.  OBJ- Physical Exam General- Alert, Oriented, Affect-appropriate, Distress- none acute, + overweight Skin- rash-none, lesions- none, excoriation- none Lymphadenopathy- none Head- atraumatic            Eyes- Gross vision intact, PERRLA, conjunctivae and secretions clear  Ears- Hearing, canals-normal            Nose- Clear, no-Septal dev, mucus, polyps, erosion, perforation             Throat- Mallampati II-III , mucosa clear , drainage- none, tonsils- atrophic, + teeth Neck- flexible , trachea midline, no stridor , thyroid nl, carotid no bruit Chest - symmetrical excursion , unlabored           Heart/CV- RRR , no murmur , no gallop  , no rub, nl s1 s2                           - JVD- none , edema- none, stasis changes- none, varices- none           Lung- clear to P&A, wheeze- none, cough- none , dullness+ R base, rub- none           Chest wall- + R port, + L pacemaker Abd-  Br/ Gen/ Rectal- Not done, not  indicated Extrem- cyanosis- none, clubbing, none, atrophy- none, strength- nl Neuro- grossly intact to observation

## 2020-05-18 NOTE — Telephone Encounter (Signed)
Dr. Annamaria Boots, see MyChart message from patient. Last OV 02/03/2020 Please advise.  I have a follow up appointment after I had a sleep study in June. I would like to have an appointment ASAP.  I have a REM disorder and I have been having nightmares and have fallen out of bed several times in the past 2 weeks. This usually happens between 4 am and 6 am. Please let me know. Thank you,  Robert Kirby

## 2020-05-18 NOTE — Telephone Encounter (Signed)
1) He has an appointment with me in November. Ok to move that up as able, suing an Therapist, sports held slot.  2) Start treatment with melatonin, which is otc. Start with 3 mg at bedtime. Every 2 nights increase by 3 mg, up to a maximum of 18 mg/ night

## 2020-05-18 NOTE — Telephone Encounter (Signed)
Patient contacted, new appointment made, medication instructions provided per Dr. Janee Morn notes. Patient verbalized understanding.

## 2020-05-22 ENCOUNTER — Telehealth: Payer: Self-pay | Admitting: Family Medicine

## 2020-05-22 ENCOUNTER — Ambulatory Visit (INDEPENDENT_AMBULATORY_CARE_PROVIDER_SITE_OTHER): Payer: Medicare Other | Admitting: Family Medicine

## 2020-05-22 ENCOUNTER — Encounter: Payer: Self-pay | Admitting: Family Medicine

## 2020-05-22 ENCOUNTER — Other Ambulatory Visit: Payer: Self-pay

## 2020-05-22 VITALS — BP 118/80 | HR 104 | Temp 98.5°F | Ht 72.0 in | Wt 220.2 lb

## 2020-05-22 DIAGNOSIS — H6982 Other specified disorders of Eustachian tube, left ear: Secondary | ICD-10-CM | POA: Diagnosis not present

## 2020-05-22 MED ORDER — PREDNISONE 20 MG PO TABS: 40.0000 mg | ORAL_TABLET | Freq: Every day | ORAL | 0 refills | Status: DC

## 2020-05-22 MED ORDER — FLUTICASONE PROPIONATE 50 MCG/ACT NA SUSP: NASAL | 2 refills | Status: DC

## 2020-05-22 NOTE — Patient Instructions (Signed)
Continue the Flonase and Xyzal.   If you aren't better in the next 2 weeks, let me know.  Let us know if you need anything.

## 2020-05-22 NOTE — Telephone Encounter (Signed)
Left ear is clogged and has on going allergy concerns. Would like an in person visit if possible. Wanted to know if he could be seen today.

## 2020-05-22 NOTE — Progress Notes (Signed)
Chief Complaint  Patient presents with  . Ear Fullness    Subjective: Patient is a 69 y.o. male here for L ear fullness.  Last night he heard a popping noise and his ear closed off. No pain, fevers, drainage. He has had running nose/drainage from his sinuses over the past couple weeks. Compliant w Xyzal and Flonase. Does not use Q tips.   Past Medical History:  Diagnosis Date  . Addison's disease ([place])   . Antiphospholipid antibody syndrome ([place])   . Arrhythmia    Pacemaker  . Autoimmune encephalomyelitis   . Depression   . GERD (gastroesophageal reflux disease)   . History of blood transfusion    13 units from a GI Bleed.   Marland Kitchen History of GI bleed   . OSA on CPAP   . Sleep apnea    on CPAP  . 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[place]) 2015    Objective: BP 118/80 (BP Location: Left Arm, Patient Position: Sitting, Cuff Size: Normal)   Pulse (!) 104   Temp 98.5 F (36.9 C) (Oral)   Ht 6' (1.829 m)   Wt 220 lb 4 oz (99.9 kg)   SpO2 95%   BMI 29.87 kg/m  General: Awake, appears stated age HEENT: MMM, EOMi, nares patent w clear rhinorrhea; ears patent, TM's neg b/l Lungs: No accessory muscle use Psych: Age appropriate judgment and insight, normal affect and mood  Assessment and Plan: Dysfunction of left eustachian tube - Plan: predniSONE (DELTASONE) 20 MG tablet  Pred burst for ETD. Cont INCS, Xyzal.  F/u prn.  The patient voiced understanding and agreement to the plan.  [place], DO 05/22/20  2:29 PM

## 2020-05-22 NOTE — Telephone Encounter (Signed)
Scheduled and patient aware

## 2020-05-22 NOTE — Telephone Encounter (Signed)
2 o'clock should be fine. Ty.

## 2020-05-25 ENCOUNTER — Encounter: Payer: Self-pay | Admitting: Internal Medicine

## 2020-05-25 ENCOUNTER — Other Ambulatory Visit: Payer: Self-pay

## 2020-05-25 ENCOUNTER — Ambulatory Visit (INDEPENDENT_AMBULATORY_CARE_PROVIDER_SITE_OTHER): Payer: Medicare Other | Admitting: Internal Medicine

## 2020-05-25 VITALS — BP 108/74 | HR 104 | Ht 72.0 in | Wt 221.0 lb

## 2020-05-25 DIAGNOSIS — G4752 REM sleep behavior disorder: Secondary | ICD-10-CM

## 2020-05-25 DIAGNOSIS — Z9989 Dependence on other enabling machines and devices: Secondary | ICD-10-CM

## 2020-05-25 DIAGNOSIS — G4733 Obstructive sleep apnea (adult) (pediatric): Secondary | ICD-10-CM | POA: Diagnosis not present

## 2020-05-25 DIAGNOSIS — Z23 Encounter for immunization: Secondary | ICD-10-CM | POA: Diagnosis not present

## 2020-05-25 NOTE — Patient Instructions (Signed)
Suggest trying otc Chlortrimeton at bedtime as a more sedating antihistamine to see if it helps sleep as well as drainage  Try otc nasal spray Nasalcrom/ cromolyn/ cromol  To see if it helps drying  We are contacting [employer] for a download so we can advise setting changes if appropriate.   Order- refer to [place] for [place]  Please call if we can help

## 2020-05-25 NOTE — Progress Notes (Signed)
M married, Company secretary, never smoker. Previously followed by Dr Lake Bells in 2020- Synopsis: Referred to [facility] in 09/2018 fordyspnea.  He has a complex medical history which includes obstructive sleep apnea on CPAP, a diagnosis  of thymic carcinoma treated with chemo therapy and radiation in 2015.  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  This was treated with prednisone.  He has followed at [facility] and lives in [place]. 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 Pacemaker for complete heart block Seasonal Allergic Rhinitis, Paralyzed hemidiaphragm. Original sleep study in [place] with new CPAP about 2018. Unknown settings.  REM Behavior Disorder dx'd at [facility] 02/26/20- AHI 35.1/ hr, desat to 79%, CPAP rec auto 5-15, body weight 240 lbs ----------------------------------------------------------------------------------------------------------------------------------  -----OSA CPAP with [employer]  referred by Dr.Wendling .  Body weight today 230 lbs Epworth score 5 Just had 48 hour EEG at [facility], pending report but not known to have sleep seizures. Continues Tegretol. Original sleep study in [place] with new CPAP about 3 yrs ago. Unknown settings.  REM Behavior Disorder dx'd at [place] - mainly between 3:30 and 5:30 AM. Some moving of feet. Never directly hit wife, but she now sleeps in separate bedroom due to this. Clonazepam 1 mg and melatonin 15 mg at HS have helped.  Neurologist suggested new sleep study here.  Dyspnea on bending over.  Wakes in AM with "air bubble" in stomach c/w swallowed air on CPAP.  05/25/20- 69 yoMnever smoker followed for OSA, RBD, complicated by  thymic carcinoma treated with chemo therapy and radiation in  2015., Autoimmune Encephalitis with Seizure treated with plasmapheresis and rituximab.  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  This was treated with prednisone.  He has followed at [facility] [facility] and lives in [place]. 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 Pacemaker for complete heart block Seasonal Allergic Rhinitis, Paralyzed hemidiaphragm Split PSG 02/26/20- AHI 35.1/ hr, desat to 79%, CPAP rec auto 5-15, body weight 240 lbs -----Pt states that he does use the cpap but is not using it every night. Pt states at times he will feel like he has a bubbling feeling in stomach. Body weight today 221 lbs O2 2l [employer] Download-  CPAP auto 5-15 was changed to 10 as of 7/19. He c/o "bubbling in stomach despite simethicone.Large FFM is always leaking. We can reduce pressure for trial. Bothersome postnasal drip- discussed chlortrimeton/ nasalcrom. Separate room from wife, adjustable bed, but elevating HOB is uncomfortable.  Using clonazepam 1 mg and mirtazapine for sleep, now trying melatonin 13 mg per [facility] for RBD. Memory" not great" since encephalitis. O2 isn't bled into CPAP, so he is using either O2 or CPAP. Uses O2 in day, but feels he doesn't need it usually for sleep. 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    ROS-see HPI   + = positive Constitutional:   + weight loss, night sweats, fevers, chills, fatigue, lassitude. HEENT:    headaches, difficulty swallowing, tooth/dental problems, sore throat,       sneezing, itching, ear ache, +nasal congestion, post nasal drip, snoring CV:    chest pain, orthopnea, PND, swelling in lower extremities, anasarca,  dizziness, palpitations Resp:   +shortness of breath with exertion or at rest.                productive cough,   non-productive cough, coughing up of  blood.              change in color of mucus.  wheezing.   Skin:    rash or lesions. GI:  No-   heartburn, indigestion, abdominal pain, nausea, vomiting, diarrhea,                 change in bowel habits, loss of appetite GU: dysuria, change in color of urine, no urgency or frequency.   flank pain. MS:   +joint pain, stiffness, decreased range of motion, back pain. Neuro-     nothing unusual Psych:  change in mood or affect.  +depression or +anxiety.   +memory loss.  OBJ- Physical Exam General- Alert, Oriented, Affect-appropriate, Distress- none acute, + overweight Skin- rash-none, lesions- none, excoriation- none Lymphadenopathy- none Head- atraumatic            Eyes- Gross vision intact, PERRLA, conjunctivae and secretions clear            Ears- +Hearing aid            Nose- Clear, no-Septal dev, mucus, polyps, erosion, perforation             Throat- Mallampati II-III , mucosa clear , drainage- none, tonsils- atrophic, + teeth Neck- flexible , trachea midline, no stridor , thyroid nl, carotid no bruit Chest - symmetrical excursion , unlabored           Heart/CV- RRR , no murmur , no gallop  , no rub, nl s1 s2                           - JVD- none , edema- none, stasis changes- none, varices- none           Lung- clear to P&A, wheeze- none, cough- none , dullness+ R base, rub- none           Chest wall- + R port, + L pacemaker Abd-  Br/ Gen/ Rectal- Not done, not indicated Extrem- cyanosis- none, clubbing, none, atrophy- none, strength- nl Neuro- grossly intact to observation

## 2020-06-02 DIAGNOSIS — I420 Dilated cardiomyopathy: Secondary | ICD-10-CM | POA: Diagnosis not present

## 2020-06-02 DIAGNOSIS — E7800 Pure hypercholesterolemia, unspecified: Secondary | ICD-10-CM | POA: Diagnosis not present

## 2020-06-02 DIAGNOSIS — D6861 Antiphospholipid syndrome: Secondary | ICD-10-CM | POA: Diagnosis not present

## 2020-06-02 DIAGNOSIS — I447 Left bundle-branch block, unspecified: Secondary | ICD-10-CM | POA: Diagnosis not present

## 2020-06-02 DIAGNOSIS — R0600 Dyspnea, unspecified: Secondary | ICD-10-CM | POA: Diagnosis not present

## 2020-06-07 ENCOUNTER — Other Ambulatory Visit (HOSPITAL_BASED_OUTPATIENT_CLINIC_OR_DEPARTMENT_OTHER): Payer: Medicare Other | Admitting: Internal Medicine

## 2020-06-07 DIAGNOSIS — Z7901 Long term (current) use of anticoagulants: Secondary | ICD-10-CM | POA: Diagnosis not present

## 2020-06-08 DIAGNOSIS — Z7901 Long term (current) use of anticoagulants: Secondary | ICD-10-CM | POA: Diagnosis not present

## 2020-06-11 NOTE — Assessment & Plan Note (Signed)
Seems to have fairly good control with clonazepam 1 mg and melatonin. 13 mg. Wife in separate room less able to witness.

## 2020-06-11 NOTE — Assessment & Plan Note (Addendum)
Seems to benefit from CPAP, but unclear how much he is using it. Plan - get download for verification of settings, compliance, mask fitting/ desensitization

## 2020-06-12 ENCOUNTER — Other Ambulatory Visit: Payer: Self-pay

## 2020-06-12 ENCOUNTER — Inpatient Hospital Stay: Payer: Medicare Other | Attending: Family

## 2020-06-12 ENCOUNTER — Inpatient Hospital Stay (HOSPITAL_BASED_OUTPATIENT_CLINIC_OR_DEPARTMENT_OTHER): Payer: Medicare Other | Admitting: Hematology & Oncology

## 2020-06-12 ENCOUNTER — Telehealth: Payer: Self-pay | Admitting: Hematology & Oncology

## 2020-06-12 VITALS — BP 132/57 | HR 110 | Temp 97.9°F | Resp 19

## 2020-06-12 VITALS — Wt 217.2 lb

## 2020-06-12 DIAGNOSIS — Z79899 Other long term (current) drug therapy: Secondary | ICD-10-CM | POA: Insufficient documentation

## 2020-06-12 DIAGNOSIS — Z923 Personal history of irradiation: Secondary | ICD-10-CM | POA: Diagnosis not present

## 2020-06-12 DIAGNOSIS — Z7901 Long term (current) use of anticoagulants: Secondary | ICD-10-CM | POA: Diagnosis not present

## 2020-06-12 DIAGNOSIS — G0481 Other encephalitis and encephalomyelitis: Secondary | ICD-10-CM | POA: Diagnosis not present

## 2020-06-12 DIAGNOSIS — C37 Malignant neoplasm of thymus: Secondary | ICD-10-CM | POA: Insufficient documentation

## 2020-06-12 DIAGNOSIS — Z452 Encounter for adjustment and management of vascular access device: Secondary | ICD-10-CM | POA: Insufficient documentation

## 2020-06-12 DIAGNOSIS — Z95828 Presence of other vascular implants and grafts: Secondary | ICD-10-CM

## 2020-06-12 MED ORDER — SODIUM CHLORIDE 0.9% FLUSH
10.0000 mL | Freq: Once | INTRAVENOUS | Status: AC
  Administered 2020-06-12: 10 mL via INTRAVENOUS
  Filled 2020-06-12: qty 10

## 2020-06-12 MED ORDER — HEPARIN SOD (PORK) LOCK FLUSH 100 UNIT/ML IV SOLN
500.0000 [IU] | Freq: Once | INTRAVENOUS | Status: AC
  Administered 2020-06-12: 500 [IU] via INTRAVENOUS
  Filled 2020-06-12: qty 5

## 2020-06-12 NOTE — Telephone Encounter (Signed)
Appointments scheduled patient has My Chart Access per 9/20 los

## 2020-06-12 NOTE — Progress Notes (Signed)
Hematology and Oncology Follow Up Visit  Robert Kirby [id number] [birth year] 69 y.o. 06/12/2020   Principle Diagnosis:   Metastatic Thymic Cancer ([id number])  Auto-immune limbic encephalopathy  Anti-cardiolipin Ab Syndrome  Current Therapy:    Rituxan -- given at [facility] -- q 6 months  Coumadin to maintain INR 2.5-3     Interim History:  Robert Kirby is back for follow-up.  So far, everything is going quite well for him.  He really has had no issues at all.  He does the Rituxan every 6 months.  He has completed the chemotherapy and radiation therapy for the thymic cancer.  He is being watched closely.  Apparently his last scan did not show any obvious disease although there were 2 very small areas that are being monitored.  I think he goes out to [place] next month for another scan.  He has had no problems with cough.  He did say that part of his diaphragm I think on the right side was damaged by the radiation.  He says when he bends over, he does get a little bit of shortness of breath.  He has had no bleeding.  He is on Coumadin for the antiphospholipid antibody syndrome.  He checks his INR at home.  He has had a last INR was 2.7.  He has had no problems with fever.  He has been very cautious with the coronavirus.  There is no rashes.  He has had no headache.  He has had no swallowing difficulties.  Overall, his performance status is ECOG 0.    Medications:  Current Outpatient Medications:  .  busPIRone (BUSPAR) 30 MG tablet, Take 30 mg by mouth 2 (two) times daily., Disp: , Rfl:  .  carbamazepine (TEGRETOL XR) 200 MG 12 hr tablet, Take by mouth., Disp: , Rfl:  .  carbamazepine (TEGRETOL) 200 MG tablet, Take 2 tablets in the am and 2 tablets in the pm, Disp: , Rfl:  .  carvedilol (COREG) 3.125 MG tablet, Take 3.125 mg by mouth 2 (two) times daily., Disp: , Rfl:  .  clonazePAM (KLONOPIN) 0.5 MG tablet, Take 0.5 mg by mouth 2 (two) times daily., Disp: , Rfl:  .  DULoxetine (CYMBALTA)  60 MG capsule, Take 120 mg by mouth daily. , Disp: , Rfl:  .  fluticasone (FLONASE) 50 MCG/ACT nasal spray, SHAKE LIQUID AND USE 2 SPRAYS IN EACH NOSTRIL DAILY, Disp: 48 g, Rfl: 2 .  furosemide (LASIX) 20 MG tablet, Take 1 tablet by mouth daily., Disp: , Rfl:  .  hydrocortisone (CORTEF) 10 MG tablet, TAKE 2 TABLETS IN THE MORNING AND 1 TABLET IN THE EVENING FOR ADDISONS DISEASE, Disp: 270 tablet, Rfl: 3 .  lansoprazole (PREVACID) 15 MG capsule, TAKE 1 CAPSULE DAILY, Disp: 90 capsule, Rfl: 3 .  levocetirizine (XYZAL) 5 MG tablet, Take 1 tablet (5 mg total) by mouth every evening., Disp: 90 tablet, Rfl: 2 .  LORazepam (ATIVAN) 1 MG tablet, Take 0.5-1 tablets (0.5-1 mg total) by mouth every 8 (eight) hours as needed for anxiety., Disp: 30 tablet, Rfl: 0 .  losartan (COZAAR) 25 MG tablet, Take 25 mg by mouth daily., Disp: , Rfl:  .  mirtazapine (REMERON) 45 MG tablet, Take by mouth., Disp: , Rfl:  .  riTUXimab (RITUXAN) 100 MG/10ML injection, Inject into the vein every 6 (six) months., Disp: , Rfl:  .  rosuvastatin (CRESTOR) 10 MG tablet, Take 10 mg by mouth at bedtime., Disp: , Rfl:  .  rosuvastatin (  CRESTOR) 5 MG tablet, Take 5 mg by mouth daily., Disp: , Rfl:  .  warfarin (COUMADIN) 5 MG tablet, 5 mg at bedtime. As directed , Disp: , Rfl:  .  warfarin (COUMADIN) 5 MG tablet, Take 1/2 tablet daily except 1  tablet  On Tuesday and Saturday  or  As directed, Disp: , Rfl:   Allergies:  Allergies  Allergen Reactions  . Other Other (See Comments), Nausea And Vomiting and Nausea Only  . Tetracyclines & Related Nausea Only    Past Medical History, Surgical history, Social history, and Family History were reviewed and updated.  Review of Systems: Review of Systems  Constitutional: Negative.   HENT:  Negative.   Eyes: Negative.   Respiratory: Negative.   Cardiovascular: Negative.   Gastrointestinal: Negative.   Endocrine: Negative.   Genitourinary: Negative.    Musculoskeletal: Negative.    Skin: Negative.   Neurological: Negative.   Hematological: Negative.   Psychiatric/Behavioral: Negative.     Physical Exam:  weight is 217 lb 4 oz (98.5 kg).   Wt Readings from Last 3 Encounters:  06/12/20 217 lb 4 oz (98.5 kg)  05/25/20 221 lb (100.2 kg)  05/22/20 220 lb 4 oz (99.9 kg)    Physical Exam Vitals reviewed.  HENT:     Head: Normocephalic and atraumatic.  Eyes:     Pupils: Pupils are equal, round, and reactive to light.  Cardiovascular:     Rate and Rhythm: Normal rate and regular rhythm.     Heart sounds: Normal heart sounds.  Pulmonary:     Effort: Pulmonary effort is normal.     Breath sounds: Normal breath sounds.  Abdominal:     General: Bowel sounds are normal.     Palpations: Abdomen is soft.  Musculoskeletal:        General: No tenderness or deformity. Normal range of motion.     Cervical back: Normal range of motion.  Lymphadenopathy:     Cervical: No cervical adenopathy.  Skin:    General: Skin is warm and dry.     Findings: No erythema or rash.  Neurological:     Mental Status: He is alert and oriented to person, place, and time.  Psychiatric:        Behavior: Behavior normal.        Thought Content: Thought content normal.        Judgment: Judgment normal.    No results found for: WBC, HGB, HCT, MCV, PLT   Chemistry   No results found for: NA, K, CL, CO2, BUN, CREATININE, GLU No results found for: CALCIUM, ALKPHOS, AST, ALT, BILITOT    Impression and Plan: Robert Kirby is a very nice 69 year old male.  I am very impressed with how well he is done.  He really has been through quite a bit lot.  He is almost like a living "textbook."  We will still flush his Port-A-Cath every 2 months.  I told him that we can always do Rituxan here if going out to [place] becomes an issue.  Given that it only goes out there every 6 months is not too inconvenient for him.  I think we can now get him back after the Thanksgiving and Christmas holidays.    Volanda Napoleon, MD 9/20/202112:48 PM

## 2020-06-12 NOTE — Patient Instructions (Signed)

## 2020-06-16 DIAGNOSIS — F331 Major depressive disorder, recurrent, moderate: Secondary | ICD-10-CM | POA: Diagnosis not present

## 2020-06-21 ENCOUNTER — Telehealth (INDEPENDENT_AMBULATORY_CARE_PROVIDER_SITE_OTHER): Payer: Medicare Other | Admitting: Family Medicine

## 2020-06-21 ENCOUNTER — Encounter: Payer: Self-pay | Admitting: Family Medicine

## 2020-06-21 ENCOUNTER — Other Ambulatory Visit: Payer: Self-pay

## 2020-06-21 VITALS — Ht 72.0 in

## 2020-06-21 DIAGNOSIS — J0140 Acute pansinusitis, unspecified: Secondary | ICD-10-CM

## 2020-06-21 DIAGNOSIS — R059 Cough, unspecified: Secondary | ICD-10-CM

## 2020-06-21 DIAGNOSIS — R05 Cough: Secondary | ICD-10-CM

## 2020-06-21 DIAGNOSIS — Z20828 Contact with and (suspected) exposure to other viral communicable diseases: Secondary | ICD-10-CM | POA: Diagnosis not present

## 2020-06-21 MED ORDER — PROMETHAZINE-DM 6.25-15 MG/5ML PO SYRP: 5.0000 mL | ORAL_SOLUTION | Freq: Four times a day (QID) | ORAL | 0 refills | Status: DC | PRN

## 2020-06-21 MED ORDER — AMOXICILLIN-POT CLAVULANATE 875-125 MG PO TABS: 1.0000 | ORAL_TABLET | Freq: Two times a day (BID) | ORAL | 0 refills | Status: DC

## 2020-06-21 NOTE — Progress Notes (Signed)
Virtual Visit via Video Note  I connected with Robert Kirby on 06/21/20 at  9:20 AM EDT by a video enabled telemedicine application and verified that I am speaking with the correct person using two identifiers.  Location/ persons in visit  Patient: home alone  Provider: home    I discussed the limitations of evaluation and management by telemedicine and the availability of in person appointments. The patient expressed understanding and agreed to proceed.  History of Present Illness: Pt is home c/o congestion and sinus pressure and dry cough.  Yesterday he was fatigued.   No fevers, + body aches/ joint pains     Past Medical History:  Diagnosis Date  . Addison's disease ([place])   . Antiphospholipid antibody syndrome ([place])   . Arrhythmia    Pacemaker  . Autoimmune encephalomyelitis   . Depression   . GERD (gastroesophageal reflux disease)   . History of blood transfusion    13 units from a GI Bleed.   Marland Kitchen History of GI bleed   . OSA on CPAP   . Sleep apnea    on CPAP  . 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[place]) 2015   Outpatient Encounter Medications as of 06/21/2020  Medication Sig  . busPIRone (BUSPAR) 30 MG tablet Take 30 mg by mouth 2 (two) times daily.  . carbamazepine (TEGRETOL XR) 200 MG 12 hr tablet Take by mouth.  . carvedilol (COREG) 3.125 MG tablet Take 3.125 mg by mouth 2 (two) times daily.  . clonazePAM (KLONOPIN) 0.5 MG tablet Take 0.5 mg by mouth 2 (two) times daily.  . DULoxetine (CYMBALTA) 60 MG capsule Take 120 mg by mouth daily.   . fluticasone (FLONASE) 50 MCG/ACT nasal spray SHAKE LIQUID AND USE 2 SPRAYS IN EACH NOSTRIL DAILY  . hydrocortisone (CORTEF) 10 MG tablet TAKE 2 TABLETS IN THE MORNING AND 1 TABLET IN THE EVENING FOR ADDISONS DISEASE  . lansoprazole (PREVACID) 15 MG capsule TAKE 1 CAPSULE DAILY  . levocetirizine (XYZAL) 5 MG tablet Take 1 tablet (5 mg total) by mouth every evening.  Marland Kitchen LORazepam (ATIVAN) 1 MG tablet Take 0.5-1 tablets (0.5-1 mg total) by mouth every 8  (eight) hours as needed for anxiety.  Marland Kitchen losartan (COZAAR) 25 MG tablet Take 25 mg by mouth daily.  . riTUXimab (RITUXAN) 100 MG/10ML injection Inject into the vein every 6 (six) months.  . rosuvastatin (CRESTOR) 10 MG tablet Take 10 mg by mouth at bedtime.  . rosuvastatin (CRESTOR) 5 MG tablet Take 5 mg by mouth daily.  Marland Kitchen warfarin (COUMADIN) 5 MG tablet Take 1/2 tablet daily except 1  tablet  On Tuesday and Saturday  or  As directed  . amoxicillin-clavulanate (AUGMENTIN) 875-125 MG tablet Take 1 tablet by mouth 2 (two) times daily.  . carbamazepine (TEGRETOL) 200 MG tablet Take 2 tablets in the am and 2 tablets in the pm  . furosemide (LASIX) 20 MG tablet Take 1 tablet by mouth daily.  . mirtazapine (REMERON) 45 MG tablet Take by mouth.  . promethazine-dextromethorphan (PROMETHAZINE-DM) 6.25-15 MG/5ML syrup Take 5 mLs by mouth 4 (four) times daily as needed.  . warfarin (COUMADIN) 5 MG tablet 5 mg at bedtime. As directed    No facility-administered encounter medications on file as of 06/21/2020.   Allergies  Allergen Reactions  . Other Other (See Comments), Nausea And Vomiting and Nausea Only  . Tetracyclines & Related Nausea Only    Observations/Objective: There were no vitals filed for this visit. Pulse ox this am 93% without  ox ---- 98% with o2----- it normally runs about 93-94%    Pt has O2 on now Pt is in NAD  Assessment and Plan: 1. Acute non-recurrent pansinusitis con't nasal spray--flonase abx per orders  Pt will get covid test ---- he did get his covid booster 2 weeks ago  - amoxicillin-clavulanate (AUGMENTIN) 875-125 MG tablet; Take 1 tablet by mouth 2 (two) times daily.  Dispense: 20 tablet; Refill: 0  2. Cough  - promethazine-dextromethorphan (PROMETHAZINE-DM) 6.25-15 MG/5ML syrup; Take 5 mLs by mouth 4 (four) times daily as needed.  Dispense: 118 mL; Refill: 0  Follow Up Instructions:    I discussed the assessment and treatment plan with the patient. The patient  was provided an opportunity to ask questions and all were answered. The patient agreed with the plan and demonstrated an understanding of the instructions.   The patient was advised to call back or seek an in-person evaluation if the symptoms worsen or if the condition fails to improve as anticipated.  I provided 25 minutes of non-face-to-face time during this encounter.   Ann Held, DO

## 2020-06-22 ENCOUNTER — Telehealth: Payer: Self-pay | Admitting: Family Medicine

## 2020-06-22 ENCOUNTER — Other Ambulatory Visit: Payer: Self-pay | Admitting: Nurse Practitioner

## 2020-06-22 ENCOUNTER — Encounter: Payer: Self-pay | Admitting: Family Medicine

## 2020-06-22 DIAGNOSIS — U071 COVID-19: Secondary | ICD-10-CM

## 2020-06-22 NOTE — Telephone Encounter (Signed)
Agree with infusion clinic  Quarantine 10 days but if pulse ox stays below 95% consider ER --- may need covid clinic as well

## 2020-06-22 NOTE — Telephone Encounter (Signed)
See mychart messages

## 2020-06-22 NOTE — Progress Notes (Signed)
I connected by phone with Robert Kirby on 06/22/2020 at 3:55 PM to discuss the potential use of an new treatment for mild to moderate COVID-19 viral infection in non-hospitalized patients.  This patient is a 69 y.o. male that meets the FDA criteria for Emergency Use Authorization of casirivimab\imdevimab.  Has a (+) direct SARS-CoV-2 viral test result  Has mild or moderate COVID-19   Is ? 69 years of age and weighs ? 40 kg  Is NOT hospitalized due to COVID-19  Is NOT requiring oxygen therapy or requiring an increase in baseline oxygen flow rate due to COVID-19  Is within 10 days of symptom onset  Has at least one of the high risk factor(s) for progression to severe COVID-19 and/or hospitalization as defined in EUA.  Specific high risk criteria : Older age (>/= 69 yo) and Immunosuppressive Disease or Treatment   I have spoken and communicated the following to the patient or parent/caregiver:  1. FDA has authorized the emergency use of casirivimab\imdevimab for the treatment of mild to moderate COVID-19 in adults and pediatric patients with positive results of direct SARS-CoV-2 viral testing who are 57 years of age and older weighing at least 40 kg, and who are at high risk for progressing to severe COVID-19 and/or hospitalization.  2. The significant known and potential risks and benefits of casirivimab\imdevimab, and the extent to which such potential risks and benefits are unknown.  3. Information on available alternative treatments and the risks and benefits of those alternatives, including clinical trials.  4. Patients treated with casirivimab\imdevimab should continue to self-isolate and use infection control measures (e.g., wear mask, isolate, social distance, avoid sharing personal items, clean and disinfect high touch surfaces, and frequent handwashing) according to CDC guidelines.   5. The patient or parent/caregiver has the option to accept or refuse casirivimab\imdevimab  .  After reviewing this information with the patient, the patient has agreed to receive one of the available covid 19 monoclonal antibodies and will be provided an appropriate fact sheet prior to infusion.Beckey Rutter, [place], AGNP-C [phone] ([place])

## 2020-06-22 NOTE — Telephone Encounter (Signed)
Patient's spouse called states patient has tested positive for covid 19.  Please Advise

## 2020-06-23 ENCOUNTER — Ambulatory Visit (HOSPITAL_COMMUNITY)
Admission: RE | Admit: 2020-06-23 | Discharge: 2020-06-23 | Disposition: A | Discharge status: Home / Self Care | Payer: Medicare Other | Source: Ambulatory Visit | Attending: Pulmonary Disease | Admitting: Pulmonary Disease

## 2020-06-23 ENCOUNTER — Other Ambulatory Visit (HOSPITAL_COMMUNITY): Payer: Self-pay

## 2020-06-23 DIAGNOSIS — U071 COVID-19: Secondary | ICD-10-CM | POA: Insufficient documentation

## 2020-06-23 DIAGNOSIS — Z23 Encounter for immunization: Secondary | ICD-10-CM | POA: Insufficient documentation

## 2020-06-23 MED ORDER — SODIUM CHLORIDE 0.9 % IV SOLN
1200.0000 mg | Freq: Once | INTRAVENOUS | Status: AC
  Administered 2020-06-23: 1200 mg via INTRAVENOUS

## 2020-06-23 MED ORDER — SODIUM CHLORIDE 0.9 % IV SOLN: INTRAVENOUS | Status: DC | PRN

## 2020-06-23 MED ORDER — ALBUTEROL SULFATE HFA 108 (90 BASE) MCG/ACT IN AERS: 2.0000 | INHALATION_SPRAY | Freq: Once | RESPIRATORY_TRACT | Status: DC | PRN

## 2020-06-23 MED ORDER — DIPHENHYDRAMINE HCL 50 MG/ML IJ SOLN: 50.0000 mg | Freq: Once | INTRAMUSCULAR | Status: DC | PRN

## 2020-06-23 MED ORDER — EPINEPHRINE 0.3 MG/0.3ML IJ SOAJ: 0.3000 mg | Freq: Once | INTRAMUSCULAR | Status: DC | PRN

## 2020-06-23 MED ORDER — FAMOTIDINE IN NACL 20-0.9 MG/50ML-% IV SOLN: 20.0000 mg | Freq: Once | INTRAVENOUS | Status: DC | PRN

## 2020-06-23 MED ORDER — METHYLPREDNISOLONE SODIUM SUCC 125 MG IJ SOLR: 125.0000 mg | Freq: Once | INTRAMUSCULAR | Status: DC | PRN

## 2020-06-23 NOTE — Discharge Instructions (Signed)

## 2020-06-23 NOTE — Progress Notes (Signed)
  Diagnosis: COVID-19  Physician: Dr. Joya Gaskins  Procedure: Covid Infusion Clinic Med: casirivimab\imdevimab infusion - Provided patient with casirivimab\imdevimab fact sheet for patients, parents and caregivers prior to infusion.  Complications: No immediate complications noted.  Discharge: Discharged home   Robert Kirby 06/23/2020

## 2020-06-28 DIAGNOSIS — R059 Cough, unspecified: Secondary | ICD-10-CM

## 2020-06-30 MED ORDER — PROMETHAZINE-DM 6.25-15 MG/5ML PO SYRP: 5.0000 mL | ORAL_SOLUTION | Freq: Four times a day (QID) | ORAL | 0 refills | Status: DC | PRN

## 2020-07-03 DIAGNOSIS — Z20828 Contact with and (suspected) exposure to other viral communicable diseases: Secondary | ICD-10-CM | POA: Diagnosis not present

## 2020-07-08 DIAGNOSIS — Z23 Encounter for immunization: Secondary | ICD-10-CM | POA: Diagnosis not present

## 2020-07-12 DIAGNOSIS — Z7901 Long term (current) use of anticoagulants: Secondary | ICD-10-CM | POA: Diagnosis not present

## 2020-07-27 DIAGNOSIS — C37 Malignant neoplasm of thymus: Secondary | ICD-10-CM | POA: Diagnosis not present

## 2020-07-27 DIAGNOSIS — D6861 Antiphospholipid syndrome: Secondary | ICD-10-CM | POA: Diagnosis not present

## 2020-07-27 DIAGNOSIS — G0481 Other encephalitis and encephalomyelitis: Secondary | ICD-10-CM | POA: Diagnosis not present

## 2020-07-31 ENCOUNTER — Ambulatory Visit: Payer: Medicare Other | Admitting: Internal Medicine

## 2020-08-01 ENCOUNTER — Other Ambulatory Visit: Payer: Self-pay

## 2020-08-01 ENCOUNTER — Encounter: Payer: Self-pay | Admitting: Family Medicine

## 2020-08-01 ENCOUNTER — Telehealth (INDEPENDENT_AMBULATORY_CARE_PROVIDER_SITE_OTHER): Payer: Medicare Other | Admitting: Family Medicine

## 2020-08-01 VITALS — BP 139/92 | HR 103 | Temp 96.4°F | Ht 72.0 in | Wt 210.0 lb

## 2020-08-01 DIAGNOSIS — J208 Acute bronchitis due to other specified organisms: Secondary | ICD-10-CM | POA: Diagnosis not present

## 2020-08-01 DIAGNOSIS — B9689 Other specified bacterial agents as the cause of diseases classified elsewhere: Secondary | ICD-10-CM

## 2020-08-01 MED ORDER — PREDNISONE 20 MG PO TABS: 40.0000 mg | ORAL_TABLET | Freq: Every day | ORAL | 0 refills | Status: AC

## 2020-08-01 MED ORDER — CEFDINIR 300 MG PO CAPS: 300.0000 mg | ORAL_CAPSULE | Freq: Two times a day (BID) | ORAL | 0 refills | Status: AC

## 2020-08-01 MED ORDER — AZITHROMYCIN 250 MG PO TABS: ORAL_TABLET | ORAL | 0 refills | Status: DC

## 2020-08-01 NOTE — Addendum Note (Signed)
Addended by: Ames Coupe on: 08/01/2020 01:01 PM   Modules accepted: Orders

## 2020-08-01 NOTE — Telephone Encounter (Signed)
Dr. Annamaria Boots, Please see patient comment and advise.  We will be happy to schedule an appointment for him.  Is there any specific time frame he needs to be scheduled?  Thank you.

## 2020-08-01 NOTE — Telephone Encounter (Signed)
Called patient as it was easier to schedule appointment over the phone than over [employer].  Scheduled patient for Friday at 11:30 am per Dr. Janee Morn request, Dr. Annamaria Boots approved putting the patient in the hold slot.  Nothing further needed.

## 2020-08-01 NOTE — Progress Notes (Addendum)
Chief Complaint  Patient presents with   Cough    06/15/20 tested positive for Covid. tested 2 weeks later and was negative.   Wheezing    Robert Kirby here for URI complaints. Due to COVID-19 pandemic, we are interacting via web portal for an electronic face-to-face visit. I verified patient's ID using 2 identifiers. Patient agreed to proceed with visit via this method. Patient is at home, I am at office. Patient, his wife and I are present for visit.   Duration: 2.5 weeks  Associated symptoms: shortness of breath, chest tightness and cough Denies: sinus congestion, sinus pain, rhinorrhea, itchy watery eyes, ear pain, ear drainage, sore throat, wheezing, myalgia and fevers Treatment to date: Tylenol Sick contacts: No  Past Medical History:  Diagnosis Date   Addison's disease ([place])    Antiphospholipid antibody syndrome ([place])    Arrhythmia    Pacemaker   Autoimmune encephalomyelitis    Depression    GERD (gastroesophageal reflux disease)    History of blood transfusion    13 units from a GI Bleed.    History of GI bleed    OSA on CPAP    Sleep apnea    on CPAP   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[place]) 2015    BP (!) 139/92 (BP Location: Left Arm, Patient Position: Sitting, Cuff Size: Normal)    Pulse (!) 103    Temp (!) 96.4 F (35.8 C) (Oral)    Ht 6' (1.829 m)    Wt 210 lb (95.3 kg)    SpO2 95%    BMI 28.48 kg/m  No conversational dyspnea Nml affect and mood  Acute bacterial bronchitis - Plan: predniSONE (DELTASONE) 20 MG tablet, cefdinir (OMNICEF) 300 MG capsule  Above, hold Cortef while on pred. Need to call pulm today to make sure this isn't related to his cancer or restrictive lung disease.  Continue to push fluids, practice good hand hygiene, cover mouth when coughing. F/u prn. If starting to experience fevers, shaking, or shortness of breath, seek immediate care. Pt and his wife voiced understanding and agreement to the plan.  [place],  DO 08/01/20 1:00 PM

## 2020-08-01 NOTE — Telephone Encounter (Signed)
Please get him in with me or an app this week if possible.

## 2020-08-02 ENCOUNTER — Encounter: Payer: Self-pay | Admitting: Internal Medicine

## 2020-08-03 NOTE — Progress Notes (Signed)
M married, Company secretary, never smoker. Previously followed by Dr Lake Bells in 2020- Synopsis: Referred to [facility] in 09/2018 fordyspnea.  He has a complex medical history which includes obstructive sleep apnea on CPAP, a diagnosis  of thymic carcinoma treated with chemo therapy and radiation in 2015.  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  This was treated with prednisone.  He has followed at [facility] and lives in [place]. 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 Pacemaker for complete heart block Seasonal Allergic Rhinitis, Paralyzed hemidiaphragm. Original sleep study in [place] with new CPAP about 2018. Unknown settings.  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  This was treated with prednisone.  He has followed at [facility] and lives in [place]. 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 Pacemaker for complete heart block Seasonal Allergic Rhinitis, Paralyzed hemidiaphragm Split PSG 02/26/20- AHI 35.1/ hr, desat to 79%, CPAP rec auto 5-15, body weight 240 lbs -----Pt states that he does use the cpap but is not using it every  night. Pt states at times he will feel like he has a bubbling feeling in stomach. Body weight today 221 lbs O2 2l [employer] Download-  CPAP auto 5-15 was changed to 10 as of 7/19. He c/o "bubbling in stomach despite simethicone.Large FFM is always leaking. We can reduce pressure for trial. Bothersome postnasal drip- discussed chlortrimeton/ nasalcrom. Separate room from wife, adjustable bed, but elevating HOB is uncomfortable.  Using clonazepam 1 mg and mirtazapine for sleep, now trying melatonin 13 mg per [facility] for RBD. Memory" not great" since encephalitis. O2 isn't bled into CPAP, so he is using either O2 or CPAP. Uses O2 in day, but feels he doesn't need it usually for sleep. 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   08/04/20- 69 yoMnever smoker followed for OSA, RBD, complicated by  thymic carcinoma treated with chemo therapy and radiation in 2015., Autoimmune Encephalitis with Seizure treated with plasmapheresis and rituximab.  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  This was treated with prednisone.  He has followed at [facility] and lives in [place]. 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 Pacemaker for complete heart block Seasonal Allergic Rhinitis, Paralyzed hemidiaphragm CPAP auto 13/ [employer],    O2 [employer] 2L (here on room air) Download-compliance 40%, AHI 1.9/ hr.  Body Weight today- 214 lbs Covid vax- 3 Ph[employer]                                               Wife here Flu vax- had Using clonazepam 1 mg and mirtazapine for sleep, now trying melatonin 13 mg per [facility] for  RBD. ------Had Covid end of Sept, 2021. chronic cough, wheezing, pt still has sob on exertion .  He and wife both triple vax, but both had Covid, relatively mild, Rx'd MAB. He still has some cough, scant phlegm white. Still easily tired.  Using CPAP less  during this. Bothered by airflow from mask vents. Had mask-fitting last month at [facility].  He usess either O2 or CPAP for sleep- discussed.  Known weak diaphragm. Tends to run mild tachycardia.   ROS-see HPI   + = positive Constitutional:   + weight loss, night sweats, fevers, chills, +fatigue, lassitude. HEENT:    headaches, difficulty swallowing, tooth/dental problems, sore throat,       sneezing, itching, ear ache, +nasal congestion, post nasal drip, snoring CV:    chest pain, orthopnea, PND, swelling in lower extremities, anasarca,                                  dizziness, palpitations Resp:   +shortness of breath with exertion or at rest.                productive cough,   non-productive cough, coughing up of blood.              change in color of mucus.  wheezing.   Skin:    rash or lesions. GI:  No-   heartburn, indigestion, abdominal pain, nausea, vomiting, diarrhea,                 change in bowel habits, loss of appetite GU: dysuria, change in color of urine, no urgency or frequency.   flank pain. MS:   +joint pain, stiffness, decreased range of motion, back pain. Neuro-     nothing unusual Psych:  change in mood or affect.  +depression or +anxiety.   +memory loss.  OBJ- Physical Exam General- Alert, Oriented, Affect-appropriate, Distress- none acute,  Skin- rash-none, lesions- none, excoriation- none Lymphadenopathy- none Head- atraumatic            Eyes- Gross vision intact, PERRLA, conjunctivae and secretions clear            Ears- +Hearing aid            Nose- Clear, no-Septal dev, mucus, polyps, erosion, perforation             Throat- Mallampati II-III , mucosa clear , drainage- none, tonsils- atrophic, + teeth Neck- flexible , trachea midline, no stridor , thyroid nl, carotid no bruit Chest - symmetrical excursion , unlabored           Heart/CV- RRR/ 112 , no murmur , no gallop  , no rub, nl s1 s2                           - JVD- none , edema- none, stasis changes- none,  varices- none           Lung- clear to P&A, wheeze- none, cough- none , dullness+ R base, rub- none           Chest wall- + R port, + L pacemaker Abd-  Br/ Gen/ Rectal- Not done, not indicated Extrem- cyanosis- none, clubbing, none, atrophy- none, strength- nl Neuro- grossly intact to observation

## 2020-08-04 ENCOUNTER — Other Ambulatory Visit: Payer: Self-pay

## 2020-08-04 ENCOUNTER — Encounter: Payer: Self-pay | Admitting: Internal Medicine

## 2020-08-04 ENCOUNTER — Ambulatory Visit (INDEPENDENT_AMBULATORY_CARE_PROVIDER_SITE_OTHER): Payer: Medicare Other | Admitting: Internal Medicine

## 2020-08-04 ENCOUNTER — Ambulatory Visit (INDEPENDENT_AMBULATORY_CARE_PROVIDER_SITE_OTHER): Payer: Medicare Other

## 2020-08-04 VITALS — BP 124/72 | HR 106 | Temp 97.3°F | Ht 72.0 in | Wt 214.0 lb

## 2020-08-04 DIAGNOSIS — R0600 Dyspnea, unspecified: Secondary | ICD-10-CM

## 2020-08-04 DIAGNOSIS — U099 Post covid-19 condition, unspecified: Secondary | ICD-10-CM

## 2020-08-04 DIAGNOSIS — R918 Other nonspecific abnormal finding of lung field: Secondary | ICD-10-CM | POA: Diagnosis not present

## 2020-08-04 DIAGNOSIS — R0609 Other forms of dyspnea: Secondary | ICD-10-CM

## 2020-08-04 DIAGNOSIS — G4733 Obstructive sleep apnea (adult) (pediatric): Secondary | ICD-10-CM | POA: Diagnosis not present

## 2020-08-04 DIAGNOSIS — Z9989 Dependence on other enabling machines and devices: Secondary | ICD-10-CM

## 2020-08-04 DIAGNOSIS — C3490 Malignant neoplasm of unspecified part of unspecified bronchus or lung: Secondary | ICD-10-CM | POA: Diagnosis not present

## 2020-08-04 NOTE — Assessment & Plan Note (Addendum)
Weakness and chronic tachycardia contribute. No obvious CHF on exam. Watch for post-covid CM. Plan- CXR, discussed O2 use with activity, walk for endurance. Watch for anemia- CBCs had been ordered.

## 2020-08-04 NOTE — Assessment & Plan Note (Addendum)
He is struggling with comfort/ compliance. Options discussed. He chooses not to have another mask fitting yet. Plan- continue CPAP 13 or auto 5-15/ [employer]. Work on compliance goals. Bleed in O2 2L.

## 2020-08-04 NOTE — Patient Instructions (Signed)
Order- CXR   Dx Cough, post covid  Ok to adjust your CPAP humidifier  Order- mask fitting at sleep center  Ask Dr Nani Ravens to look at your back and numb area  We  Can continue CPAP auto 5-15 and O2 2L  Please call if we can help

## 2020-08-08 ENCOUNTER — Other Ambulatory Visit: Payer: Self-pay

## 2020-08-08 ENCOUNTER — Emergency Department (HOSPITAL_BASED_OUTPATIENT_CLINIC_OR_DEPARTMENT_OTHER)
Admission: EM | Admit: 2020-08-08 | Discharge: 2020-08-09 | Disposition: A | Discharge status: Home / Self Care | Payer: Medicare Other | Attending: Emergency Medicine | Admitting: Emergency Medicine

## 2020-08-08 ENCOUNTER — Encounter (HOSPITAL_BASED_OUTPATIENT_CLINIC_OR_DEPARTMENT_OTHER): Payer: Self-pay | Admitting: Emergency Medicine

## 2020-08-08 DIAGNOSIS — K219 Gastro-esophageal reflux disease without esophagitis: Secondary | ICD-10-CM | POA: Diagnosis not present

## 2020-08-08 DIAGNOSIS — R109 Unspecified abdominal pain: Secondary | ICD-10-CM | POA: Diagnosis not present

## 2020-08-08 DIAGNOSIS — Z950 Presence of cardiac pacemaker: Secondary | ICD-10-CM | POA: Diagnosis not present

## 2020-08-08 DIAGNOSIS — Z85238 Personal history of other malignant neoplasm of thymus: Secondary | ICD-10-CM | POA: Diagnosis not present

## 2020-08-08 DIAGNOSIS — N400 Enlarged prostate without lower urinary tract symptoms: Secondary | ICD-10-CM | POA: Diagnosis not present

## 2020-08-08 DIAGNOSIS — N2889 Other specified disorders of kidney and ureter: Secondary | ICD-10-CM | POA: Diagnosis not present

## 2020-08-08 DIAGNOSIS — M545 Low back pain, unspecified: Secondary | ICD-10-CM | POA: Diagnosis not present

## 2020-08-08 DIAGNOSIS — Z7901 Long term (current) use of anticoagulants: Secondary | ICD-10-CM | POA: Diagnosis not present

## 2020-08-08 DIAGNOSIS — Z79899 Other long term (current) drug therapy: Secondary | ICD-10-CM | POA: Diagnosis not present

## 2020-08-08 DIAGNOSIS — K5730 Diverticulosis of large intestine without perforation or abscess without bleeding: Secondary | ICD-10-CM | POA: Diagnosis not present

## 2020-08-08 DIAGNOSIS — K7689 Other specified diseases of liver: Secondary | ICD-10-CM | POA: Diagnosis not present

## 2020-08-08 DIAGNOSIS — M5459 Other low back pain: Secondary | ICD-10-CM | POA: Diagnosis not present

## 2020-08-08 NOTE — ED Triage Notes (Signed)
Patient arrived via POV c/o right sided back pain/right flank pain x 1.5 days. Patient states pain radiates from right flank to right lower back then up center of back. Patient is AO x 4, VS WDL, stooped gait. Patient states 1000 mg tylenol at 2000.

## 2020-08-09 ENCOUNTER — Emergency Department (HOSPITAL_BASED_OUTPATIENT_CLINIC_OR_DEPARTMENT_OTHER): Payer: Medicare Other

## 2020-08-09 DIAGNOSIS — M545 Low back pain, unspecified: Secondary | ICD-10-CM | POA: Diagnosis not present

## 2020-08-09 DIAGNOSIS — N400 Enlarged prostate without lower urinary tract symptoms: Secondary | ICD-10-CM | POA: Diagnosis not present

## 2020-08-09 DIAGNOSIS — K7689 Other specified diseases of liver: Secondary | ICD-10-CM | POA: Diagnosis not present

## 2020-08-09 DIAGNOSIS — N2889 Other specified disorders of kidney and ureter: Secondary | ICD-10-CM | POA: Diagnosis not present

## 2020-08-09 DIAGNOSIS — K5730 Diverticulosis of large intestine without perforation or abscess without bleeding: Secondary | ICD-10-CM | POA: Diagnosis not present

## 2020-08-09 DIAGNOSIS — M5459 Other low back pain: Secondary | ICD-10-CM | POA: Diagnosis not present

## 2020-08-09 LAB — CBC WITH DIFFERENTIAL/PLATELET
Abs Immature Granulocytes: 0.06 10*3/uL (ref 0.00–0.07)
Basophils Absolute: 0 10*3/uL (ref 0.0–0.1)
Basophils Relative: 0 %
Eosinophils Absolute: 0.1 10*3/uL (ref 0.0–0.5)
Eosinophils Relative: 1 %
HCT: 44.2 % (ref 39.0–52.0)
Hemoglobin: 14.6 g/dL (ref 13.0–17.0)
Immature Granulocytes: 1 %
Lymphocytes Relative: 11 %
Lymphs Abs: 1.2 10*3/uL (ref 0.7–4.0)
MCH: 29.6 pg (ref 26.0–34.0)
MCHC: 33 g/dL (ref 30.0–36.0)
MCV: 89.5 fL (ref 80.0–100.0)
Monocytes Absolute: 1.3 10*3/uL — ABNORMAL HIGH (ref 0.1–1.0)
Monocytes Relative: 12 %
Neutro Abs: 8.2 10*3/uL — ABNORMAL HIGH (ref 1.7–7.7)
Neutrophils Relative %: 75 %
Platelets: 267 10*3/uL (ref 150–400)
RBC: 4.94 MIL/uL (ref 4.22–5.81)
RDW: 14.4 % (ref 11.5–15.5)
WBC: 10.9 10*3/uL — ABNORMAL HIGH (ref 4.0–10.5)
nRBC: 0 % (ref 0.0–0.2)

## 2020-08-09 LAB — COMPREHENSIVE METABOLIC PANEL
ALT: 30 U/L (ref 0–44)
AST: 30 U/L (ref 15–41)
Albumin: 3.9 g/dL (ref 3.5–5.0)
Alkaline Phosphatase: 72 U/L (ref 38–126)
Anion gap: 10 (ref 5–15)
BUN: 17 mg/dL (ref 8–23)
CO2: 28 mmol/L (ref 22–32)
Calcium: 8.9 mg/dL (ref 8.9–10.3)
Chloride: 98 mmol/L (ref 98–111)
Creatinine, Ser: 0.93 mg/dL (ref 0.61–1.24)
GFR, Estimated: >60 mL/min (ref >60)
Glucose, Bld: 115 mg/dL — ABNORMAL HIGH (ref 70–99)
Potassium: 3.9 mmol/L (ref 3.5–5.1)
Sodium: 136 mmol/L (ref 135–145)
Total Bilirubin: 0.5 mg/dL (ref 0.3–1.2)
Total Protein: 7.3 g/dL (ref 6.5–8.1)

## 2020-08-09 LAB — URINALYSIS, ROUTINE W REFLEX MICROSCOPIC
Bilirubin Urine: NEGATIVE
Glucose, UA: NEGATIVE mg/dL
Hgb urine dipstick: NEGATIVE
Ketones, ur: NEGATIVE mg/dL
Leukocytes,Ua: NEGATIVE
Nitrite: NEGATIVE
Protein, ur: NEGATIVE mg/dL
Specific Gravity, Urine: 1.015 (ref 1.005–1.030)
pH: 7.5 (ref 5.0–8.0)

## 2020-08-09 LAB — PROTIME-INR
INR: 2.1 — ABNORMAL HIGH (ref 0.8–1.2)
Prothrombin Time: 23.1 seconds — ABNORMAL HIGH (ref 11.4–15.2)

## 2020-08-09 MED ORDER — OXYCODONE-ACETAMINOPHEN 5-325 MG PO TABS: 1.0000 | ORAL_TABLET | ORAL | 0 refills | Status: DC | PRN

## 2020-08-09 MED ORDER — ONDANSETRON HCL 4 MG/2ML IJ SOLN
4.0000 mg | Freq: Once | INTRAMUSCULAR | Status: AC
  Administered 2020-08-09: 4 mg via INTRAVENOUS
  Filled 2020-08-09: qty 2

## 2020-08-09 MED ORDER — HYDROMORPHONE HCL 1 MG/ML IJ SOLN
1.0000 mg | Freq: Once | INTRAMUSCULAR | Status: AC
  Administered 2020-08-09: 1 mg via INTRAVENOUS
  Filled 2020-08-09: qty 1

## 2020-08-09 NOTE — Discharge Instructions (Signed)
The CAT scan showed that you may have had some worsening of your thymic carcinoma that might be explaining some of his pain.  Please contact your oncologist at [facility] in the morning to schedule further follow-up and treatment.

## 2020-08-09 NOTE — ED Provider Notes (Signed)
[facility] EMERGENCY DEPARTMENT Provider Note   CSN: [id number] Arrival date & time: 08/08/20  2338     History Chief Complaint  Patient presents with  . Back Pain    Robert Kirby is a 69 y.o. male.  Patient presents with complaints of right-sided flank pain.  He reports that he has been noticing a soreness around the belt line on the right side for a couple of weeks.  For the last 1-1/2 days, however, the pain has worsened.  Tonight he has gotten severe pain in the right flank area.  He has not noticed any urinary symptoms.  He has never had similar symptoms.  Denies any overlying rash.        Past Medical History:  Diagnosis Date  . Addison's disease ([place])   . Antiphospholipid antibody syndrome ([place])   . Arrhythmia    Pacemaker  . Autoimmune encephalomyelitis   . Depression   . GERD (gastroesophageal reflux disease)   . History of blood transfusion    13 units from a GI Bleed.   Marland Kitchen History of GI bleed   . OSA on CPAP   . Sleep apnea    on CPAP  . 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 Seizure ([place]) 02/04/2020  . 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 Left-sided low back pain without sciatica 04/02/2019  . Dyspnea on exertion 04/02/2019  . Right inguinal hernia 03/31/2019  . Seasonal allergic rhinitis due to pollen 12/19/2017  . Depression   . OSA on CPAP   . Autoimmune encephalomyelitis   . Addison's disease ([place])   . Antiphospholipid antibody syndrome ([place])   . Thymic carcinoma [facility])     Past Surgical History:  Procedure Laterality Date  . CHOLECYSTECTOMY  2000  . COLONOSCOPY  2016   was told to have one in 3 years. From the [facility] [place] GI Dr. Amie Critchley  . ESOPHAGOGASTRODUODENOSCOPY     [facility] different doctor least before 2014/15  . NOSE SURGERY     removed polyps from nose around 1996. Said he broke nose in 4 grade and needed surgery on it  . PACEMAKER PLACEMENT     . TONSILLECTOMY AND ADENOIDECTOMY  1959       Family History  Problem Relation Age of Onset  . Depression Mother   . High blood pressure Daughter   . Colon cancer Neg Hx   . Esophageal cancer Neg Hx     Social History   Tobacco Use  . Smoking status: Never Smoker  . Smokeless tobacco: Never Used  Vaping Use  . Vaping Use: Never used  Substance Use Topics  . Alcohol use: Yes    Comment: rare   . Drug use: No    Home Medications Prior to Admission medications   Medication Sig Start Date End Date Taking? Authorizing Provider  busPIRone (BUSPAR) 30 MG tablet Take 30 mg by mouth 2 (two) times daily.    [provider]  carbamazepine (TEGRETOL XR) 200 MG 12 hr tablet Take by mouth. 03/24/20   [provider]  carvedilol (COREG) 3.125 MG tablet Take 3.125 mg by mouth 2 (two) times daily. 03/03/20   [provider]  clonazePAM (KLONOPIN) 0.5 MG tablet Take 0.5 mg by mouth 2 (two) times daily.    [provider]  DULoxetine (CYMBALTA) 60 MG capsule Take 120 mg by mouth daily.  [provider]  fluticasone (FLONASE) 50 MCG/ACT nasal spray SHAKE LIQUID AND USE 2 SPRAYS IN [facility] NOSTRIL DAILY 05/22/20   Wendling, Crosby Oyster, DO  furosemide (LASIX) 20 MG tablet Take 1 tablet by mouth daily. 02/19/19 02/19/20  [provider]  hydrocortisone (CORTEF) 10 MG tablet TAKE 2 TABLETS IN THE MORNING AND 1 TABLET IN THE EVENING FOR ADDISONS DISEASE 01/03/20   Shelda Pal, DO  lansoprazole (PREVACID) 15 MG capsule TAKE 1 CAPSULE DAILY 01/31/20   Wendling, Crosby Oyster, DO  levocetirizine (XYZAL) 5 MG tablet Take 1 tablet (5 mg total) by mouth every evening. 04/07/20   Wendling, Crosby Oyster, DO  LORazepam (ATIVAN) 1 MG tablet Take 0.5-1 tablets (0.5-1 mg total) by mouth every 8 (eight) hours as needed for anxiety. 10/15/19   Shelda Pal, DO  losartan (COZAAR) 25 MG tablet Take 25 mg by mouth daily.    [provider]  mirtazapine (REMERON) 45 MG tablet Take by mouth. 02/23/20 05/23/20  [provider]  oxyCODONE-acetaminophen (PERCOCET) 5-325 MG tablet Take 1-2 tablets by mouth every 4 (four) hours as needed. 08/09/20   Orpah Greek, MD  riTUXimab (RITUXAN) 100 MG/10ML injection Inject into the vein every 6 (six) months.    [provider]  rosuvastatin (CRESTOR) 10 MG tablet Take 10 mg by mouth at bedtime. 04/25/20   [provider]  warfarin (COUMADIN) 5 MG tablet Take 1/2 tablet daily except 1  tablet  On Tuesday and Saturday  or  As directed 02/24/20   [provider]    Allergies    Other and Tetracyclines & related  Review of Systems   Review of Systems  Genitourinary: Positive for flank pain. Negative for dysuria and hematuria.  All other systems reviewed and are negative.   Physical Exam Updated Vital Signs BP 120/78   Pulse 94   Temp 97.8 F (36.6 C) (Oral)   Resp 19   Ht 6' (1.829 m)   Wt 93.9 kg   SpO2 93%   BMI 28.07 kg/m   Physical Exam Vitals and nursing note reviewed.  Constitutional:      General: He is not in acute distress.    Appearance: Normal appearance. He is well-developed.  HENT:     Head: Normocephalic and atraumatic.     Right Ear: Hearing normal.     Left Ear: Hearing normal.     Nose: Nose normal.  Eyes:     Conjunctiva/sclera: Conjunctivae normal.     Pupils: Pupils are equal, round, and reactive to light.  Cardiovascular:     Rate and Rhythm: Regular rhythm.     Heart sounds: S1 normal and S2 normal. No murmur heard.  No friction rub. No gallop.   Pulmonary:     Effort: Pulmonary effort is normal. No respiratory distress.     Breath sounds: Normal breath sounds.  Chest:     Chest wall: No tenderness.  Abdominal:     General: Bowel sounds are normal.     Palpations: Abdomen is soft.     Tenderness: There is no abdominal tenderness. There is no guarding or rebound. Negative signs include  Murphy's sign and McBurney's sign.     Hernia: No hernia is present.  Musculoskeletal:        General: Normal range of motion.     Cervical back: Normal range of motion and neck supple.       Back:  Skin:    General: Skin  is warm and dry.     Findings: No rash.  Neurological:     Mental Status: He is alert and oriented to person, place, and time.     GCS: GCS eye subscore is 4. GCS verbal subscore is 5. GCS motor subscore is 6.     Cranial Nerves: No cranial nerve deficit.     Sensory: No sensory deficit.     Coordination: Coordination normal.  Psychiatric:        Speech: Speech normal.        Behavior: Behavior normal.        Thought Content: Thought content normal.     ED Results / Procedures / Treatments   Labs (all labs ordered are listed, but only abnormal results are displayed) Labs Reviewed  CBC WITH DIFFERENTIAL/PLATELET - Abnormal; Notable for the following components:      Result Value   WBC 10.9 (*)    Neutro Abs 8.2 (*)    Monocytes Absolute 1.3 (*)    All other components within normal limits  COMPREHENSIVE METABOLIC PANEL - Abnormal; Notable for the following components:   Glucose, Bld 115 (*)    All other components within normal limits  PROTIME-INR - Abnormal; Notable for the following components:   Prothrombin Time 23.1 (*)    INR 2.1 (*)    All other components within normal limits  URINALYSIS, ROUTINE W REFLEX MICROSCOPIC    EKG None  Radiology CT RENAL STONE STUDY  Result Date: 08/09/2020 CLINICAL DATA:  Initial evaluation for acute right back/flank pain for 1.5 days. EXAM: CT ABDOMEN AND PELVIS WITHOUT CONTRAST TECHNIQUE: Multidetector CT imaging of the abdomen and pelvis was performed following the standard protocol without IV contrast. COMPARISON:  Prior CT from 06/11/2016. FINDINGS: Lower chest: Ill-defined mass with central dystrophic calcification partially visualized at the anterior mediastinum (series 2, image 1). Additional right  infrahilar mass and/or adenopathy measures up to 5.4 cm (series 2, image 6). This appears progressed from prior CT. Now seen are multiple scattered pleural based nodules about both lung bases, new as compared to most recent CT from 2017. The largest of these measures 4.8 cm at the left lung base (series 3, image 11). r largest on the right seen at the right middle lobe in measures 2 cm (series 3, image 8). Irregular right pleural effusion with extension along the visualized right minor fissure is seen. Findings concerning for progressive malignancy. Cardiac pacemaker electrodes partially visualized. Hepatobiliary: Multiple scattered hypodense lesions noted throughout the liver, largest of which measures 1.9 cm in the left hepatic lobe (series 2, image 19). Findings are not well characterized on this noncontrast examination, but are similar from prior study, and favored to be benign in nature, possibly reflecting small cyst. Gallbladder surgically absent. Mild dilatation of the common bile duct most likely reflects post cholecystectomy changes. No intrahepatic biliary dilatation. Pancreas: Pancreas within normal limits. Spleen: Spleen within normal limits. Adrenals/Urinary Tract: Adrenal glands are normal. Kidneys equal in size without nephrolithiasis or hydronephrosis. No radiopaque calculi seen along the course of either renal collecting system. No hydronephrosis or hydroureter. Few scattered hyperdense lesions noted about the kidneys bilaterally, largest of which measures 1.4 cm at the upper pole the right kidney. Findings are indeterminate. Partially distended bladder within normal limits. No layering stones within the bladder lumen. Stomach/Bowel: Stomach within normal limits. Small duodenal diverticulum noted without associated inflammation. No evidence for bowel obstruction. Normal appendix. Colonic diverticulosis without evidence for acute diverticulitis. Vascular/Lymphatic: Intra-abdominal aorta of  normal  caliber. No pathologically enlarged intra-abdominopelvic lymph nodes. Reproductive: Prostate enlarged measuring 5.4 cm in transverse diameter. Other: Small bilateral fat containing inguinal hernias noted without associated inflammation. No free air or fluid. Musculoskeletal: No acute osseous abnormality. Abnormal sclerotic lesion noted involving the right lateral seventh rib (series 4, image 48). Additional small sclerotic foci in noted involving the T12 and L1 vertebral bodies. Few small sclerotic foci in noted involving the left iliac wing measure up to 12 mm (series 2, image 67). Findings are indeterminate, but could reflect osseous metastatic disease. IMPRESSION: 1. Multiple new pleural based nodules involving the visualized lung bases, concerning for progressive malignancy. Associated irregular right pleural effusion. Further assessment with dedicated contrast-enhanced chest CT suggested for complete evaluation. 2. Scattered sclerotic lesions involving the right seventh rib, thoracolumbar spine, and pelvis as above, indeterminate, but could reflect osseous metastatic disease. 3. No CT evidence for nephrolithiasis or obstructive uropathy. 4. Colonic diverticulosis without evidence for acute diverticulitis. 5. Enlarged prostate. 6. Few scattered hyperdense lesions about the kidneys bilaterally, indeterminate. While these may reflect proteinaceous and/or hemorrhagic cysts, these are not well characterized on this noncontrast examination. Follow-up examination with dedicated renal mass protocol CT and/or MRI recommended for further evaluation. This could be performed on a nonemergent basis. Electronically Signed   By: Jeannine Boga M.D.   On: 08/09/2020 01:43    Procedures Procedures (including critical care time)  Medications Ordered in ED Medications  HYDROmorphone (DILAUDID) injection 1 mg (1 mg Intravenous Given 08/09/20 0029)  ondansetron (ZOFRAN) injection 4 mg (4 mg Intravenous Given 08/09/20  0029)    ED Course  I have reviewed the triage vital signs and the nursing notes.  Pertinent labs & imaging results that were available during my care of the patient were reviewed by me and considered in my medical decision making (see chart for details).    MDM Rules/Calculators/A&P                          Patient with history of thymic carcinoma and antiphospholipid antibody syndrome, on warfarin, presents to the emergency department with right flank pain.  He has been noticing some discomfort around the belt line on the right side for a couple of weeks but in the last 1-1/2 days pain has intensified.  There is no rash to suggest shingles.  He does have soft tissue tenderness now in the right lower back area.  He is not experiencing any chest pain.  He does not have any shortness of breath.  Pain is not thoracic, nothing to suggest PE.  Patient's blood work was unremarkable.  Urinalysis does not suggest infection.  CT renal stone study does not show any ureterolithiasis.  He does, however, have multiple findings concerning for progression of his thymic carcinoma.  This includes the possibility of some bone involvement which might explain his pain.  He does not have any neurologic deficits of the lower extremities to suggest cauda equina.  He feels much better after a dose of analgesia.  He is followed by oncology at [facility].  I discussed the CT findings with him.  He has follow-up scheduled with his oncologist in 2 weeks.  I suggested that he should call his oncologist in the morning and see if they can see him sooner or if there is additional imaging any prior to the appointment.  Will provide patient with analgesia prescription.  Final Clinical Impression(s) / ED Diagnoses Final diagnoses:  Acute right-sided  low back pain without sciatica    Rx / DC Orders ED Discharge Orders         Ordered    oxyCODONE-acetaminophen (PERCOCET) 5-325 MG tablet  Every 4 hours PRN        08/09/20 0215            Orpah Greek, MD 08/09/20 [phone]

## 2020-08-09 NOTE — ED Notes (Signed)
Pt to CT

## 2020-08-09 NOTE — ED Notes (Signed)
EDP at bedside  

## 2020-08-11 ENCOUNTER — Inpatient Hospital Stay: Payer: Medicare Other | Attending: Family

## 2020-08-11 ENCOUNTER — Ambulatory Visit (INDEPENDENT_AMBULATORY_CARE_PROVIDER_SITE_OTHER): Payer: Medicare Other | Admitting: Family Medicine

## 2020-08-11 ENCOUNTER — Encounter: Payer: Self-pay | Admitting: Family Medicine

## 2020-08-11 ENCOUNTER — Other Ambulatory Visit: Payer: Self-pay

## 2020-08-11 VITALS — BP 110/72 | HR 107 | Temp 98.7°F | Ht 72.0 in | Wt 213.0 lb

## 2020-08-11 VITALS — BP 126/81 | HR 109 | Temp 97.8°F | Resp 19

## 2020-08-11 DIAGNOSIS — Z452 Encounter for adjustment and management of vascular access device: Secondary | ICD-10-CM | POA: Insufficient documentation

## 2020-08-11 DIAGNOSIS — C37 Malignant neoplasm of thymus: Secondary | ICD-10-CM | POA: Diagnosis not present

## 2020-08-11 DIAGNOSIS — M545 Low back pain, unspecified: Secondary | ICD-10-CM | POA: Diagnosis not present

## 2020-08-11 DIAGNOSIS — Z95828 Presence of other vascular implants and grafts: Secondary | ICD-10-CM

## 2020-08-11 MED ORDER — TRAMADOL HCL 50 MG PO TABS: 50.0000 mg | ORAL_TABLET | Freq: Three times a day (TID) | ORAL | 0 refills | Status: AC | PRN

## 2020-08-11 MED ORDER — SODIUM CHLORIDE 0.9% FLUSH
10.0000 mL | Freq: Once | INTRAVENOUS | Status: AC
  Administered 2020-08-11: 10 mL via INTRAVENOUS
  Filled 2020-08-11: qty 10

## 2020-08-11 MED ORDER — CYCLOBENZAPRINE HCL 10 MG PO TABS: 5.0000 mg | ORAL_TABLET | Freq: Three times a day (TID) | ORAL | 0 refills | Status: DC | PRN

## 2020-08-11 MED ORDER — HEPARIN SOD (PORK) LOCK FLUSH 100 UNIT/ML IV SOLN
500.0000 [IU] | Freq: Once | INTRAVENOUS | Status: AC
  Administered 2020-08-11: 500 [IU] via INTRAVENOUS
  Filled 2020-08-11: qty 5

## 2020-08-11 NOTE — Patient Instructions (Signed)

## 2020-08-11 NOTE — Progress Notes (Signed)
Pt discharged in no apparent distress. Pt left ambulatory without assistance. Pt aware of discharge instructions and verbalized understanding and had no further questions.  

## 2020-08-11 NOTE — Progress Notes (Signed)
Musculoskeletal Exam  Patient: Robert Kirby DOB: Feb 16, 1951  DOS: 08/11/2020  SUBJECTIVE:  Chief Complaint:   Chief Complaint  Patient presents with  . Back Pain    Robert Kirby is a 69 y.o.  male for evaluation and treatment of his back pain.   Onset:  3 days ago.  No inj or change in activity.  Location: lower Character:  dull  Progression of issue:  is unchanged Associated symptoms: No bruising, swelling, redness Denies bowel/bladder incontinence or weakness Treatment: to date has been heat, Tylenol and Percocet. He got nauseous with the Percocet. He is not taking any anti-inflammatories as he is on blood thinners for antiphospholipid antibody syndrome. Neurovascular symptoms: no  Past Medical History:  Diagnosis Date  . Addison's disease ([place])   . Antiphospholipid antibody syndrome ([place])   . Arrhythmia    Pacemaker  . Autoimmune encephalomyelitis   . Depression   . GERD (gastroesophageal reflux disease)   . History of blood transfusion    13 units from a GI Bleed.   Marland Kitchen History of GI bleed   . OSA on CPAP   . Sleep apnea    on CPAP  . 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 No acute distress. HENT: Normocephalic, atraumatic.  Thorax & Lungs:  No accessory muscle use Skin: Warm. Dry. No erythema. No rash.  Musculoskeletal: low back.   Tenderness to palpation: Yes, over the lumbar right paraspinal musculature Deformity: no Ecchymosis: no Straight leg test: negative for Poor hamstring flexibility b/l, worse on the right. Neurologic: Normal sensory function. No focal deficits noted. DTR's equal and symmetric in LE's. No clonus. Psychiatric: Normal mood. Age appropriate judgment and insight. Alert &  oriented x 3.    Assessment:  Acute right-sided low back pain without sciatica - Plan: traMADol (ULTRAM) 50 MG tablet, cyclobenzaprine (FLEXERIL) 10 MG tablet  Plan: Orders as above. Stretches/exercises, heat, ice, Tylenol, Flexeril which he has done well with in the past, and tramadol which may be less nauseating than the Percocet. F/u prn. The patient voiced understanding and agreement to the plan.   [place], DO 08/11/20  2:55 PM

## 2020-08-11 NOTE — Patient Instructions (Addendum)
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 Just make sure the oncology team is aware of the most recent scan.   Heat (pad or rice pillow in microwave) over affected area, 10-15 minutes twice daily.   Ice/cold pack over area for 10-15 min twice daily.  OK to take Tylenol 1000 mg (2 extra strength tabs) or 975 mg (3 regular strength tabs) every 6 hours as needed.  Do not drink alcohol, do any illicit/street drugs, drive or do anything that requires alertness while on this medicine.   Take Flexeril (cyclobenzaprine) 1-2 hours before planned bedtime. If it makes you drowsy, do not take during the day. You can try half a tab the following night.  Let us know if you need anything.  EXERCISES  RANGE OF MOTION (ROM) AND STRETCHING EXERCISES - Low Back Pain Most people with lower back pain will find that their symptoms get worse with excessive bending forward (flexion) or arching at the lower back (extension). The exercises that will help resolve your symptoms will focus on the opposite motion.  If you have pain, numbness or tingling which travels down into your buttocks, leg or foot, the goal of the therapy is for these symptoms to move closer to your back and eventually resolve. Sometimes, these leg symptoms will get better, but your lower back pain may worsen. This is often an indication of progress in your rehabilitation. Be very alert to any changes in your symptoms and the activities in which you participated in the 24 hours prior to the change. Sharing this information with your caregiver will allow him or her to most efficiently treat your condition. These exercises may help you when beginning to rehabilitate your injury. Your symptoms may resolve with or without further involvement from your physician, physical therapist or athletic trainer. While completing these exercises, remember:   Restoring tissue flexibility helps normal  motion to return to the joints. This allows healthier, less painful movement and activity.  An effective stretch should be held for at least 30 seconds.  A stretch should never be painful. You should only feel a gentle lengthening or release in the stretched tissue. FLEXION RANGE OF MOTION AND STRETCHING EXERCISES:  STRETCH - Flexion, Single Knee to Chest   Lie on a firm bed or floor with both legs extended in front of you.  Keeping one leg in contact with the floor, bring your opposite knee to your chest. Hold your leg in place by either grabbing behind your thigh or at your knee.  Pull until you feel a gentle stretch in your low back. Hold 30 seconds.  Slowly release your grasp and repeat the exercise with the opposite side. Repeat 2 times. Complete this exercise 3 times per week.   STRETCH - Flexion, Double Knee to Chest  Lie on a firm bed or floor with both legs extended in front of you.  Keeping one leg in contact with the floor, bring your opposite knee to your chest.  Tense your stomach muscles to support your back and then lift your other knee to your chest. Hold your legs in place by either grabbing behind your thighs or at your knees.  Pull both knees toward your chest until you feel a gentle stretch in your low back. Hold 30 seconds.  Tense your stomach muscles and slowly return one leg at a time to the floor. Repeat 2 times. Complete this exercise 3  times per week.   STRETCH - Low Trunk Rotation  Lie on a firm bed or floor. Keeping your legs in front of you, bend your knees so they are both pointed toward the ceiling and your feet are flat on the floor.  Extend your arms out to the side. This will stabilize your upper body by keeping your shoulders in contact with the floor.  Gently and slowly drop both knees together to one side until you feel a gentle stretch in your low back. Hold for 30 seconds.  Tense your stomach muscles to support your lower back as you bring  your knees back to the starting position. Repeat the exercise to the other side. Repeat 2 times. Complete this exercise at least 3 times per week.   EXTENSION RANGE OF MOTION AND FLEXIBILITY EXERCISES:  STRETCH - Extension, Prone on Elbows   Lie on your stomach on the floor, a bed will be too soft. Place your palms about shoulder width apart and at the height of your head.  Place your elbows under your shoulders. If this is too painful, stack pillows under your chest.  Allow your body to relax so that your hips drop lower and make contact more completely with the floor.  Hold this position for 30 seconds.  Slowly return to lying flat on the floor. Repeat 2 times. Complete this exercise 3 times per week.   RANGE OF MOTION - Extension, Prone Press Ups  Lie on your stomach on the floor, a bed will be too soft. Place your palms about shoulder width apart and at the height of your head.  Keeping your back as relaxed as possible, slowly straighten your elbows while keeping your hips on the floor. You may adjust the placement of your hands to maximize your comfort. As you gain motion, your hands will come more underneath your shoulders.  Hold this position 30 seconds.  Slowly return to lying flat on the floor. Repeat 2 times. Complete this exercise 3 times per week.   RANGE OF MOTION- Quadruped, Neutral Spine   Assume a hands and knees position on a firm surface. Keep your hands under your shoulders and your knees under your hips. You may place padding under your knees for comfort.  Drop your head and point your tailbone toward the ground below you. This will round out your lower back like an angry cat. Hold this position for 30 seconds.  Slowly lift your head and release your tail bone so that your back sags into a large arch, like an old horse.  Hold this position for 30 seconds.  Repeat this until you feel limber in your low back.  Now, find your "sweet spot." This will be the  most comfortable position somewhere between the two previous positions. This is your neutral spine. Once you have found this position, tense your stomach muscles to support your low back.  Hold this position for 30 seconds. Repeat 2 times. Complete this exercise 3 times per week.   STRENGTHENING EXERCISES - Low Back Sprain These exercises may help you when beginning to rehabilitate your injury. These exercises should be done near your "sweet spot." This is the neutral, low-back arch, somewhere between fully rounded and fully arched, that is your least painful position. When performed in this safe range of motion, these exercises can be used for people who have either a flexion or extension based injury. These exercises may resolve your symptoms with or without further involvement from your physician,  physical therapist or athletic trainer. While completing these exercises, remember:   Muscles can gain both the endurance and the strength needed for everyday activities through controlled exercises.  Complete these exercises as instructed by your physician, physical therapist or athletic trainer. Increase the resistance and repetitions only as guided.  You may experience muscle soreness or fatigue, but the pain or discomfort you are trying to eliminate should never worsen during these exercises. If this pain does worsen, stop and make certain you are following the directions exactly. If the pain is still present after adjustments, discontinue the exercise until you can discuss the trouble with your caregiver.  STRENGTHENING - Deep Abdominals, Pelvic Tilt   Lie on a firm bed or floor. Keeping your legs in front of you, bend your knees so they are both pointed toward the ceiling and your feet are flat on the floor.  Tense your lower abdominal muscles to press your low back into the floor. This motion will rotate your pelvis so that your tail bone is scooping upwards rather than pointing at your feet or  into the floor. With a gentle tension and even breathing, hold this position for 3 seconds. Repeat 2 times. Complete this exercise 3 times per week.   STRENGTHENING - Abdominals, Crunches   Lie on a firm bed or floor. Keeping your legs in front of you, bend your knees so they are both pointed toward the ceiling and your feet are flat on the floor. Cross your arms over your chest.  Slightly tip your chin down without bending your neck.  Tense your abdominals and slowly lift your trunk high enough to just clear your shoulder blades. Lifting higher can put excessive stress on the lower back and does not further strengthen your abdominal muscles.  Control your return to the starting position. Repeat 2 times. Complete this exercise 3 times per week.   STRENGTHENING - Quadruped, Opposite UE/LE Lift   Assume a hands and knees position on a firm surface. Keep your hands under your shoulders and your knees under your hips. You may place padding under your knees for comfort.  Find your neutral spine and gently tense your abdominal muscles so that you can maintain this position. Your shoulders and hips should form a rectangle that is parallel with the floor and is not twisted.  Keeping your trunk steady, lift your right hand no higher than your shoulder and then your left leg no higher than your hip. Make sure you are not holding your breath. Hold this position for 30 seconds.  Continuing to keep your abdominal muscles tense and your back steady, slowly return to your starting position. Repeat with the opposite arm and leg. Repeat 2 times. Complete this exercise 3 times per week.   STRENGTHENING - Abdominals and Quadriceps, Straight Leg Raise   Lie on a firm bed or floor with both legs extended in front of you.  Keeping one leg in contact with the floor, bend the other knee so that your foot can rest flat on the floor.  Find your neutral spine, and tense your abdominal muscles to maintain your  spinal position throughout the exercise.  Slowly lift your straight leg off the floor about 6 inches for a count of 3, making sure to not hold your breath.  Still keeping your neutral spine, slowly lower your leg all the way to the floor. Repeat this exercise with each leg 2 times. Complete this exercise 3 times per week.  POSTURE AND  BODY MECHANICS CONSIDERATIONS - Low Back Sprain Keeping correct posture when sitting, standing or completing your activities will reduce the stress put on different body tissues, allowing injured tissues a chance to heal and limiting painful experiences. The following are general guidelines for improved posture.  While reading these guidelines, remember:  The exercises prescribed by your provider will help you have the flexibility and strength to maintain correct postures.  The correct posture provides the best environment for your joints to work. All of your joints have less wear and tear when properly supported by a spine with good posture. This means you will experience a healthier, less painful body.  Correct posture must be practiced with all of your activities, especially prolonged sitting and standing. Correct posture is as important when doing repetitive low-stress activities (typing) as it is when doing a single heavy-load activity (lifting).  RESTING POSITIONS Consider which positions are most painful for you when choosing a resting position. If you have pain with flexion-based activities (sitting, bending, stooping, squatting), choose a position that allows you to rest in a less flexed posture. You would want to avoid curling into a fetal position on your side. If your pain worsens with extension-based activities (prolonged standing, working overhead), avoid resting in an extended position such as sleeping on your stomach. Most people will find more comfort when they rest with their spine in a more neutral position, neither too rounded nor too arched. Lying  on a non-sagging bed on your side with a pillow between your knees, or on your back with a pillow under your knees will often provide some relief. Keep in mind, being in any one position for a prolonged period of time, no matter how correct your posture, can still lead to stiffness.  PROPER SITTING POSTURE In order to minimize stress and discomfort on your spine, you must sit with correct posture. Sitting with good posture should be effortless for a healthy body. Returning to good posture is a gradual process. Many people can work toward this most comfortably by using various supports until they have the flexibility and strength to maintain this posture on their own. When sitting with proper posture, your ears will fall over your shoulders and your shoulders will fall over your hips. You should use the back of the chair to support your upper back. Your lower back will be in a neutral position, just slightly arched. You may place a small pillow or folded towel at the base of your lower back for  support.  When working at a desk, create an environment that supports good, upright posture. Without extra support, muscles tire, which leads to excessive strain on joints and other tissues. Keep these recommendations in mind:  CHAIR:  A chair should be able to slide under your desk when your back makes contact with the back of the chair. This allows you to work closely.  The chair's height should allow your eyes to be level with the upper part of your monitor and your hands to be slightly lower than your elbows.  BODY POSITION  Your feet should make contact with the floor. If this is not possible, use a foot rest.  Keep your ears over your shoulders. This will reduce stress on your neck and low back.  INCORRECT SITTING POSTURES  If you are feeling tired and unable to assume a healthy sitting posture, do not slouch or slump. This puts excessive strain on your back tissues, causing more damage and pain.  Healthier options  include:  Using more support, like a lumbar pillow.  Switching tasks to something that requires you to be upright or walking.  Talking a brief walk.  Lying down to rest in a neutral-spine position.  PROLONGED STANDING WHILE SLIGHTLY LEANING FORWARD  When completing a task that requires you to lean forward while standing in one place for a long time, place either foot up on a stationary 2-4 inch high object to help maintain the best posture. When both feet are on the ground, the lower back tends to lose its slight inward curve. If this curve flattens (or becomes too large), then the back and your other joints will experience too much stress, tire more quickly, and can cause pain.  CORRECT STANDING POSTURES Proper standing posture should be assumed with all daily activities, even if they only take a few moments, like when brushing your teeth. As in sitting, your ears should fall over your shoulders and your shoulders should fall over your hips. You should keep a slight tension in your abdominal muscles to brace your spine. Your tailbone should point down to the ground, not behind your body, resulting in an over-extended swayback posture.   INCORRECT STANDING POSTURES  Common incorrect standing postures include a forward head, locked knees and/or an excessive swayback. WALKING Walk with an upright posture. Your ears, shoulders and hips should all line-up.  PROLONGED ACTIVITY IN A FLEXED POSITION When completing a task that requires you to bend forward at your waist or lean over a low surface, try to find a way to stabilize 3 out of 4 of your limbs. You can place a hand or elbow on your thigh or rest a knee on the surface you are reaching across. This will provide you more stability, so that your muscles do not tire as quickly. By keeping your knees relaxed, or slightly bent, you will also reduce stress across your lower back. CORRECT LIFTING TECHNIQUES  DO :  Assume a wide  stance. This will provide you more stability and the opportunity to get as close as possible to the object which you are lifting.  Tense your abdominals to brace your spine. Bend at the knees and hips. Keeping your back locked in a neutral-spine position, lift using your leg muscles. Lift with your legs, keeping your back straight.  Test the weight of unknown objects before attempting to lift them.  Try to keep your elbows locked down at your sides in order get the best strength from your shoulders when carrying an object.     Always ask for help when lifting heavy or awkward objects. INCORRECT LIFTING TECHNIQUES DO NOT:   Lock your knees when lifting, even if it is a small object.  Bend and twist. Pivot at your feet or move your feet when needing to change directions.  Assume that you can safely pick up even a paperclip without proper posture.

## 2020-08-18 ENCOUNTER — Other Ambulatory Visit: Payer: Self-pay

## 2020-08-18 ENCOUNTER — Encounter (HOSPITAL_COMMUNITY): Payer: Self-pay | Admitting: Emergency Medicine

## 2020-08-18 ENCOUNTER — Emergency Department (HOSPITAL_COMMUNITY): Payer: Medicare Other

## 2020-08-18 ENCOUNTER — Emergency Department (HOSPITAL_COMMUNITY)
Admission: EM | Admit: 2020-08-18 | Discharge: 2020-08-18 | Disposition: A | Discharge status: Home / Self Care | Payer: Medicare Other | Attending: Emergency Medicine | Admitting: Emergency Medicine

## 2020-08-18 DIAGNOSIS — Z950 Presence of cardiac pacemaker: Secondary | ICD-10-CM | POA: Diagnosis not present

## 2020-08-18 DIAGNOSIS — R0602 Shortness of breath: Secondary | ICD-10-CM | POA: Diagnosis not present

## 2020-08-18 DIAGNOSIS — R1084 Generalized abdominal pain: Secondary | ICD-10-CM | POA: Diagnosis not present

## 2020-08-18 DIAGNOSIS — R0689 Other abnormalities of breathing: Secondary | ICD-10-CM | POA: Diagnosis not present

## 2020-08-18 DIAGNOSIS — Z79899 Other long term (current) drug therapy: Secondary | ICD-10-CM | POA: Diagnosis not present

## 2020-08-18 DIAGNOSIS — R140 Abdominal distension (gaseous): Secondary | ICD-10-CM | POA: Diagnosis not present

## 2020-08-18 DIAGNOSIS — Z7901 Long term (current) use of anticoagulants: Secondary | ICD-10-CM | POA: Insufficient documentation

## 2020-08-18 DIAGNOSIS — R0600 Dyspnea, unspecified: Secondary | ICD-10-CM | POA: Diagnosis not present

## 2020-08-18 DIAGNOSIS — J90 Pleural effusion, not elsewhere classified: Secondary | ICD-10-CM | POA: Diagnosis not present

## 2020-08-18 DIAGNOSIS — C7951 Secondary malignant neoplasm of bone: Secondary | ICD-10-CM | POA: Diagnosis not present

## 2020-08-18 DIAGNOSIS — J984 Other disorders of lung: Secondary | ICD-10-CM | POA: Diagnosis not present

## 2020-08-18 LAB — CBC WITH DIFFERENTIAL/PLATELET
Abs Immature Granulocytes: 0.02 10*3/uL (ref 0.00–0.07)
Basophils Absolute: 0 10*3/uL (ref 0.0–0.1)
Basophils Relative: 1 %
Eosinophils Absolute: 0.1 10*3/uL (ref 0.0–0.5)
Eosinophils Relative: 1 %
HCT: 41.9 % (ref 39.0–52.0)
Hemoglobin: 13.4 g/dL (ref 13.0–17.0)
Immature Granulocytes: 0 %
Lymphocytes Relative: 17 %
Lymphs Abs: 1 10*3/uL (ref 0.7–4.0)
MCH: 29.4 pg (ref 26.0–34.0)
MCHC: 32 g/dL (ref 30.0–36.0)
MCV: 91.9 fL (ref 80.0–100.0)
Monocytes Absolute: 0.7 10*3/uL (ref 0.1–1.0)
Monocytes Relative: 12 %
Neutro Abs: 4.2 10*3/uL (ref 1.7–7.7)
Neutrophils Relative %: 69 %
Platelets: 360 10*3/uL (ref 150–400)
RBC: 4.56 MIL/uL (ref 4.22–5.81)
RDW: 14.3 % (ref 11.5–15.5)
WBC: 6.1 10*3/uL (ref 4.0–10.5)
nRBC: 0 % (ref 0.0–0.2)

## 2020-08-18 LAB — COMPREHENSIVE METABOLIC PANEL
ALT: 49 U/L — ABNORMAL HIGH (ref 0–44)
AST: 29 U/L (ref 15–41)
Albumin: 3.1 g/dL — ABNORMAL LOW (ref 3.5–5.0)
Alkaline Phosphatase: 79 U/L (ref 38–126)
Anion gap: 11 (ref 5–15)
BUN: 14 mg/dL (ref 8–23)
CO2: 25 mmol/L (ref 22–32)
Calcium: 9.2 mg/dL (ref 8.9–10.3)
Chloride: 102 mmol/L (ref 98–111)
Creatinine, Ser: 0.87 mg/dL (ref 0.61–1.24)
GFR, Estimated: >60 mL/min (ref >60)
Glucose, Bld: 90 mg/dL (ref 70–99)
Potassium: 4 mmol/L (ref 3.5–5.1)
Sodium: 138 mmol/L (ref 135–145)
Total Bilirubin: 0.5 mg/dL (ref 0.3–1.2)
Total Protein: 6.6 g/dL (ref 6.5–8.1)

## 2020-08-18 LAB — I-STAT VENOUS BLOOD GAS, ED
Acid-Base Excess: 3 mmol/L — ABNORMAL HIGH (ref 0.0–2.0)
Bicarbonate: 27.6 mmol/L (ref 20.0–28.0)
Calcium, Ion: 1.22 mmol/L (ref 1.15–1.40)
HCT: 42 % (ref 39.0–52.0)
Hemoglobin: 14.3 g/dL (ref 13.0–17.0)
O2 Saturation: 69 %
Potassium: 4 mmol/L (ref 3.5–5.1)
Sodium: 138 mmol/L (ref 135–145)
TCO2: 29 mmol/L (ref 22–32)
pCO2, Ven: 41.4 mmHg — ABNORMAL LOW (ref 44.0–60.0)
pH, Ven: 7.433 — ABNORMAL HIGH (ref 7.250–7.430)
pO2, Ven: 35 mmHg (ref 32.0–45.0)

## 2020-08-18 MED ORDER — IOHEXOL 300 MG/ML  SOLN
75.0000 mL | Freq: Once | INTRAMUSCULAR | Status: AC | PRN
  Administered 2020-08-18: 75 mL via INTRAVENOUS

## 2020-08-18 NOTE — ED Notes (Signed)
DC instructions reviewed with pt and spouse.  Both verbalized understanding. PT DC.

## 2020-08-18 NOTE — ED Notes (Signed)
Patient transported to CT 

## 2020-08-18 NOTE — ED Triage Notes (Signed)
Pt c/o abdominal swelling on right side for approx. 3 days. Pt states it gets bigger and smaller over that time.

## 2020-08-18 NOTE — Discharge Instructions (Signed)
Return here at once if you develop fever, vomiting, abdominal pain.  Follow-up with your doctor for outpatient imaging.  New lesions were seen at T12 and L1 as we discussed.

## 2020-08-18 NOTE — ED Provider Notes (Signed)
Patient signed to me by Dr. Eulis Foster pending CT of the chest which shows no evidence of infection but does show some new sclerotic lesions at T12 and L1.  On exam, patient does have some right abdominal distention.  However it is only worse with standing.  He has no abdominal pain.  He has not had any fever or vomiting.  I did offer to do an abdominal CT this evening which the patient states he would like to do as an outpatient.  I will give strict return precautions.   Lacretia Leigh, MD 08/18/20 [facility]

## 2020-08-18 NOTE — ED Provider Notes (Signed)
[facility] EMERGENCY DEPARTMENT Provider Note   CSN: [id number] Arrival date & time: 08/18/20  1014     History Chief Complaint  Patient presents with  . abdominal swelling    Robert Kirby is a 69 y.o. male.  HPI Patient here with 2 complaints.  He has been short of breath with dyspnea on exertion for 2 weeks.  Also he has noticed an intermittent bulging of his right abdomen, for the last 3 days.  It seems to get better when he is supine and worse during the day after he has been up for a while.  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  He has pulmonary nodules, also being followed expectantly.  His primary doctors, neurology and oncology are at [facility] in [place], [place].  He was recently in the emergency department and saw his PCP for back pain and sciatica, treated with muscle relaxer, and narcotics.  He denies fever, chills, cough, nausea or vomiting.  He is taking his usual medications, without relief.  There are no other known modifying factors.    Past Medical History:  Diagnosis Date  . Addison's disease ([place])   . Antiphospholipid antibody syndrome ([place])   . Arrhythmia    Pacemaker  . Autoimmune encephalomyelitis   . Depression   . GERD (gastroesophageal reflux disease)   . History of blood transfusion    13 units from a GI Bleed.   Marland Kitchen History of GI bleed   . OSA on CPAP   . Sleep apnea    on CPAP  . 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 Seizure ([place]) 02/04/2020  . 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 Left-sided low back pain without sciatica 04/02/2019  . Dyspnea on exertion 04/02/2019  . Right inguinal hernia 03/31/2019  . Seasonal allergic rhinitis due to pollen 12/19/2017  . Depression   . OSA on CPAP   . Autoimmune encephalomyelitis   . Addison's disease ([place])   . Antiphospholipid  antibody syndrome ([place])   . Thymic carcinoma [facility])     Past Surgical History:  Procedure Laterality Date  . CHOLECYSTECTOMY  2000  . COLONOSCOPY  2016   was told to have one in 3 years. From [facility] [place] [facility] Dr. Amie Critchley  . ESOPHAGOGASTRODUODENOSCOPY     [place] different doctor least before 2014/15  . NOSE SURGERY     removed polyps from nose around 1996. Said he broke nose in 4 grade and needed surgery on it  . PACEMAKER PLACEMENT    . TONSILLECTOMY AND ADENOIDECTOMY  1959       Family History  Problem Relation Age of Onset  . Depression Mother   . High blood pressure Daughter   . Colon cancer Neg Hx   . Esophageal cancer Neg Hx     Social History   Tobacco Use  . Smoking status: Never Smoker  . Smokeless tobacco: Never Used  Vaping Use  . Vaping Use: Never used  Substance Use Topics  . Alcohol use: Yes    Comment: rare   . Drug use: No    Home Medications Prior to Admission medications   Medication Sig Start Date End Date Taking? Authorizing Provider  busPIRone (BUSPAR) 30 MG tablet Take 30 mg by mouth 2 (two) times daily.    [provider]  carbamazepine (TEGRETOL XR) 200 MG 12 hr tablet Take by mouth. 03/24/20   [provider]  carvedilol (COREG) 3.125 MG tablet Take 3.125 mg by mouth 2 (two) times daily. 03/03/20   [provider]  clonazePAM (KLONOPIN) 0.5 MG tablet Take 0.5 mg by mouth 2 (two) times daily.    [provider]  cyclobenzaprine (FLEXERIL) 10 MG tablet Take 0.5-1 tablets (5-10 mg total) by mouth 3 (three) times daily as needed for muscle spasms. 08/11/20   Shelda Pal, DO  DULoxetine (CYMBALTA) 60 MG capsule Take 120 mg by mouth daily.     [provider]  fluticasone (FLONASE) 50 MCG/ACT nasal spray SHAKE LIQUID AND USE 2 SPRAYS IN [facility] NOSTRIL DAILY 05/22/20   Wendling, Crosby Oyster, DO  furosemide (LASIX) 20 MG tablet Take 1 tablet by mouth daily. 02/19/19  02/19/20  [provider]  hydrocortisone (CORTEF) 10 MG tablet TAKE 2 TABLETS IN THE MORNING AND 1 TABLET IN THE EVENING FOR ADDISONS DISEASE 01/03/20   Shelda Pal, DO  lansoprazole (PREVACID) 15 MG capsule TAKE 1 CAPSULE DAILY 01/31/20   Wendling, Crosby Oyster, DO  levocetirizine (XYZAL) 5 MG tablet Take 1 tablet (5 mg total) by mouth every evening. 04/07/20   Wendling, Crosby Oyster, DO  LORazepam (ATIVAN) 1 MG tablet Take 0.5-1 tablets (0.5-1 mg total) by mouth every 8 (eight) hours as needed for anxiety. 10/15/19   Shelda Pal, DO  losartan (COZAAR) 25 MG tablet Take 25 mg by mouth daily.    [provider]  mirtazapine (REMERON) 45 MG tablet Take by mouth. 02/23/20 05/23/20  [provider]  riTUXimab (RITUXAN) 100 MG/10ML injection Inject into the vein every 6 (six) months.    [provider]  rosuvastatin (CRESTOR) 10 MG tablet Take 10 mg by mouth at bedtime. 04/25/20   [provider]  warfarin (COUMADIN) 5 MG tablet Take 1/2 tablet daily except 1  tablet  On Tuesday and Saturday  or  As directed 02/24/20   [provider]    Allergies    Other and Tetracyclines & related  Review of Systems   Review of Systems  All other systems reviewed and are negative.   Physical Exam Updated Vital Signs BP 109/87 (BP Location: Left Arm)   Pulse (!) 115   Temp 98.3 F (36.8 C) (Oral)   Resp (!) 23   SpO2 91%   Physical Exam Vitals and nursing note reviewed.  Constitutional:      General: He is not in acute distress.    Appearance: He is well-developed. He is not ill-appearing, toxic-appearing or diaphoretic.  HENT:     Head: Normocephalic and atraumatic.     Right Ear: External ear normal.     Left Ear: External ear normal.  Eyes:     Conjunctiva/sclera: Conjunctivae normal.     Pupils: Pupils are equal, round, and reactive to light.  Neck:     Trachea: Phonation normal.  Cardiovascular:     Rate and Rhythm:  Normal rate and regular rhythm.     Heart sounds: Normal heart sounds.  Pulmonary:     Effort: Pulmonary effort is normal. No respiratory distress.     Breath sounds: No stridor. Rhonchi (Right lower) present. No wheezing.  Abdominal:     General: There is no distension.     Palpations: Abdomen is soft. There is no mass.     Tenderness: There is no abdominal tenderness.  Hernia: No hernia is present.     Comments: Paradoxical movement of the abdomen with inspiration and expiration.  Musculoskeletal:        General: Normal range of motion.     Cervical back: Normal range of motion and neck supple.  Skin:    General: Skin is warm and dry.  Neurological:     Mental Status: He is alert and oriented to person, place, and time.     Cranial Nerves: No cranial nerve deficit.     Sensory: No sensory deficit.     Motor: No abnormal muscle tone.     Coordination: Coordination normal.  Psychiatric:        Mood and Affect: Mood normal.        Behavior: Behavior normal.        Thought Content: Thought content normal.        Judgment: Judgment normal.     ED Results / Procedures / Treatments   Labs (all labs ordered are listed, but only abnormal results are displayed) Labs Reviewed - No data to display  EKG None  Radiology No results found.  Procedures Procedures (including critical care time)  Medications Ordered in ED Medications - No data to display  ED Course  I have reviewed the triage vital signs and the nursing notes.  Pertinent labs & imaging results that were available during my care of the patient were reviewed by me and considered in my medical decision making (see chart for details).    MDM Rules/Calculators/A&P                           Patient Vitals for the past 24 hrs:  BP Temp Temp src Pulse Resp SpO2  08/18/20 1200 127/89 -- -- (!) 103 -- 94 %  08/18/20 1145 131/88 -- -- (!) 104 -- 94 %  08/18/20 1130 133/90 -- -- (!) 105 -- 95 %  08/18/20 1026  109/87 98.3 F (36.8 C) Oral (!) 115 (!) 23 91 %    Medical Decision Making:  This patient is presenting for evaluation of abdominal discomfort, which does require a range of treatment options, and is a complaint that involves a moderate risk of morbidity and mortality. The differential diagnoses include nonspecific abdominal discomfort, worsening chronic illness including thymic carcinoma. I decided to review old records, and in summary middle-aged male presenting for evaluation of "bulge of abdomen," with a benign abdominal examination, and abnormal lung exam  I obtained additional historical information from wife at bedside.  Clinical Laboratory Tests Ordered, included CBC and Metabolic panel. Review indicates normal findings. Radiologic Tests Ordered, included CT chest.     Critical Interventions-clinical evaluation, laboratory testing, CT imaging, observation  CRITICAL CARE-no Performed by: Daleen Bo  Nursing Notes Reviewed/ Care Coordinated Applicable Imaging Reviewed Interpretation of Laboratory Data incorporated into ED treatment     Final Clinical Impression(s) / ED Diagnoses Final diagnoses:  Abdominal discomfort    Rx / DC Orders ED Discharge Orders    None       Daleen Bo, MD 08/18/20 1732

## 2020-08-19 ENCOUNTER — Telehealth: Payer: Self-pay

## 2020-08-19 NOTE — Telephone Encounter (Signed)
Patient called health team triage line stating he needed an appointment for a lump , I tried to call pt today to set an appointment , but left a voicemail

## 2020-08-20 DIAGNOSIS — Z7901 Long term (current) use of anticoagulants: Secondary | ICD-10-CM | POA: Diagnosis not present

## 2020-08-21 NOTE — Telephone Encounter (Signed)
Sched tomorrow for ED f/u plz. Ty.

## 2020-08-21 NOTE — Telephone Encounter (Signed)
Called the patient and he is scheduled at [facility] tomorrow for tests and will need to call our office back to schedule ED Follow-up when he better knows how available he will be.

## 2020-08-21 NOTE — Telephone Encounter (Signed)
Called the patient and he went to the ED on Friday 08/18/2020 See ED notes/imaging per ED

## 2020-08-24 DIAGNOSIS — F331 Major depressive disorder, recurrent, moderate: Secondary | ICD-10-CM | POA: Diagnosis not present

## 2020-08-29 DIAGNOSIS — I447 Left bundle-branch block, unspecified: Secondary | ICD-10-CM | POA: Diagnosis not present

## 2020-08-29 DIAGNOSIS — C7951 Secondary malignant neoplasm of bone: Secondary | ICD-10-CM | POA: Diagnosis not present

## 2020-08-29 DIAGNOSIS — M5440 Lumbago with sciatica, unspecified side: Secondary | ICD-10-CM | POA: Diagnosis not present

## 2020-08-29 DIAGNOSIS — G9589 Other specified diseases of spinal cord: Secondary | ICD-10-CM | POA: Diagnosis not present

## 2020-08-29 DIAGNOSIS — F32A Depression, unspecified: Secondary | ICD-10-CM | POA: Diagnosis not present

## 2020-08-29 DIAGNOSIS — G0481 Other encephalitis and encephalomyelitis: Secondary | ICD-10-CM | POA: Diagnosis not present

## 2020-08-29 DIAGNOSIS — I420 Dilated cardiomyopathy: Secondary | ICD-10-CM | POA: Diagnosis not present

## 2020-08-29 DIAGNOSIS — F419 Anxiety disorder, unspecified: Secondary | ICD-10-CM | POA: Diagnosis not present

## 2020-08-29 DIAGNOSIS — M549 Dorsalgia, unspecified: Secondary | ICD-10-CM | POA: Diagnosis not present

## 2020-08-29 DIAGNOSIS — I442 Atrioventricular block, complete: Secondary | ICD-10-CM | POA: Diagnosis not present

## 2020-08-29 DIAGNOSIS — Z45018 Encounter for adjustment and management of other part of cardiac pacemaker: Secondary | ICD-10-CM | POA: Diagnosis not present

## 2020-08-29 DIAGNOSIS — Z950 Presence of cardiac pacemaker: Secondary | ICD-10-CM | POA: Diagnosis not present

## 2020-08-29 DIAGNOSIS — D6861 Antiphospholipid syndrome: Secondary | ICD-10-CM | POA: Diagnosis not present

## 2020-08-29 DIAGNOSIS — R918 Other nonspecific abnormal finding of lung field: Secondary | ICD-10-CM | POA: Diagnosis not present

## 2020-08-29 DIAGNOSIS — Z923 Personal history of irradiation: Secondary | ICD-10-CM | POA: Diagnosis not present

## 2020-08-29 DIAGNOSIS — J929 Pleural plaque without asbestos: Secondary | ICD-10-CM | POA: Diagnosis not present

## 2020-08-29 DIAGNOSIS — C37 Malignant neoplasm of thymus: Secondary | ICD-10-CM | POA: Diagnosis not present

## 2020-08-29 DIAGNOSIS — R569 Unspecified convulsions: Secondary | ICD-10-CM | POA: Diagnosis not present

## 2020-08-29 DIAGNOSIS — E2740 Unspecified adrenocortical insufficiency: Secondary | ICD-10-CM | POA: Diagnosis not present

## 2020-08-29 DIAGNOSIS — R0602 Shortness of breath: Secondary | ICD-10-CM | POA: Diagnosis not present

## 2020-08-29 DIAGNOSIS — M792 Neuralgia and neuritis, unspecified: Secondary | ICD-10-CM | POA: Diagnosis not present

## 2020-08-29 DIAGNOSIS — Z9221 Personal history of antineoplastic chemotherapy: Secondary | ICD-10-CM | POA: Diagnosis not present

## 2020-08-29 DIAGNOSIS — R200 Anesthesia of skin: Secondary | ICD-10-CM | POA: Diagnosis not present

## 2020-08-29 DIAGNOSIS — M7989 Other specified soft tissue disorders: Secondary | ICD-10-CM | POA: Diagnosis not present

## 2020-09-04 ENCOUNTER — Other Ambulatory Visit: Payer: Self-pay

## 2020-09-04 ENCOUNTER — Ambulatory Visit (INDEPENDENT_AMBULATORY_CARE_PROVIDER_SITE_OTHER): Payer: Medicare Other | Admitting: Family Medicine

## 2020-09-04 ENCOUNTER — Encounter: Payer: Self-pay | Admitting: Family Medicine

## 2020-09-04 VITALS — BP 128/80 | HR 112 | Temp 97.7°F | Ht 72.0 in | Wt 208.0 lb

## 2020-09-04 DIAGNOSIS — M2550 Pain in unspecified joint: Secondary | ICD-10-CM

## 2020-09-04 LAB — SEDIMENTATION RATE: Sed Rate: 8 mm/hr (ref 0–20)

## 2020-09-04 MED ORDER — TRAMADOL HCL 50 MG PO TABS: 50.0000 mg | ORAL_TABLET | Freq: Two times a day (BID) | ORAL | 0 refills | Status: DC | PRN

## 2020-09-04 NOTE — Progress Notes (Signed)
No chief complaint on file.   Subjective: Patient is a 69 y.o. male here for jt pains.  Elbows, knees, ankles and wrists bothersome, particularly when it is damp and cold. This is getting worse by the year. Due to his encephalitis, he cannot remember exactly when this started, but notes it has been several years. He gets associated fatigue. He takes Tylenol that helps a little. He is on warfarin so does not take anti-inflammatories. No famhx of AI issues. He does have anti-phospholipid ab syndrome.  Past Medical History:  Diagnosis Date  . Addison's disease ([place])   . Antiphospholipid antibody syndrome ([place])   . Arrhythmia    Pacemaker  . Autoimmune encephalomyelitis   . Depression   . GERD (gastroesophageal reflux disease)   . History of blood transfusion    13 units from a GI Bleed.   Marland Kitchen History of GI bleed   . OSA on CPAP   . Sleep apnea    on CPAP  . 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  Neuro: Gait is normal Lungs: No accessory muscle use Psych:  normal affect and mood  Assessment and Plan: Polyarthralgia - Plan: traMADol (ULTRAM) 50 MG tablet, ANA w/Reflex, Anti-Smith antibody, Anti-DNA antibody, double-stranded, Sjogrens syndrome-A extractable nuclear antibody, Sjogrens syndrome-B extractable nuclear antibody, Sedimentation rate, Aldolase  OK with sparing use of tramadol as he is on Cymbalta. Ck AI labs. Foods that may help were listed on his paperwork.  F/u as originally scheduled.  The patient voiced understanding and agreement to the plan.  [place], DO 09/04/20  1:55 PM

## 2020-09-04 NOTE — Patient Instructions (Signed)
Give [place] 5-6 business days to get the results of your labs back.   Do not drink alcohol, do any illicit/street drugs, drive or do anything that requires alertness while on the tramadol.   Foods that may reduce pain: 1) Ginger 2) Blueberries 3) Salmon 4) Pumpkin seeds 5) dark chocolate 6) turmeric 7) tart cherries 8) virgin olive oil 9) chilli peppers 10) mint 11) red wine  Let us know if you need anything.

## 2020-09-05 LAB — SJOGRENS SYNDROME-A EXTRACTABLE NUCLEAR ANTIBODY: SSA (Ro) (ENA) Antibody, IgG: <1 AI

## 2020-09-05 LAB — ANTI-SMITH ANTIBODY: ENA SM Ab Ser-aCnc: <1 AI

## 2020-09-05 LAB — ANTI-DNA ANTIBODY, DOUBLE-STRANDED: ds DNA Ab: 2 IU/mL

## 2020-09-05 LAB — SJOGRENS SYNDROME-B EXTRACTABLE NUCLEAR ANTIBODY: SSB (La) (ENA) Antibody, IgG: <1 AI

## 2020-09-05 LAB — ALDOLASE: Aldolase: 3.8 U/L (ref <=8.1)

## 2020-09-07 LAB — ANA W/REFLEX: ANA Titer 1: NEGATIVE

## 2020-09-27 ENCOUNTER — Ambulatory Visit: Payer: Medicare Other | Admitting: Internal Medicine

## 2020-10-02 DIAGNOSIS — C37 Malignant neoplasm of thymus: Secondary | ICD-10-CM | POA: Diagnosis not present

## 2020-10-04 ENCOUNTER — Other Ambulatory Visit: Payer: Self-pay | Admitting: Family Medicine

## 2020-10-04 MED ORDER — HYDROCORTISONE 10 MG PO TABS: ORAL_TABLET | ORAL | 3 refills | Status: DC

## 2020-10-04 MED ORDER — LANSOPRAZOLE 15 MG PO CPDR: 15.0000 mg | DELAYED_RELEASE_CAPSULE | Freq: Every day | ORAL | 3 refills | Status: DC

## 2020-10-10 DIAGNOSIS — C37 Malignant neoplasm of thymus: Secondary | ICD-10-CM | POA: Diagnosis not present

## 2020-10-11 ENCOUNTER — Telehealth: Payer: Self-pay

## 2020-10-11 ENCOUNTER — Inpatient Hospital Stay: Payer: Medicare Other

## 2020-10-11 ENCOUNTER — Inpatient Hospital Stay: Payer: Medicare Other | Admitting: Hematology & Oncology

## 2020-10-11 DIAGNOSIS — C37 Malignant neoplasm of thymus: Secondary | ICD-10-CM | POA: Diagnosis not present

## 2020-10-11 NOTE — Telephone Encounter (Signed)
Called pt to r/s his missed 1-19 appts and he states that he is at [facility] starting radiation and will call back to r/s    aom

## 2020-10-12 DIAGNOSIS — C37 Malignant neoplasm of thymus: Secondary | ICD-10-CM | POA: Diagnosis not present

## 2020-10-13 DIAGNOSIS — C37 Malignant neoplasm of thymus: Secondary | ICD-10-CM | POA: Diagnosis not present

## 2020-10-16 DIAGNOSIS — C37 Malignant neoplasm of thymus: Secondary | ICD-10-CM | POA: Diagnosis not present

## 2020-10-17 DIAGNOSIS — C37 Malignant neoplasm of thymus: Secondary | ICD-10-CM | POA: Diagnosis not present

## 2020-10-18 DIAGNOSIS — C37 Malignant neoplasm of thymus: Secondary | ICD-10-CM | POA: Diagnosis not present

## 2020-10-18 DIAGNOSIS — Z7901 Long term (current) use of anticoagulants: Secondary | ICD-10-CM | POA: Diagnosis not present

## 2020-10-19 DIAGNOSIS — Z950 Presence of cardiac pacemaker: Secondary | ICD-10-CM | POA: Diagnosis not present

## 2020-10-19 DIAGNOSIS — C37 Malignant neoplasm of thymus: Secondary | ICD-10-CM | POA: Diagnosis not present

## 2020-10-20 DIAGNOSIS — C37 Malignant neoplasm of thymus: Secondary | ICD-10-CM | POA: Diagnosis not present

## 2020-10-23 DIAGNOSIS — C37 Malignant neoplasm of thymus: Secondary | ICD-10-CM | POA: Diagnosis not present

## 2020-10-24 DIAGNOSIS — C37 Malignant neoplasm of thymus: Secondary | ICD-10-CM | POA: Diagnosis not present

## 2020-10-25 ENCOUNTER — Telehealth: Payer: Self-pay

## 2020-10-25 NOTE — Telephone Encounter (Signed)
Pt  Called in to r/s his missed 10/11/20 appts   Robert Kirby

## 2020-10-26 DIAGNOSIS — G3184 Mild cognitive impairment, so stated: Secondary | ICD-10-CM | POA: Diagnosis not present

## 2020-10-26 DIAGNOSIS — F330 Major depressive disorder, recurrent, mild: Secondary | ICD-10-CM | POA: Diagnosis not present

## 2020-10-26 DIAGNOSIS — F411 Generalized anxiety disorder: Secondary | ICD-10-CM | POA: Diagnosis not present

## 2020-11-01 ENCOUNTER — Telehealth: Payer: Self-pay

## 2020-11-01 NOTE — Telephone Encounter (Signed)
Pt called in and r/s his 11/16/20 appts as he will be getting his tx at [facility] on 2/23 and wanted to push his appt out two months      Robert Kirby

## 2020-11-07 DIAGNOSIS — R590 Localized enlarged lymph nodes: Secondary | ICD-10-CM | POA: Diagnosis not present

## 2020-11-07 DIAGNOSIS — Z79899 Other long term (current) drug therapy: Secondary | ICD-10-CM | POA: Diagnosis not present

## 2020-11-07 DIAGNOSIS — G9589 Other specified diseases of spinal cord: Secondary | ICD-10-CM | POA: Diagnosis not present

## 2020-11-07 DIAGNOSIS — Z923 Personal history of irradiation: Secondary | ICD-10-CM | POA: Diagnosis not present

## 2020-11-07 DIAGNOSIS — C37 Malignant neoplasm of thymus: Secondary | ICD-10-CM | POA: Diagnosis not present

## 2020-11-07 DIAGNOSIS — M549 Dorsalgia, unspecified: Secondary | ICD-10-CM | POA: Diagnosis not present

## 2020-11-07 DIAGNOSIS — R918 Other nonspecific abnormal finding of lung field: Secondary | ICD-10-CM | POA: Diagnosis not present

## 2020-11-07 DIAGNOSIS — C7951 Secondary malignant neoplasm of bone: Secondary | ICD-10-CM | POA: Diagnosis not present

## 2020-11-07 DIAGNOSIS — K769 Liver disease, unspecified: Secondary | ICD-10-CM | POA: Diagnosis not present

## 2020-11-13 ENCOUNTER — Telehealth: Payer: Self-pay

## 2020-11-13 NOTE — Telephone Encounter (Signed)
S/w pt per 11/13/20 sch message     Robert Kirby

## 2020-11-15 DIAGNOSIS — G0481 Other encephalitis and encephalomyelitis: Secondary | ICD-10-CM | POA: Diagnosis not present

## 2020-11-16 ENCOUNTER — Other Ambulatory Visit: Payer: Medicare Other

## 2020-11-16 ENCOUNTER — Ambulatory Visit: Payer: Medicare Other | Admitting: Hematology & Oncology

## 2020-11-22 NOTE — Progress Notes (Signed)
M married, Company secretary, never smoker. Previously followed by Dr [place] Bells in 2020- Synopsis: Referred to [facility] in 09/2018 fordyspnea.  He has a complex medical history which includes obstructive sleep apnea on CPAP, a diagnosis  of thymic carcinoma treated with chemo therapy and radiation in 2015.  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  This was treated with prednisone.  He has followed at [facility] and lives in [place]. 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 Pacemaker for complete heart block Seasonal Allergic Rhinitis, Paralyzed hemidiaphragm. Original sleep study in [place] with new CPAP about 2018. Unknown settings.  REM Behavior Disorder dx'd at [facility] 02/26/20- AHI 35.1/ hr, desat to 79%, CPAP rec auto 5-15, body weight 240 lbs Covid infection Oct 2021 --------------------------------------------------------------------------------------------------------------------   08/04/20- 69 yoMnever smoker followed for OSA, RBD, complicated by  thymic carcinoma treated with chemo therapy and radiation in 2015., Autoimmune Encephalitis with Seizure treated with plasmapheresis and rituximab.  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  This was treated with prednisone.  He has followed at [facility] and lives in [place]. 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 Pacemaker for complete heart block Seasonal Allergic Rhinitis, Paralyzed hemidiaphragm CPAP auto 13/ [employer],    O2 [employer] 2L (here on room air) Download-compliance 40%, AHI 1.9/ hr.  Body Weight today- 214 lbs Covid vax- 3 Phizer                                                Wife here Flu vax- had Using clonazepam 1 mg and mirtazapine for sleep, now trying melatonin 13 mg per [facility] for RBD. ------Had Covid end of Sept, 2021. chronic cough, wheezing, pt still has sob on exertion .  He and wife both triple vax, but both had Covid, relatively mild, Rx'd MAB. He still has some cough, scant phlegm white. Still easily tired.  Using CPAP less during this. Bothered by airflow from mask vents. Had mask-fitting last month at [facility].  He usess either O2 or CPAP for sleep- discussed.  Known weak diaphragm. Tends to run mild tachycardia.   11/23/20- 69 yoM never smoker followed for OSA, RBD, complicated by  thymic carcinoma treated with chemo therapy and radiation in 2015., Autoimmune Encephalitis with Seizure treated with plasmapheresis and rituximab.  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  This was treated with prednisone.  He has followed at [facility] and lives in [place]. 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 Pacemaker for complete heart block Seasonal Allergic Rhinitis, Paralyzed hemidiaphragm. Covid infection Oct 2021,  CPAP auto 13/ [employer],    O2 [employer] 2L (here on room air) Download- compliance 33%, - last was Feb 11. AHI 1.9/ hr Body weight today- Covid vax-3 Phizer                  Wife here Flu vax- had [facility] follows for widely metastatic disease -recurrent thymic cancer -----Patient feels that breathing  is the same. Covid in October, when he bends over he huffs and puffs for air. Does fine with walking or exertion. Jan he finished 10 radiation treatments and meets with Oncology tomorrow for Chemo treatments.   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 Had spot XRT to spine.  He has notices bulging right lateral abdominal wall, which he shows me. There is  a prominent superficial varix over the area. I suspect the XRT or the spinal tumor has affected nerves causing muscle weakness to that area. He notes some numbness medially. Dyspnea bending over reflects diaphragm weakness. He has home O2 concentrator, not used much and asks if he needs it.  Waking with gastric air bubble, so he is swallowing some arir at current CPAP pressure.  Has been leaving CPAP off more.  ROS-see HPI   + = positive Constitutional:   + weight loss, night sweats, fevers, chills, +fatigue, lassitude. HEENT:    headaches, difficulty swallowing, tooth/dental problems, sore throat,       sneezing, itching, ear ache, +nasal congestion, post nasal drip, snoring CV:    chest pain, orthopnea, PND, swelling in lower extremities, anasarca,                                   dizziness, palpitations Resp:   +shortness of breath with exertion or at rest.                productive cough,   non-productive cough, coughing up of blood.              change in color of mucus.  wheezing.   Skin:    rash or lesions. GI:  No-   heartburn, indigestion, abdominal pain, nausea, vomiting, diarrhea,                 change in bowel habits, loss of appetite GU: dysuria, change in color of urine, no urgency or frequency.   flank pain. MS:   +joint pain, stiffness, decreased range of motion, back pain. Neuro-     + muscle relaxation R abdominal walal Psych:  change in mood or affect.  +depression or +anxiety.   +memory loss.  OBJ- Physical Exam General- Alert, Oriented, Affect-appropriate, Distress- none acute,  Skin- rash-none, lesions- none, excoriation- none Lymphadenopathy- none Head- atraumatic            Eyes- Gross vision intact, PERRLA, conjunctivae and secretions clear            Ears- +Hearing aid            Nose- Clear, no-Septal dev, mucus, polyps, erosion, perforation             Throat- Mallampati II-III , mucosa clear , drainage- none, tonsils- atrophic, + teeth Neck- flexible ,  trachea midline, no stridor , thyroid nl, carotid no bruit Chest - symmetrical excursion , unlabored           Heart/CV- RRR/ 112 , no murmur , no gallop  , no rub, nl s1 s2                           - JVD- none , edema- none, stasis changes- none, varices- none           Lung- clear to P&A, wheeze- none, cough- none , dullness+ R base, rub- none  Chest wall-  + L pacemaker Abd-  + muscle relaxation R lateral abdominal wall with overlying superficial varix. I can't tell that liver is enlarged,  Br/ Gen/ Rectal- Not done, not indicated Extrem- cyanosis- none, clubbing, none, atrophy- none, strength- nl Neuro- grossly intact to observation

## 2020-11-23 ENCOUNTER — Other Ambulatory Visit: Payer: Self-pay

## 2020-11-23 ENCOUNTER — Ambulatory Visit (INDEPENDENT_AMBULATORY_CARE_PROVIDER_SITE_OTHER): Payer: Medicare Other | Admitting: Internal Medicine

## 2020-11-23 ENCOUNTER — Encounter: Payer: Self-pay | Admitting: Internal Medicine

## 2020-11-23 VITALS — BP 110/80 | HR 98 | Temp 97.6°F | Ht 72.0 in | Wt 209.2 lb

## 2020-11-23 DIAGNOSIS — G4733 Obstructive sleep apnea (adult) (pediatric): Secondary | ICD-10-CM | POA: Diagnosis not present

## 2020-11-23 DIAGNOSIS — R0609 Other forms of dyspnea: Secondary | ICD-10-CM

## 2020-11-23 DIAGNOSIS — Z9989 Dependence on other enabling machines and devices: Secondary | ICD-10-CM | POA: Diagnosis not present

## 2020-11-23 DIAGNOSIS — R0600 Dyspnea, unspecified: Secondary | ICD-10-CM

## 2020-11-23 NOTE — Patient Instructions (Signed)
Order- DME [employer]- overnight oximetry on CPAP with room air    Dx nocturnal hypoxemia  Try to re-establish the habit of wearing CPAP every night. I think you will be better off.   Please call as needed

## 2020-11-24 ENCOUNTER — Inpatient Hospital Stay: Payer: Medicare Other | Attending: Hematology & Oncology

## 2020-11-24 ENCOUNTER — Inpatient Hospital Stay (HOSPITAL_BASED_OUTPATIENT_CLINIC_OR_DEPARTMENT_OTHER): Payer: Medicare Other | Admitting: Hematology & Oncology

## 2020-11-24 ENCOUNTER — Inpatient Hospital Stay: Payer: Medicare Other

## 2020-11-24 ENCOUNTER — Encounter: Payer: Self-pay | Admitting: Hematology & Oncology

## 2020-11-24 DIAGNOSIS — C37 Malignant neoplasm of thymus: Secondary | ICD-10-CM

## 2020-11-24 DIAGNOSIS — Z79899 Other long term (current) drug therapy: Secondary | ICD-10-CM | POA: Diagnosis not present

## 2020-11-24 DIAGNOSIS — Z7901 Long term (current) use of anticoagulants: Secondary | ICD-10-CM | POA: Diagnosis not present

## 2020-11-24 DIAGNOSIS — G0481 Other encephalitis and encephalomyelitis: Secondary | ICD-10-CM

## 2020-11-24 DIAGNOSIS — Z923 Personal history of irradiation: Secondary | ICD-10-CM | POA: Diagnosis not present

## 2020-11-24 DIAGNOSIS — Z5111 Encounter for antineoplastic chemotherapy: Secondary | ICD-10-CM | POA: Diagnosis not present

## 2020-11-24 DIAGNOSIS — C7951 Secondary malignant neoplasm of bone: Secondary | ICD-10-CM | POA: Insufficient documentation

## 2020-11-24 DIAGNOSIS — D6861 Antiphospholipid syndrome: Secondary | ICD-10-CM | POA: Insufficient documentation

## 2020-11-24 DIAGNOSIS — Z9221 Personal history of antineoplastic chemotherapy: Secondary | ICD-10-CM | POA: Diagnosis not present

## 2020-11-24 DIAGNOSIS — Z7189 Other specified counseling: Secondary | ICD-10-CM

## 2020-11-24 DIAGNOSIS — Z95828 Presence of other vascular implants and grafts: Secondary | ICD-10-CM

## 2020-11-24 HISTORY — DX: Other specified counseling: Z71.89

## 2020-11-24 LAB — CBC WITH DIFFERENTIAL (CANCER CENTER ONLY)
Abs Immature Granulocytes: 0.03 10*3/uL (ref 0.00–0.07)
Basophils Absolute: 0 10*3/uL (ref 0.0–0.1)
Basophils Relative: 1 %
Eosinophils Absolute: 0.1 10*3/uL (ref 0.0–0.5)
Eosinophils Relative: 1 %
HCT: 44 % (ref 39.0–52.0)
Hemoglobin: 14.6 g/dL (ref 13.0–17.0)
Immature Granulocytes: 1 %
Lymphocytes Relative: 11 %
Lymphs Abs: 0.7 10*3/uL (ref 0.7–4.0)
MCH: 30.4 pg (ref 26.0–34.0)
MCHC: 33.2 g/dL (ref 30.0–36.0)
MCV: 91.5 fL (ref 80.0–100.0)
Monocytes Absolute: 0.7 10*3/uL (ref 0.1–1.0)
Monocytes Relative: 10 %
Neutro Abs: 5.1 10*3/uL (ref 1.7–7.7)
Neutrophils Relative %: 76 %
Platelet Count: 229 10*3/uL (ref 150–400)
RBC: 4.81 MIL/uL (ref 4.22–5.81)
RDW: 14.7 % (ref 11.5–15.5)
WBC Count: 6.6 10*3/uL (ref 4.0–10.5)
nRBC: 0 % (ref 0.0–0.2)

## 2020-11-24 LAB — CMP (CANCER CENTER ONLY)
ALT: 27 U/L (ref 0–44)
AST: 29 U/L (ref 15–41)
Albumin: 4.3 g/dL (ref 3.5–5.0)
Alkaline Phosphatase: 72 U/L (ref 38–126)
Anion gap: 7 (ref 5–15)
BUN: 17 mg/dL (ref 8–23)
CO2: 31 mmol/L (ref 22–32)
Calcium: 9.7 mg/dL (ref 8.9–10.3)
Chloride: 101 mmol/L (ref 98–111)
Creatinine: 1.13 mg/dL (ref 0.61–1.24)
GFR, Estimated: >60 mL/min (ref >60)
Glucose, Bld: 99 mg/dL (ref 70–99)
Potassium: 4.4 mmol/L (ref 3.5–5.1)
Sodium: 139 mmol/L (ref 135–145)
Total Bilirubin: 0.4 mg/dL (ref 0.3–1.2)
Total Protein: 6.8 g/dL (ref 6.5–8.1)

## 2020-11-24 LAB — LACTATE DEHYDROGENASE: LDH: 176 U/L (ref 98–192)

## 2020-11-24 LAB — PROTIME-INR
INR: 2.3 — ABNORMAL HIGH (ref 0.8–1.2)
Prothrombin Time: 24.2 seconds — ABNORMAL HIGH (ref 11.4–15.2)

## 2020-11-24 MED ORDER — SODIUM CHLORIDE 0.9% FLUSH
10.0000 mL | Freq: Once | INTRAVENOUS | Status: AC
  Administered 2020-11-24: 10 mL via INTRAVENOUS
  Filled 2020-11-24: qty 10

## 2020-11-24 MED ORDER — HEPARIN SOD (PORK) LOCK FLUSH 100 UNIT/ML IV SOLN
500.0000 [IU] | Freq: Once | INTRAVENOUS | Status: AC
  Administered 2020-11-24: 500 [IU] via INTRAVENOUS
  Filled 2020-11-24: qty 5

## 2020-11-24 NOTE — Progress Notes (Signed)
START OFF PATHWAY REGIMEN - Other   [id number]:Gemcitabine 1,000 mg/m2 D1, 8  q21 Days:   A cycle is every 21 days:     Gemcitabine   **Always confirm dose/schedule in your pharmacy ordering system**  Patient Characteristics: Intent of Therapy: Non-Curative / Palliative Intent, Discussed with Patient

## 2020-11-24 NOTE — Patient Instructions (Signed)
Tunneled Central Venous Catheter Flushing Guide  It is important to flush your tunneled central venous catheter each time you use it, both before and after you use it. Flushing your catheter will help prevent it from clogging. What are the risks? Risks may include:  Infection.  Air getting into the catheter and bloodstream. Supplies needed:  A clean pair of gloves.  A disinfecting wipe. Use an alcohol wipe, chlorhexidine wipe, or iodine wipe as told by your health care provider.  A 10 mL syringe that has been prefilled with saline solution.  An empty 10 mL syringe, if a substance called heparin was injected into your catheter. How to flush your catheter When you flush your catheter, make sure you follow any specific instructions from your health care provider or the manufacturer. These are general guidelines. Flushing your catheter before use If there is heparin in your catheter: 1. Wash your hands with soap and water. 2. Put on gloves. 3. Scrub the injection cap for a minimum of 15 seconds with a disinfecting wipe. 4. Unclamp the catheter. 5. Attach the empty syringe to the injection cap. 6. Pull the syringe plunger back and withdraw 10 mL of blood. 7. Place the syringe into an appropriate waste container. 8. Scrub the injection cap for 15 seconds with a disinfecting wipe. 9. Attach the prefilled syringe to the injection cap. 10. Flush the catheter by pushing the plunger forward until all the liquid from the syringe is in the catheter. 11. Remove the syringe from the injection cap. 12. Clamp the catheter. If there is no heparin in your catheter: 1. Wash your hands with soap and water. 2. Put on gloves. 3. Scrub the injection cap for 15 seconds with a disinfecting wipe. 4. Unclamp the catheter. 5. Attach the prefilled syringe to the injection cap. 6. Flush the catheter by pushing the plunger forward until 5 mL of the liquid from the syringe is in the catheter. 7. Pull back on  the syringe until you see blood in the catheter. 8. If you have been asked to collect any blood, follow your health care provider's instructions. Otherwise, flush the catheter with the rest of the solution from the syringe. 9. Remove the syringe from the injection cap. 10. Clamp the catheter.   Flushing your catheter after use 1. Wash your hands with soap and water. 2. Put on gloves. 3. Scrub the injection cap for 15 seconds with a disinfecting wipe. 4. Unclamp the catheter. 5. Attach the prefilled syringe to the injection cap. 6. Flush the catheter by pushing the plunger forward until all of the liquid from the syringe is in the catheter. 7. Remove the syringe from the injection cap. 8. Clamp the catheter. Problems and solutions  If blood cannot be completely cleared from the injection cap, you may need to have the injection cap replaced.  If the catheter is difficult to flush, use the pulsing method. The pulsing method involves pushing only a few milliliters of solution into the catheter at a time and pausing between pushes.  If you do not see blood in the catheter when you pull back on the syringe, change your body position, such as by raising your arms above your head. Take a deep breath and cough. Then, pull back on the syringe. If you still do not see blood, flush the catheter with a small amount of solution. Then, change positions again and take a breath or cough. Pull back on the syringe again. If you still do not   see blood, finish flushing the catheter and contact your health care provider. Do not use your catheter until your health care provider says it is okay. General tips  Have someone help you flush your catheter, if possible.  Do not force fluid through your catheter.  Do not use a syringe that is larger or smaller than 10 mL. Using a smaller syringe can make the catheter burst.  Do not use your catheter without flushing it first if it has heparin in it. Contact a health  care provider if:  You cannot see any blood in the catheter when you flush it before using it.  Your catheter is difficult to flush. Get help right away if:  You cannot flush the catheter.  The catheter leaks when you flush it or when there is fluid in it.  There are cracks or breaks in the catheter. Summary  It is important to flush your tunneled central venous catheter each time you use it, both before and after you use it.  Scrub the injection cap for 15 seconds with a disinfecting wipe before and after you flush it.  When you flush your catheter, make sure you follow any specific instructions from your health care provider or the manufacturer.  Get help right away if you cannot flush the catheter. This information is not intended to replace advice given to you by your health care provider. Make sure you discuss any questions you have with your health care provider. Document Revised: 11/18/2019 Document Reviewed: 11/25/2018 Elsevier Patient Education  2021 Elsevier Inc.  

## 2020-11-24 NOTE — Progress Notes (Signed)
Hematology and Oncology Follow Up Visit  Robert Kirby [id number] 05/06/1951 70 y.o. 11/24/2020   Principle Diagnosis:   Metastatic Thymic Cancer ([id number]) -- recurrent  Auto-immune limbic encephalopathy  Anti-cardiolipin Ab Syndrome  Current Therapy:    Gemzar 1000 mg/m2 q week (2 week on/ 1 week off)  -- start on 11/29/2020  Xgeva 120 mg sq every 3 months  -start on 11/29/2020  Rituxan -- given at [facility] -- q 6 months  Coumadin to maintain INR 2.5-3     Interim History:  Mr. Robert Kirby is back for a visit.  We have not seen him for quite a while.  He had been doing quite well until he was found to have recurrence of his thymic carcinoma.  This was found at [facility].  He has had radiation therapy for spinal disease.  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  Because he lives close by, he wished to have therapy close to home.  I spoke with Dr. Marcie Kirby at [facility].  We decided that weekly Gemzar would be a good idea.  Mr. Robert Kirby does look quite good.  He feels good.  He is still getting Rituxan at [facility] because of this autoimmune limbic encephalopathy that he has.  He is on Coumadin because of anticardiolipin antibody syndrome.  He has had no problems with cough or shortness of breath.  There has been no pain.  He has had no nausea or vomiting.  He has had no change in bowel or bladder habits.  There is been no rashes.  He has had no leg swelling.  Overall, I would say his performance status is probably ECOG 0.  Medications:  Current Outpatient Medications:  .  busPIRone (BUSPAR) 30 MG tablet, Take 30 mg by mouth 2 (two) times daily., Disp: , Rfl:  .  carbamazepine (TEGRETOL XR) 200 MG 12 hr tablet, Take by mouth., Disp: , Rfl:  .  carvedilol (COREG) 3.125 MG tablet, Take 3.125 mg by mouth 2 (two) times daily., Disp: , Rfl:  .  clonazePAM (KLONOPIN) 0.5 MG tablet, Take 0.5 mg by mouth 2 (two) times daily., Disp: , Rfl:  .  DULoxetine (CYMBALTA) 60 MG capsule, Take 120 mg by  mouth daily. , Disp: , Rfl:  .  fluticasone (FLONASE) 50 MCG/ACT nasal spray, SHAKE LIQUID AND USE 2 SPRAYS IN EACH NOSTRIL DAILY, Disp: 48 g, Rfl: 2 .  furosemide (LASIX) 20 MG tablet, Take 1 tablet by mouth daily., Disp: , Rfl:  .  hydrocortisone (CORTEF) 10 MG tablet, TAKE 2 TABLETS IN THE MORNING AND 1 TABLET IN THE EVENING FOR ADDISONS DISEASE, Disp: 270 tablet, Rfl: 3 .  lansoprazole (PREVACID) 15 MG capsule, Take 1 capsule (15 mg total) by mouth daily., Disp: 90 capsule, Rfl: 3 .  LORazepam (ATIVAN) 1 MG tablet, Take 0.5-1 tablets (0.5-1 mg total) by mouth every 8 (eight) hours as needed for anxiety., Disp: 30 tablet, Rfl: 0 .  losartan (COZAAR) 25 MG tablet, Take 25 mg by mouth daily., Disp: , Rfl:  .  mirtazapine (REMERON) 45 MG tablet, Take by mouth., Disp: , Rfl:  .  riTUXimab (RITUXAN) 100 MG/10ML injection, Inject into the vein every 6 (six) months., Disp: , Rfl:  .  rosuvastatin (CRESTOR) 10 MG tablet, Take 10 mg by mouth at bedtime., Disp: , Rfl:  .  traMADol (ULTRAM) 50 MG tablet, Take 1 tablet (50 mg total) by mouth every 12 (twelve) hours as needed (Pain). Do not take more than twice  in one day., Disp: 30 tablet, Rfl: 0 .  warfarin (COUMADIN) 5 MG tablet, Take 1/2 tablet daily except 1  tablet  On Tuesday and Saturday  or  As directed, Disp: , Rfl:   Allergies:  Allergies  Allergen Reactions  . Other Other (See Comments), Nausea And Vomiting and Nausea Only  . Tetracyclines & Related Nausea Only    Past Medical History, Surgical history, Social history, and Family History were reviewed and updated.  Review of Systems: Review of Systems  Constitutional: Negative.   HENT:  Negative.   Eyes: Negative.   Respiratory: Negative.   Cardiovascular: Negative.   Gastrointestinal: Negative.   Endocrine: Negative.   Genitourinary: Negative.    Musculoskeletal: Negative.   Skin: Negative.   Neurological: Negative.   Hematological: Negative.   Psychiatric/Behavioral: Negative.      Physical Exam:  weight is 209 lb (94.8 kg). His oral temperature is 98 F (36.7 C). His blood pressure is 113/92 (abnormal) and his pulse is 87. His respiration is 18 and oxygen saturation is 100%.   Wt Readings from Last 3 Encounters:  11/24/20 209 lb (94.8 kg)  11/23/20 209 lb 3.2 oz (94.9 kg)  09/04/20 208 lb (94.3 kg)    Physical Exam Vitals reviewed.  HENT:     Head: Normocephalic and atraumatic.  Eyes:     Pupils: Pupils are equal, round, and reactive to light.  Cardiovascular:     Rate and Rhythm: Normal rate and regular rhythm.     Heart sounds: Normal heart sounds.  Pulmonary:     Effort: Pulmonary effort is normal.     Breath sounds: Normal breath sounds.  Abdominal:     General: Bowel sounds are normal.     Palpations: Abdomen is soft.  Musculoskeletal:        General: No tenderness or deformity. Normal range of motion.     Cervical back: Normal range of motion.  Lymphadenopathy:     Cervical: No cervical adenopathy.  Skin:    General: Skin is warm and dry.     Findings: No erythema or rash.  Neurological:     Mental Status: He is alert and oriented to person, place, and time.  Psychiatric:        Behavior: Behavior normal.        Thought Content: Thought content normal.        Judgment: Judgment normal.    Lab Results  Component Value Date   WBC 6.6 11/24/2020   HGB 14.6 11/24/2020   HCT 44.0 11/24/2020   MCV 91.5 11/24/2020   PLT 229 11/24/2020     Chemistry      Component Value Date/Time   NA 139 11/24/2020 1502   K 4.4 11/24/2020 1502   CL 101 11/24/2020 1502   CO2 31 11/24/2020 1502   BUN 17 11/24/2020 1502   CREATININE 1.13 11/24/2020 1502      Component Value Date/Time   CALCIUM 9.7 11/24/2020 1502   ALKPHOS 72 11/24/2020 1502   AST 29 11/24/2020 1502   ALT 27 11/24/2020 1502   BILITOT 0.4 11/24/2020 1502      Impression and Plan: Mr. Robert Kirby is a very nice 70 year old male. He has a history of thymic carcinoma.  Is been 7  years since he had problems with thymic cancer.  He has been on treatment in the past with carboplatinum/Taxol.  He now has a recurrence.  We will go ahead and try to treat with gemcitabine.  I know there are some studies I have looked at immunotherapy with thymic carcinoma.  However, with this autoimmune limbic encephalopathy, there erratically could be a relapse if we use immunotherapy.  I talked to he and his wife.  We we will probably do 2 weeks on and 1 week off.  I think this would be very reasonable to do.  Hopefully, we will see her response.  There is just a lot of studies looking at thymic carcinoma.  So it is hard to say what the chance of response is.  I would go ahead with three cycles and then repeat our scans.  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  As such, I will also incorporate Xgeva.  We will do 120 mg sq every 3 months.  I think this would be very reasonable.  Thankfully, he is in great shape.  He has excellent support from his wife and family.  He already has a Port-A-Cath in place.  We will start treatment next week.  I will plan to see him back when he starts his second cycle of treatment at the end of March.     Volanda Napoleon, MD 3/4/20224:13 PM

## 2020-11-24 NOTE — Assessment & Plan Note (Signed)
I think he is still better off using CPAP and discussed comfort issues. Plan- reduce pressure to 12 to reduce aerophagia

## 2020-11-24 NOTE — Assessment & Plan Note (Signed)
Some dyspnea pulling trashcans, but mainly notes when bending over, reflecting diaphragm weakness.  Plan- [place] on CPAP/ room air, to see if he still needs to use his O2 during sleep as discussed

## 2020-11-28 ENCOUNTER — Telehealth: Payer: Self-pay | Admitting: Hematology & Oncology

## 2020-11-28 NOTE — Telephone Encounter (Signed)
Called and spoke with patient regarding appointments that have been scheduled per 3/4 los .  He was ok with both date & time

## 2020-11-29 DIAGNOSIS — G4730 Sleep apnea, unspecified: Secondary | ICD-10-CM | POA: Diagnosis not present

## 2020-11-29 DIAGNOSIS — R0683 Snoring: Secondary | ICD-10-CM | POA: Diagnosis not present

## 2020-11-30 ENCOUNTER — Other Ambulatory Visit: Payer: Self-pay | Admitting: *Deleted

## 2020-11-30 DIAGNOSIS — C37 Malignant neoplasm of thymus: Secondary | ICD-10-CM

## 2020-12-01 ENCOUNTER — Inpatient Hospital Stay: Payer: Medicare Other

## 2020-12-01 ENCOUNTER — Other Ambulatory Visit: Payer: Self-pay

## 2020-12-01 VITALS — BP 115/72 | HR 78 | Temp 98.1°F | Resp 16

## 2020-12-01 DIAGNOSIS — Z9221 Personal history of antineoplastic chemotherapy: Secondary | ICD-10-CM | POA: Diagnosis not present

## 2020-12-01 DIAGNOSIS — C37 Malignant neoplasm of thymus: Secondary | ICD-10-CM | POA: Diagnosis not present

## 2020-12-01 DIAGNOSIS — D6861 Antiphospholipid syndrome: Secondary | ICD-10-CM | POA: Diagnosis not present

## 2020-12-01 DIAGNOSIS — C7951 Secondary malignant neoplasm of bone: Secondary | ICD-10-CM | POA: Diagnosis not present

## 2020-12-01 DIAGNOSIS — Z923 Personal history of irradiation: Secondary | ICD-10-CM | POA: Diagnosis not present

## 2020-12-01 DIAGNOSIS — Z95828 Presence of other vascular implants and grafts: Secondary | ICD-10-CM

## 2020-12-01 DIAGNOSIS — Z5111 Encounter for antineoplastic chemotherapy: Secondary | ICD-10-CM | POA: Diagnosis not present

## 2020-12-01 LAB — CMP (CANCER CENTER ONLY)
ALT: 27 U/L (ref 0–44)
AST: 25 U/L (ref 15–41)
Albumin: 4.1 g/dL (ref 3.5–5.0)
Alkaline Phosphatase: 78 U/L (ref 38–126)
Anion gap: 7 (ref 5–15)
BUN: 16 mg/dL (ref 8–23)
CO2: 30 mmol/L (ref 22–32)
Calcium: 9.3 mg/dL (ref 8.9–10.3)
Chloride: 101 mmol/L (ref 98–111)
Creatinine: 0.97 mg/dL (ref 0.61–1.24)
GFR, Estimated: >60 mL/min (ref >60)
Glucose, Bld: 102 mg/dL — ABNORMAL HIGH (ref 70–99)
Potassium: 4.3 mmol/L (ref 3.5–5.1)
Sodium: 138 mmol/L (ref 135–145)
Total Bilirubin: 0.3 mg/dL (ref 0.3–1.2)
Total Protein: 6.6 g/dL (ref 6.5–8.1)

## 2020-12-01 LAB — CBC WITH DIFFERENTIAL (CANCER CENTER ONLY)
Abs Immature Granulocytes: 0.03 10*3/uL (ref 0.00–0.07)
Basophils Absolute: 0 10*3/uL (ref 0.0–0.1)
Basophils Relative: 0 %
Eosinophils Absolute: 0.1 10*3/uL (ref 0.0–0.5)
Eosinophils Relative: 1 %
HCT: 39.5 % (ref 39.0–52.0)
Hemoglobin: 13.3 g/dL (ref 13.0–17.0)
Immature Granulocytes: 1 %
Lymphocytes Relative: 11 %
Lymphs Abs: 0.6 10*3/uL — ABNORMAL LOW (ref 0.7–4.0)
MCH: 30.7 pg (ref 26.0–34.0)
MCHC: 33.7 g/dL (ref 30.0–36.0)
MCV: 91.2 fL (ref 80.0–100.0)
Monocytes Absolute: 0.7 10*3/uL (ref 0.1–1.0)
Monocytes Relative: 12 %
Neutro Abs: 4.4 10*3/uL (ref 1.7–7.7)
Neutrophils Relative %: 75 %
Platelet Count: 232 10*3/uL (ref 150–400)
RBC: 4.33 MIL/uL (ref 4.22–5.81)
RDW: 14.8 % (ref 11.5–15.5)
WBC Count: 5.8 10*3/uL (ref 4.0–10.5)
nRBC: 0 % (ref 0.0–0.2)

## 2020-12-01 LAB — LACTATE DEHYDROGENASE: LDH: 180 U/L (ref 98–192)

## 2020-12-01 MED ORDER — SODIUM CHLORIDE 0.9% FLUSH
10.0000 mL | Freq: Once | INTRAVENOUS | Status: AC
  Administered 2020-12-01: 10 mL via INTRAVENOUS
  Filled 2020-12-01: qty 10

## 2020-12-01 MED ORDER — DENOSUMAB 120 MG/1.7ML ~~LOC~~ SOLN
120.0000 mg | Freq: Once | SUBCUTANEOUS | Status: AC
  Administered 2020-12-01: 120 mg via SUBCUTANEOUS

## 2020-12-01 MED ORDER — SODIUM CHLORIDE 0.9 % IV SOLN
Freq: Once | INTRAVENOUS | Status: AC
  Filled 2020-12-01: qty 250

## 2020-12-01 MED ORDER — SODIUM CHLORIDE 0.9% FLUSH
10.0000 mL | INTRAVENOUS | Status: DC | PRN
  Administered 2020-12-01: 10 mL
  Filled 2020-12-01: qty 10

## 2020-12-01 MED ORDER — ONDANSETRON HCL 8 MG PO TABS: 8.0000 mg | ORAL_TABLET | Freq: Two times a day (BID) | ORAL | 1 refills | Status: DC | PRN

## 2020-12-01 MED ORDER — PROCHLORPERAZINE MALEATE 10 MG PO TABS
10.0000 mg | ORAL_TABLET | Freq: Once | ORAL | Status: AC
  Administered 2020-12-01: 10 mg via ORAL

## 2020-12-01 MED ORDER — SODIUM CHLORIDE 0.9 % IV SOLN
2200.0000 mg | Freq: Once | INTRAVENOUS | Status: AC
  Administered 2020-12-01: 2200 mg via INTRAVENOUS
  Filled 2020-12-01: qty 52.6

## 2020-12-01 MED ORDER — PROCHLORPERAZINE MALEATE 10 MG PO TABS: 10.0000 mg | ORAL_TABLET | Freq: Four times a day (QID) | ORAL | 1 refills | Status: DC | PRN

## 2020-12-01 MED ORDER — PROCHLORPERAZINE MALEATE 10 MG PO TABS
ORAL_TABLET | ORAL | Status: AC
  Filled 2020-12-01: qty 1

## 2020-12-01 MED ORDER — HEPARIN SOD (PORK) LOCK FLUSH 100 UNIT/ML IV SOLN
500.0000 [IU] | Freq: Once | INTRAVENOUS | Status: AC | PRN
  Administered 2020-12-01: 500 [IU]
  Filled 2020-12-01: qty 5

## 2020-12-01 MED ORDER — DENOSUMAB 120 MG/1.7ML ~~LOC~~ SOLN
SUBCUTANEOUS | Status: AC
  Filled 2020-12-01: qty 1.7

## 2020-12-01 NOTE — Patient Instructions (Signed)
Tunneled Central Venous Catheter Flushing Guide  It is important to flush your tunneled central venous catheter each time you use it, both before and after you use it. Flushing your catheter will help prevent it from clogging. What are the risks? Risks may include:  Infection.  Air getting into the catheter and bloodstream. Supplies needed:  A clean pair of gloves.  A disinfecting wipe. Use an alcohol wipe, chlorhexidine wipe, or iodine wipe as told by your health care provider.  A 10 mL syringe that has been prefilled with saline solution.  An empty 10 mL syringe, if a substance called heparin was injected into your catheter. How to flush your catheter When you flush your catheter, make sure you follow any specific instructions from your health care provider or the manufacturer. These are general guidelines. Flushing your catheter before use If there is heparin in your catheter: 1. Wash your hands with soap and water. 2. Put on gloves. 3. Scrub the injection cap for a minimum of 15 seconds with a disinfecting wipe. 4. Unclamp the catheter. 5. Attach the empty syringe to the injection cap. 6. Pull the syringe plunger back and withdraw 10 mL of blood. 7. Place the syringe into an appropriate waste container. 8. Scrub the injection cap for 15 seconds with a disinfecting wipe. 9. Attach the prefilled syringe to the injection cap. 10. Flush the catheter by pushing the plunger forward until all the liquid from the syringe is in the catheter. 11. Remove the syringe from the injection cap. 12. Clamp the catheter. If there is no heparin in your catheter: 1. Wash your hands with soap and water. 2. Put on gloves. 3. Scrub the injection cap for 15 seconds with a disinfecting wipe. 4. Unclamp the catheter. 5. Attach the prefilled syringe to the injection cap. 6. Flush the catheter by pushing the plunger forward until 5 mL of the liquid from the syringe is in the catheter. 7. Pull back on  the syringe until you see blood in the catheter. 8. If you have been asked to collect any blood, follow your health care provider's instructions. Otherwise, flush the catheter with the rest of the solution from the syringe. 9. Remove the syringe from the injection cap. 10. Clamp the catheter.   Flushing your catheter after use 1. Wash your hands with soap and water. 2. Put on gloves. 3. Scrub the injection cap for 15 seconds with a disinfecting wipe. 4. Unclamp the catheter. 5. Attach the prefilled syringe to the injection cap. 6. Flush the catheter by pushing the plunger forward until all of the liquid from the syringe is in the catheter. 7. Remove the syringe from the injection cap. 8. Clamp the catheter. Problems and solutions  If blood cannot be completely cleared from the injection cap, you may need to have the injection cap replaced.  If the catheter is difficult to flush, use the pulsing method. The pulsing method involves pushing only a few milliliters of solution into the catheter at a time and pausing between pushes.  If you do not see blood in the catheter when you pull back on the syringe, change your body position, such as by raising your arms above your head. Take a deep breath and cough. Then, pull back on the syringe. If you still do not see blood, flush the catheter with a small amount of solution. Then, change positions again and take a breath or cough. Pull back on the syringe again. If you still do not   see blood, finish flushing the catheter and contact your health care provider. Do not use your catheter until your health care provider says it is okay. General tips  Have someone help you flush your catheter, if possible.  Do not force fluid through your catheter.  Do not use a syringe that is larger or smaller than 10 mL. Using a smaller syringe can make the catheter burst.  Do not use your catheter without flushing it first if it has heparin in it. Contact a health  care provider if:  You cannot see any blood in the catheter when you flush it before using it.  Your catheter is difficult to flush. Get help right away if:  You cannot flush the catheter.  The catheter leaks when you flush it or when there is fluid in it.  There are cracks or breaks in the catheter. Summary  It is important to flush your tunneled central venous catheter each time you use it, both before and after you use it.  Scrub the injection cap for 15 seconds with a disinfecting wipe before and after you flush it.  When you flush your catheter, make sure you follow any specific instructions from your health care provider or the manufacturer.  Get help right away if you cannot flush the catheter. This information is not intended to replace advice given to you by your health care provider. Make sure you discuss any questions you have with your health care provider. Document Revised: 11/18/2019 Document Reviewed: 11/25/2018 Elsevier Patient Education  2021 Elsevier Inc.  

## 2020-12-01 NOTE — Patient Instructions (Addendum)
Gemcitabine injection What is this medicine? GEMCITABINE (jem SYE ta been) is a chemotherapy drug. This medicine is used to treat many types of cancer like breast cancer, lung cancer, pancreatic cancer, and ovarian cancer. This medicine may be used for other purposes; ask your health care provider or pharmacist if you have questions. COMMON BRAND NAME(S): Gemzar, Infugem What should I tell my health care provider before I take this medicine? They need to know if you have any of these conditions:  blood disorders  infection  kidney disease  liver disease  lung or breathing disease, like asthma  recent or ongoing radiation therapy  an unusual or allergic reaction to gemcitabine, other chemotherapy, other medicines, foods, dyes, or preservatives  pregnant or trying to get pregnant  breast-feeding How should I use this medicine? This drug is given as an infusion into a vein. It is administered in a hospital or clinic by a specially trained health care professional. Talk to your pediatrician regarding the use of this medicine in children. Special care may be needed. Overdosage: If you think you have taken too much of this medicine contact a poison control center or emergency room at once. NOTE: This medicine is only for you. Do not share this medicine with others. What if I miss a dose? It is important not to miss your dose. Call your doctor or health care professional if you are unable to keep an appointment. What may interact with this medicine?  medicines to increase blood counts like filgrastim, pegfilgrastim, sargramostim  some other chemotherapy drugs like cisplatin  vaccines Talk to your doctor or health care professional before taking any of these medicines:  acetaminophen  aspirin  ibuprofen  ketoprofen  naproxen This list may not describe all possible interactions. Give your health care provider a list of all the medicines, herbs, non-prescription drugs, or  dietary supplements you use. Also tell them if you smoke, drink alcohol, or use illegal drugs. Some items may interact with your medicine. What should I watch for while using this medicine? Visit your doctor for checks on your progress. This drug may make you feel generally unwell. This is not uncommon, as chemotherapy can affect healthy cells as well as cancer cells. Report any side effects. Continue your course of treatment even though you feel ill unless your doctor tells you to stop. In some cases, you may be given additional medicines to help with side effects. Follow all directions for their use. Call your doctor or health care professional for advice if you get a fever, chills or sore throat, or other symptoms of a cold or flu. Do not treat yourself. This drug decreases your body's ability to fight infections. Try to avoid being around people who are sick. This medicine may increase your risk to bruise or bleed. Call your doctor or health care professional if you notice any unusual bleeding. Be careful brushing and flossing your teeth or using a toothpick because you may get an infection or bleed more easily. If you have any dental work done, tell your dentist you are receiving this medicine. Avoid taking products that contain aspirin, acetaminophen, ibuprofen, naproxen, or ketoprofen unless instructed by your doctor. These medicines may hide a fever. Do not become pregnant while taking this medicine or for 6 months after stopping it. Women should inform their doctor if they wish to become pregnant or think they might be pregnant. Men should not father a child while taking this medicine and for 3 months after stopping it.  There is a potential for serious side effects to an unborn child. Talk to your health care professional or pharmacist for more information. Do not breast-feed an infant while taking this medicine or for at least 1 week after stopping it. Men should inform their doctors if they wish  to father a child. This medicine may lower sperm counts. Talk with your doctor or health care professional if you are concerned about your fertility. What side effects may I notice from receiving this medicine? Side effects that you should report to your doctor or health care professional as soon as possible:  allergic reactions like skin rash, itching or hives, swelling of the face, lips, or tongue  breathing problems  pain, redness, or irritation at site where injected  signs and symptoms of a dangerous change in heartbeat or heart rhythm like chest pain; dizziness; fast or irregular heartbeat; palpitations; feeling faint or lightheaded, falls; breathing problems  signs of decreased platelets or bleeding - bruising, pinpoint red spots on the skin, black, tarry stools, blood in the urine  signs of decreased red blood cells - unusually weak or tired, feeling faint or lightheaded, falls  signs of infection - fever or chills, cough, sore throat, pain or difficulty passing urine  signs and symptoms of kidney injury like trouble passing urine or change in the amount of urine  signs and symptoms of liver injury like dark yellow or brown urine; general ill feeling or flu-like symptoms; light-colored stools; loss of appetite; nausea; right upper belly pain; unusually weak or tired; yellowing of the eyes or skin  swelling of ankles, feet, hands Side effects that usually do not require medical attention (report to your doctor or health care professional if they continue or are bothersome):  constipation  diarrhea  hair loss  loss of appetite  nausea  rash  vomiting This list may not describe all possible side effects. Call your doctor for medical advice about side effects. You may report side effects to FDA at 1-800-FDA-1088. Where should I keep my medicine? This drug is given in a hospital or clinic and will not be stored at home. NOTE: This sheet is a summary. It may not cover all  possible information. If you have questions about this medicine, talk to your doctor, pharmacist, or health care provider.  2021 Elsevier/Gold Standard (2017-12-03 18:06:11) Denosumab injection What is this medicine? DENOSUMAB (den oh sue mab) slows bone breakdown. Prolia is used to treat osteoporosis in women after menopause and in men, and in people who are taking corticosteroids for 6 months or more. Delton See is used to treat a high calcium level due to cancer and to prevent bone fractures and other bone problems caused by multiple myeloma or cancer bone metastases. Delton See is also used to treat giant cell tumor of the bone. This medicine may be used for other purposes; ask your health care provider or pharmacist if you have questions. COMMON BRAND NAME(S): Prolia, XGEVA What should I tell my health care provider before I take this medicine? They need to know if you have any of these conditions:  dental disease  having surgery or tooth extraction  infection  kidney disease  low levels of calcium or Vitamin D in the blood  malnutrition  on hemodialysis  skin conditions or sensitivity  thyroid or parathyroid disease  an unusual reaction to denosumab, other medicines, foods, dyes, or preservatives  pregnant or trying to get pregnant  breast-feeding How should I use this medicine? This medicine is  for injection under the skin. It is given by a health care professional in a hospital or clinic setting. A special MedGuide will be given to you before each treatment. Be sure to read this information carefully each time. For Prolia, talk to your pediatrician regarding the use of this medicine in children. Special care may be needed. For Delton See, talk to your pediatrician regarding the use of this medicine in children. While this drug may be prescribed for children as young as 13 years for selected conditions, precautions do apply. Overdosage: If you think you have taken too much of this  medicine contact a poison control center or emergency room at once. NOTE: This medicine is only for you. Do not share this medicine with others. What if I miss a dose? It is important not to miss your dose. Call your doctor or health care professional if you are unable to keep an appointment. What may interact with this medicine? Do not take this medicine with any of the following medications:  other medicines containing denosumab This medicine may also interact with the following medications:  medicines that lower your chance of fighting infection  steroid medicines like prednisone or cortisone This list may not describe all possible interactions. Give your health care provider a list of all the medicines, herbs, non-prescription drugs, or dietary supplements you use. Also tell them if you smoke, drink alcohol, or use illegal drugs. Some items may interact with your medicine. What should I watch for while using this medicine? Visit your doctor or health care professional for regular checks on your progress. Your doctor or health care professional may order blood tests and other tests to see how you are doing. Call your doctor or health care professional for advice if you get a fever, chills or sore throat, or other symptoms of a cold or flu. Do not treat yourself. This drug may decrease your body's ability to fight infection. Try to avoid being around people who are sick. You should make sure you get enough calcium and vitamin D while you are taking this medicine, unless your doctor tells you not to. Discuss the foods you eat and the vitamins you take with your health care professional. See your dentist regularly. Brush and floss your teeth as directed. Before you have any dental work done, tell your dentist you are receiving this medicine. Do not become pregnant while taking this medicine or for 5 months after stopping it. Talk with your doctor or health care professional about your birth  control options while taking this medicine. Women should inform their doctor if they wish to become pregnant or think they might be pregnant. There is a potential for serious side effects to an unborn child. Talk to your health care professional or pharmacist for more information. What side effects may I notice from receiving this medicine? Side effects that you should report to your doctor or health care professional as soon as possible:  allergic reactions like skin rash, itching or hives, swelling of the face, lips, or tongue  bone pain  breathing problems  dizziness  jaw pain, especially after dental work  redness, blistering, peeling of the skin  signs and symptoms of infection like fever or chills; cough; sore throat; pain or trouble passing urine  signs of low calcium like fast heartbeat, muscle cramps or muscle pain; pain, tingling, numbness in the hands or feet; seizures  unusual bleeding or bruising  unusually weak or tired Side effects that usually do not require  medical attention (report to your doctor or health care professional if they continue or are bothersome):  constipation  diarrhea  headache  joint pain  loss of appetite  muscle pain  runny nose  tiredness  upset stomach This list may not describe all possible side effects. Call your doctor for medical advice about side effects. You may report side effects to FDA at 1-800-FDA-1088. Where should I keep my medicine? This medicine is only given in a clinic, doctor's office, or other health care setting and will not be stored at home. NOTE: This sheet is a summary. It may not cover all possible information. If you have questions about this medicine, talk to your doctor, pharmacist, or health care provider.  2021 Elsevier/Gold Standard (2018-01-16 16:10:44)

## 2020-12-01 NOTE — Progress Notes (Signed)
Pharmacist Chemotherapy Monitoring - Initial Assessment    Anticipated start date: 12/08/20   Regimen:  . Are orders appropriate based on the patient's diagnosis, regimen, and cycle? Yes . Does the plan date match the patient's scheduled date? Yes . Is the sequencing of drugs appropriate? Yes . Are the premedications appropriate for the patient's regimen? Yes . Prior Authorization for treatment is: Approved o If applicable, is the correct biosimilar selected based on the patient's insurance? not applicable  Organ Function and Labs: Marland Kitchen Are dose adjustments needed based on the patient's renal function, hepatic function, or hematologic function? No . Are appropriate labs ordered prior to the start of patient's treatment? Yes . Other organ system assessment, if indicated: N/A . The following baseline labs, if indicated, have been ordered: N/A  Dose Assessment: . Are the drug doses appropriate? Yes . Are the following correct: o Drug concentrations Yes o IV fluid compatible with drug Yes o Administration routes Yes o Timing of therapy Yes . If applicable, does the patient have documented access for treatment and/or plans for port-a-cath placement? yes . If applicable, have lifetime cumulative doses been properly documented and assessed? not applicable Lifetime Dose Tracking  No doses have been documented on this patient for the following tracked chemicals: Doxorubicin, Epirubicin, Idarubicin, Daunorubicin, Mitoxantrone, Bleomycin, Oxaliplatin, Carboplatin, Liposomal Doxorubicin  o   Toxicity Monitoring/Prevention: . The patient has the following take home antiemetics prescribed: Ondansetron, Prochlorperazine, Dexamethasone and Lorazepam . The patient has the following take home medications prescribed: N/A . Medication allergies and previous infusion related reactions, if applicable, have been reviewed and addressed. Yes . The patient's current medication list has been assessed for  drug-drug interactions with their chemotherapy regimen. no significant drug-drug interactions were identified on review.  Order Review: . Are the treatment plan orders signed? Yes . Is the patient scheduled to see a provider prior to their treatment? Yes  I verify that I have reviewed each item in the above checklist and answered each question accordingly.  Claybon Jabs, [facility], 12/01/2020  11:53 AM

## 2020-12-05 ENCOUNTER — Other Ambulatory Visit: Payer: Self-pay | Admitting: Hematology & Oncology

## 2020-12-07 ENCOUNTER — Other Ambulatory Visit: Payer: Self-pay | Admitting: *Deleted

## 2020-12-07 DIAGNOSIS — Z95828 Presence of other vascular implants and grafts: Secondary | ICD-10-CM

## 2020-12-07 DIAGNOSIS — C37 Malignant neoplasm of thymus: Secondary | ICD-10-CM

## 2020-12-07 DIAGNOSIS — Z7189 Other specified counseling: Secondary | ICD-10-CM

## 2020-12-08 ENCOUNTER — Inpatient Hospital Stay: Payer: Medicare Other

## 2020-12-08 ENCOUNTER — Telehealth: Payer: Self-pay | Admitting: Family Medicine

## 2020-12-08 ENCOUNTER — Other Ambulatory Visit: Payer: Self-pay

## 2020-12-08 DIAGNOSIS — Z923 Personal history of irradiation: Secondary | ICD-10-CM | POA: Diagnosis not present

## 2020-12-08 DIAGNOSIS — Z5111 Encounter for antineoplastic chemotherapy: Secondary | ICD-10-CM | POA: Diagnosis not present

## 2020-12-08 DIAGNOSIS — Z9221 Personal history of antineoplastic chemotherapy: Secondary | ICD-10-CM | POA: Diagnosis not present

## 2020-12-08 DIAGNOSIS — C37 Malignant neoplasm of thymus: Secondary | ICD-10-CM

## 2020-12-08 DIAGNOSIS — Z7189 Other specified counseling: Secondary | ICD-10-CM

## 2020-12-08 DIAGNOSIS — C7951 Secondary malignant neoplasm of bone: Secondary | ICD-10-CM | POA: Diagnosis not present

## 2020-12-08 DIAGNOSIS — D6861 Antiphospholipid syndrome: Secondary | ICD-10-CM | POA: Diagnosis not present

## 2020-12-08 DIAGNOSIS — Z95828 Presence of other vascular implants and grafts: Secondary | ICD-10-CM

## 2020-12-08 LAB — CBC WITH DIFFERENTIAL (CANCER CENTER ONLY)
Abs Immature Granulocytes: 0.01 10*3/uL (ref 0.00–0.07)
Basophils Absolute: 0 10*3/uL (ref 0.0–0.1)
Basophils Relative: 0 %
Eosinophils Absolute: 0 10*3/uL (ref 0.0–0.5)
Eosinophils Relative: 0 %
HCT: 38.7 % — ABNORMAL LOW (ref 39.0–52.0)
Hemoglobin: 12.8 g/dL — ABNORMAL LOW (ref 13.0–17.0)
Immature Granulocytes: 0 %
Lymphocytes Relative: 13 %
Lymphs Abs: 0.6 10*3/uL — ABNORMAL LOW (ref 0.7–4.0)
MCH: 30.1 pg (ref 26.0–34.0)
MCHC: 33.1 g/dL (ref 30.0–36.0)
MCV: 91.1 fL (ref 80.0–100.0)
Monocytes Absolute: 0.5 10*3/uL (ref 0.1–1.0)
Monocytes Relative: 11 %
Neutro Abs: 3.3 10*3/uL (ref 1.7–7.7)
Neutrophils Relative %: 76 %
Platelet Count: 201 10*3/uL (ref 150–400)
RBC: 4.25 MIL/uL (ref 4.22–5.81)
RDW: 14.5 % (ref 11.5–15.5)
WBC Count: 4.5 10*3/uL (ref 4.0–10.5)
nRBC: 0 % (ref 0.0–0.2)

## 2020-12-08 LAB — CMP (CANCER CENTER ONLY)
ALT: 41 U/L (ref 0–44)
AST: 31 U/L (ref 15–41)
Albumin: 3.9 g/dL (ref 3.5–5.0)
Alkaline Phosphatase: 71 U/L (ref 38–126)
Anion gap: 5 (ref 5–15)
BUN: 11 mg/dL (ref 8–23)
CO2: 27 mmol/L (ref 22–32)
Calcium: 8.5 mg/dL — ABNORMAL LOW (ref 8.9–10.3)
Chloride: 105 mmol/L (ref 98–111)
Creatinine: 0.79 mg/dL (ref 0.61–1.24)
GFR, Estimated: >60 mL/min (ref >60)
Glucose, Bld: 106 mg/dL — ABNORMAL HIGH (ref 70–99)
Potassium: 4.3 mmol/L (ref 3.5–5.1)
Sodium: 137 mmol/L (ref 135–145)
Total Bilirubin: 0.2 mg/dL — ABNORMAL LOW (ref 0.3–1.2)
Total Protein: 6.7 g/dL (ref 6.5–8.1)

## 2020-12-08 MED ORDER — SODIUM CHLORIDE 0.9 % IV SOLN
2200.0000 mg | Freq: Once | INTRAVENOUS | Status: AC
  Administered 2020-12-08: 2200 mg via INTRAVENOUS
  Filled 2020-12-08: qty 52.6

## 2020-12-08 MED ORDER — COLD PACK MISC ONCOLOGY
1.0000 | Freq: Once | Status: DC | PRN
  Filled 2020-12-08: qty 1

## 2020-12-08 MED ORDER — PROCHLORPERAZINE MALEATE 10 MG PO TABS
10.0000 mg | ORAL_TABLET | Freq: Once | ORAL | Status: AC
  Administered 2020-12-08: 10 mg via ORAL

## 2020-12-08 MED ORDER — SODIUM CHLORIDE 0.9 % IV SOLN
Freq: Once | INTRAVENOUS | Status: AC
  Filled 2020-12-08: qty 250

## 2020-12-08 MED ORDER — SODIUM CHLORIDE 0.9% FLUSH
10.0000 mL | INTRAVENOUS | Status: DC | PRN
  Administered 2020-12-08: 10 mL
  Filled 2020-12-08: qty 10

## 2020-12-08 MED ORDER — HEPARIN SOD (PORK) LOCK FLUSH 100 UNIT/ML IV SOLN
500.0000 [IU] | Freq: Once | INTRAVENOUS | Status: AC | PRN
  Administered 2020-12-08: 500 [IU]
  Filled 2020-12-08: qty 5

## 2020-12-08 MED ORDER — PROCHLORPERAZINE MALEATE 10 MG PO TABS
ORAL_TABLET | ORAL | Status: AC
  Filled 2020-12-08: qty 1

## 2020-12-08 NOTE — Telephone Encounter (Signed)
[facility] mail order requesting rx rosuvastatin (CRESTOR) 10 MG tablet [[id number]]   [place], [place] Phone:  [phone]  Fax:  [phone]

## 2020-12-08 NOTE — Patient Instructions (Signed)

## 2020-12-08 NOTE — Patient Instructions (Signed)
Gemcitabine injection What is this medicine? GEMCITABINE (jem SYE ta been) is a chemotherapy drug. This medicine is used to treat many types of cancer like breast cancer, lung cancer, pancreatic cancer, and ovarian cancer. This medicine may be used for other purposes; ask your health care provider or pharmacist if you have questions. COMMON BRAND NAME(S): Gemzar, Infugem What should I tell my health care provider before I take this medicine? They need to know if you have any of these conditions:  blood disorders  infection  kidney disease  liver disease  lung or breathing disease, like asthma  recent or ongoing radiation therapy  an unusual or allergic reaction to gemcitabine, other chemotherapy, other medicines, foods, dyes, or preservatives  pregnant or trying to get pregnant  breast-feeding How should I use this medicine? This drug is given as an infusion into a vein. It is administered in a hospital or clinic by a specially trained health care professional. Talk to your pediatrician regarding the use of this medicine in children. Special care may be needed. Overdosage: If you think you have taken too much of this medicine contact a poison control center or emergency room at once. NOTE: This medicine is only for you. Do not share this medicine with others. What if I miss a dose? It is important not to miss your dose. Call your doctor or health care professional if you are unable to keep an appointment. What may interact with this medicine?  medicines to increase blood counts like filgrastim, pegfilgrastim, sargramostim  some other chemotherapy drugs like cisplatin  vaccines Talk to your doctor or health care professional before taking any of these medicines:  acetaminophen  aspirin  ibuprofen  ketoprofen  naproxen This list may not describe all possible interactions. Give your health care provider a list of all the medicines, herbs, non-prescription drugs, or  dietary supplements you use. Also tell them if you smoke, drink alcohol, or use illegal drugs. Some items may interact with your medicine. What should I watch for while using this medicine? Visit your doctor for checks on your progress. This drug may make you feel generally unwell. This is not uncommon, as chemotherapy can affect healthy cells as well as cancer cells. Report any side effects. Continue your course of treatment even though you feel ill unless your doctor tells you to stop. In some cases, you may be given additional medicines to help with side effects. Follow all directions for their use. Call your doctor or health care professional for advice if you get a fever, chills or sore throat, or other symptoms of a cold or flu. Do not treat yourself. This drug decreases your body's ability to fight infections. Try to avoid being around people who are sick. This medicine may increase your risk to bruise or bleed. Call your doctor or health care professional if you notice any unusual bleeding. Be careful brushing and flossing your teeth or using a toothpick because you may get an infection or bleed more easily. If you have any dental work done, tell your dentist you are receiving this medicine. Avoid taking products that contain aspirin, acetaminophen, ibuprofen, naproxen, or ketoprofen unless instructed by your doctor. These medicines may hide a fever. Do not become pregnant while taking this medicine or for 6 months after stopping it. Women should inform their doctor if they wish to become pregnant or think they might be pregnant. Men should not father a child while taking this medicine and for 3 months after stopping it.  There is a potential for serious side effects to an unborn child. Talk to your health care professional or pharmacist for more information. Do not breast-feed an infant while taking this medicine or for at least 1 week after stopping it. Men should inform their doctors if they wish  to father a child. This medicine may lower sperm counts. Talk with your doctor or health care professional if you are concerned about your fertility. What side effects may I notice from receiving this medicine? Side effects that you should report to your doctor or health care professional as soon as possible:  allergic reactions like skin rash, itching or hives, swelling of the face, lips, or tongue  breathing problems  pain, redness, or irritation at site where injected  signs and symptoms of a dangerous change in heartbeat or heart rhythm like chest pain; dizziness; fast or irregular heartbeat; palpitations; feeling faint or lightheaded, falls; breathing problems  signs of decreased platelets or bleeding - bruising, pinpoint red spots on the skin, black, tarry stools, blood in the urine  signs of decreased red blood cells - unusually weak or tired, feeling faint or lightheaded, falls  signs of infection - fever or chills, cough, sore throat, pain or difficulty passing urine  signs and symptoms of kidney injury like trouble passing urine or change in the amount of urine  signs and symptoms of liver injury like dark yellow or brown urine; general ill feeling or flu-like symptoms; light-colored stools; loss of appetite; nausea; right upper belly pain; unusually weak or tired; yellowing of the eyes or skin  swelling of ankles, feet, hands Side effects that usually do not require medical attention (report to your doctor or health care professional if they continue or are bothersome):  constipation  diarrhea  hair loss  loss of appetite  nausea  rash  vomiting This list may not describe all possible side effects. Call your doctor for medical advice about side effects. You may report side effects to FDA at 1-800-FDA-1088. Where should I keep my medicine? This drug is given in a hospital or clinic and will not be stored at home. NOTE: This sheet is a summary. It may not cover all  possible information. If you have questions about this medicine, talk to your doctor, pharmacist, or health care provider.  2021 Elsevier/Gold Standard (2017-12-03 18:06:11)

## 2020-12-11 DIAGNOSIS — F331 Major depressive disorder, recurrent, moderate: Secondary | ICD-10-CM | POA: Diagnosis not present

## 2020-12-11 MED ORDER — ROSUVASTATIN CALCIUM 10 MG PO TABS: 10.0000 mg | ORAL_TABLET | Freq: Every day | ORAL | 3 refills | Status: DC

## 2020-12-14 DIAGNOSIS — Z7901 Long term (current) use of anticoagulants: Secondary | ICD-10-CM | POA: Diagnosis not present

## 2020-12-17 ENCOUNTER — Other Ambulatory Visit: Payer: Self-pay

## 2020-12-17 ENCOUNTER — Encounter (HOSPITAL_BASED_OUTPATIENT_CLINIC_OR_DEPARTMENT_OTHER): Payer: Self-pay | Admitting: Emergency Medicine

## 2020-12-17 ENCOUNTER — Emergency Department (HOSPITAL_BASED_OUTPATIENT_CLINIC_OR_DEPARTMENT_OTHER): Payer: Medicare Other

## 2020-12-17 ENCOUNTER — Emergency Department (HOSPITAL_BASED_OUTPATIENT_CLINIC_OR_DEPARTMENT_OTHER)
Admission: EM | Admit: 2020-12-17 | Discharge: 2020-12-17 | Disposition: A | Discharge status: Home / Self Care | Payer: Medicare Other | Attending: Emergency Medicine | Admitting: Emergency Medicine

## 2020-12-17 DIAGNOSIS — Z7901 Long term (current) use of anticoagulants: Secondary | ICD-10-CM | POA: Diagnosis not present

## 2020-12-17 DIAGNOSIS — R9431 Abnormal electrocardiogram [ECG] [EKG]: Secondary | ICD-10-CM | POA: Diagnosis not present

## 2020-12-17 DIAGNOSIS — R197 Diarrhea, unspecified: Secondary | ICD-10-CM | POA: Diagnosis not present

## 2020-12-17 DIAGNOSIS — J9811 Atelectasis: Secondary | ICD-10-CM | POA: Diagnosis not present

## 2020-12-17 DIAGNOSIS — R0602 Shortness of breath: Secondary | ICD-10-CM | POA: Diagnosis not present

## 2020-12-17 DIAGNOSIS — J90 Pleural effusion, not elsewhere classified: Secondary | ICD-10-CM | POA: Diagnosis not present

## 2020-12-17 DIAGNOSIS — Z20822 Contact with and (suspected) exposure to covid-19: Secondary | ICD-10-CM | POA: Diagnosis not present

## 2020-12-17 DIAGNOSIS — R5383 Other fatigue: Secondary | ICD-10-CM

## 2020-12-17 DIAGNOSIS — R059 Cough, unspecified: Secondary | ICD-10-CM | POA: Insufficient documentation

## 2020-12-17 DIAGNOSIS — Z950 Presence of cardiac pacemaker: Secondary | ICD-10-CM | POA: Diagnosis not present

## 2020-12-17 DIAGNOSIS — R531 Weakness: Secondary | ICD-10-CM | POA: Diagnosis not present

## 2020-12-17 DIAGNOSIS — Z85238 Personal history of other malignant neoplasm of thymus: Secondary | ICD-10-CM | POA: Diagnosis not present

## 2020-12-17 DIAGNOSIS — R911 Solitary pulmonary nodule: Secondary | ICD-10-CM | POA: Diagnosis not present

## 2020-12-17 DIAGNOSIS — R918 Other nonspecific abnormal finding of lung field: Secondary | ICD-10-CM | POA: Diagnosis not present

## 2020-12-17 DIAGNOSIS — R0689 Other abnormalities of breathing: Secondary | ICD-10-CM | POA: Diagnosis not present

## 2020-12-17 LAB — CBC WITH DIFFERENTIAL/PLATELET
Abs Immature Granulocytes: 0.02 10*3/uL (ref 0.00–0.07)
Basophils Absolute: 0 10*3/uL (ref 0.0–0.1)
Basophils Relative: 0 %
Eosinophils Absolute: 0 10*3/uL (ref 0.0–0.5)
Eosinophils Relative: 1 %
HCT: 36.2 % — ABNORMAL LOW (ref 39.0–52.0)
Hemoglobin: 12.3 g/dL — ABNORMAL LOW (ref 13.0–17.0)
Immature Granulocytes: 0 %
Lymphocytes Relative: 12 %
Lymphs Abs: 0.7 10*3/uL (ref 0.7–4.0)
MCH: 31.1 pg (ref 26.0–34.0)
MCHC: 34 g/dL (ref 30.0–36.0)
MCV: 91.4 fL (ref 80.0–100.0)
Monocytes Absolute: 0.9 10*3/uL (ref 0.1–1.0)
Monocytes Relative: 15 %
Neutro Abs: 4 10*3/uL (ref 1.7–7.7)
Neutrophils Relative %: 72 %
Platelets: 155 10*3/uL (ref 150–400)
RBC: 3.96 MIL/uL — ABNORMAL LOW (ref 4.22–5.81)
RDW: 14.8 % (ref 11.5–15.5)
WBC: 5.6 10*3/uL (ref 4.0–10.5)
nRBC: 0 % (ref 0.0–0.2)

## 2020-12-17 LAB — LIPASE, BLOOD: Lipase: 25 U/L (ref 11–51)

## 2020-12-17 LAB — LACTIC ACID, PLASMA: Lactic Acid, Venous: 0.7 mmol/L (ref 0.5–1.9)

## 2020-12-17 LAB — COMPREHENSIVE METABOLIC PANEL
ALT: 42 U/L (ref 0–44)
AST: 32 U/L (ref 15–41)
Albumin: 3.4 g/dL — ABNORMAL LOW (ref 3.5–5.0)
Alkaline Phosphatase: 79 U/L (ref 38–126)
Anion gap: 8 (ref 5–15)
BUN: 13 mg/dL (ref 8–23)
CO2: 23 mmol/L (ref 22–32)
Calcium: 8.2 mg/dL — ABNORMAL LOW (ref 8.9–10.3)
Chloride: 102 mmol/L (ref 98–111)
Creatinine, Ser: 0.7 mg/dL (ref 0.61–1.24)
GFR, Estimated: >60 mL/min (ref >60)
Glucose, Bld: 99 mg/dL (ref 70–99)
Potassium: 4.3 mmol/L (ref 3.5–5.1)
Sodium: 133 mmol/L — ABNORMAL LOW (ref 135–145)
Total Bilirubin: 0.4 mg/dL (ref 0.3–1.2)
Total Protein: 6.6 g/dL (ref 6.5–8.1)

## 2020-12-17 LAB — URINALYSIS, ROUTINE W REFLEX MICROSCOPIC
Bilirubin Urine: NEGATIVE
Glucose, UA: NEGATIVE mg/dL
Hgb urine dipstick: NEGATIVE
Ketones, ur: NEGATIVE mg/dL
Leukocytes,Ua: NEGATIVE
Nitrite: NEGATIVE
Protein, ur: NEGATIVE mg/dL
Specific Gravity, Urine: 1.025 (ref 1.005–1.030)
pH: 6.5 (ref 5.0–8.0)

## 2020-12-17 LAB — PROTIME-INR
INR: 2.2 — ABNORMAL HIGH (ref 0.8–1.2)
Prothrombin Time: 23.8 seconds — ABNORMAL HIGH (ref 11.4–15.2)

## 2020-12-17 MED ORDER — IOHEXOL 350 MG/ML SOLN
100.0000 mL | Freq: Once | INTRAVENOUS | Status: AC | PRN
  Administered 2020-12-17: 78 mL via INTRAVENOUS

## 2020-12-17 MED ORDER — KETOROLAC TROMETHAMINE 30 MG/ML IJ SOLN
30.0000 mg | Freq: Once | INTRAMUSCULAR | Status: AC
  Administered 2020-12-17: 30 mg via INTRAVENOUS
  Filled 2020-12-17: qty 1

## 2020-12-17 MED ORDER — HEPARIN SOD (PORK) LOCK FLUSH 100 UNIT/ML IV SOLN
500.0000 [IU] | Freq: Once | INTRAVENOUS | Status: AC
  Administered 2020-12-17: 500 [IU]
  Filled 2020-12-17: qty 5

## 2020-12-17 MED ORDER — SODIUM CHLORIDE 0.9 % IV BOLUS
1000.0000 mL | Freq: Once | INTRAVENOUS | Status: AC
  Administered 2020-12-17: 1000 mL via INTRAVENOUS

## 2020-12-17 NOTE — ED Notes (Signed)
Port ok to used per Dr. Ronnald Nian

## 2020-12-17 NOTE — ED Triage Notes (Signed)
Pt presents to [facility]. Pt c/o increased weakness that began this morning. Pt had diarrhea x2 this morning. Pt has hx of cancer last chemo treatment 12/08/2020. Per EMS pt had increased work of breathing on their arrival. Increased home O2 from 2L to 4L [place]. Pt AAO x4. Hx encephalopathy.

## 2020-12-17 NOTE — ED Provider Notes (Signed)
[facility] EMERGENCY DEPARTMENT Provider Note   CSN: [id number] Arrival date & time: 12/17/20  1319     History Chief Complaint  Patient presents with  . Weakness    Robert Kirby is a 70 y.o. male.  The history is provided by the patient.  Weakness Severity:  Mild Onset quality:  Gradual Timing:  Constant Progression:  Unchanged Chronicity:  New Context comment:  Diarrhea x 2 today. On chemo for thymic cancer. Feels more run down than normal today after diarrhea. Last chemo over a week ago. No chest pain or cough. Relieved by:  Nothing Worsened by:  Nothing Associated symptoms: diarrhea   Associated symptoms: no abdominal pain, no arthralgias, no chest pain, no cough, no dysuria, no numbness in extremities, no fever, no melena, no nausea, no seizures, no shortness of breath and no vomiting        Past Medical History:  Diagnosis Date  . Addison's disease ([place])   . Antiphospholipid antibody syndrome ([place])   . Arrhythmia    Pacemaker  . Autoimmune encephalomyelitis   . Depression   . GERD (gastroesophageal reflux disease)   . Goals of care, counseling/discussion 11/24/2020  . History of blood transfusion    13 units from a GI Bleed.   Marland Kitchen History of GI bleed   . OSA on CPAP   . Sleep apnea    on CPAP  . 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 Goals of care, counseling/discussion 11/24/2020  . Seizure ([place]) 02/04/2020  . 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 Left-sided low back pain without sciatica 04/02/2019  . Dyspnea on exertion 04/02/2019  . Right inguinal hernia 03/31/2019  . Seasonal allergic rhinitis due to pollen 12/19/2017  . Depression   . OSA on CPAP   . Autoimmune encephalomyelitis   . Addison's disease ([place])   . Antiphospholipid antibody syndrome ([place])   . Thymic carcinoma [facility])     Past Surgical History:  Procedure Laterality Date  . CHOLECYSTECTOMY  2000  . COLONOSCOPY  2016    was told to have one in 3 years. From [facility] [place] Dr. Amie Critchley  . ESOPHAGOGASTRODUODENOSCOPY     [place] different doctor least before 2014/15  . NOSE SURGERY     removed polyps from nose around 1996. Said he broke nose in 4 grade and needed surgery on it  . PACEMAKER PLACEMENT    . TONSILLECTOMY AND ADENOIDECTOMY  1959       Family History  Problem Relation Age of Onset  . Depression Mother   . High blood pressure Daughter   . Colon cancer Neg Hx   . Esophageal cancer Neg Hx     Social History   Tobacco Use  . Smoking status: Never Smoker  . Smokeless tobacco: Never Used  Vaping Use  . Vaping Use: Never used  Substance Use Topics  . Alcohol use: Yes    Comment: rare   . Drug use: No    Home Medications Prior to Admission medications   Medication Sig Start Date End Date Taking? Authorizing Provider  busPIRone (BUSPAR) 30 MG tablet Take 30 mg by mouth 2 (two) times daily.    [provider]  carbamazepine (TEGRETOL XR) 200 MG 12 hr tablet Take by mouth. 03/24/20   [provider]  carvedilol (COREG) 3.125 MG tablet Take 3.125 mg by mouth 2 (two) times  daily. 03/03/20   [provider]  clonazePAM (KLONOPIN) 0.5 MG tablet Take 0.5 mg by mouth 2 (two) times daily.    [provider]  DULoxetine (CYMBALTA) 60 MG capsule Take 120 mg by mouth daily.     [provider]  fluticasone (FLONASE) 50 MCG/ACT nasal spray SHAKE LIQUID AND USE 2 SPRAYS IN [facility] NOSTRIL DAILY 05/22/20   Wendling, Crosby Oyster, DO  furosemide (LASIX) 20 MG tablet Take 1 tablet by mouth daily. 02/19/19 11/23/20  [provider]  hydrocortisone (CORTEF) 10 MG tablet TAKE 2 TABLETS IN THE MORNING AND 1 TABLET IN THE EVENING FOR ADDISONS DISEASE 10/04/20   Shelda Pal, DO  lansoprazole (PREVACID) 15 MG capsule Take 1 capsule (15 mg total) by mouth daily. 10/04/20   Shelda Pal, DO  LORazepam (ATIVAN) 1 MG  tablet Take 0.5-1 tablets (0.5-1 mg total) by mouth every 8 (eight) hours as needed for anxiety. 10/15/19   Shelda Pal, DO  losartan (COZAAR) 25 MG tablet Take 25 mg by mouth daily.    [provider]  mirtazapine (REMERON) 45 MG tablet Take by mouth. 02/23/20 11/23/20  [provider]  ondansetron (ZOFRAN) 8 MG tablet Take 1 tablet (8 mg total) by mouth 2 (two) times daily as needed (Nausea or vomiting). 12/01/20   Volanda Napoleon, MD  prochlorperazine (COMPAZINE) 10 MG tablet Take 1 tablet (10 mg total) by mouth every 6 (six) hours as needed (Nausea or vomiting). 12/01/20   Volanda Napoleon, MD  riTUXimab (RITUXAN) 100 MG/10ML injection Inject into the vein every 6 (six) months.    [provider]  rosuvastatin (CRESTOR) 10 MG tablet Take 1 tablet (10 mg total) by mouth at bedtime. 12/11/20   Shelda Pal, DO  traMADol (ULTRAM) 50 MG tablet Take 1 tablet (50 mg total) by mouth every 12 (twelve) hours as needed (Pain). Do not take more than twice in one day. 09/04/20   Shelda Pal, DO  warfarin (COUMADIN) 5 MG tablet Take 1/2 tablet daily except 1  tablet  On Tuesday and Saturday  or  As directed 02/24/20   [provider]    Allergies    Other and Tetracyclines & related  Review of Systems   Review of Systems  Constitutional: Positive for fatigue. Negative for chills and fever.  HENT: Negative for ear pain and sore throat.   Eyes: Negative for pain and visual disturbance.  Respiratory: Negative for cough and shortness of breath.   Cardiovascular: Negative for chest pain and palpitations.  Gastrointestinal: Positive for diarrhea. Negative for abdominal pain, melena, nausea and vomiting.  Genitourinary: Negative for dysuria and hematuria.  Musculoskeletal: Negative for arthralgias and back pain.  Skin: Negative for color change and rash.  Neurological: Positive for weakness. Negative for seizures and syncope.  All other systems  reviewed and are negative.   Physical Exam Updated Vital Signs  ED Triage Vitals  Enc Vitals Group     BP 12/17/20 1328 126/89     Pulse Rate 12/17/20 1328 87     Resp 12/17/20 1328 18     Temp 12/17/20 1328 97.6 F (36.4 C)     Temp Source 12/17/20 1328 Oral     SpO2 12/17/20 1322 100 %     Weight 12/17/20 1326 206 lb (93.4 kg)     Height 12/17/20 1326 6' (1.829 m)     Head Circumference --      Peak Flow --  Pain Score 12/17/20 1326 0     Pain Loc --      Pain Edu? --      Excl. in [place]? --     Physical Exam Vitals and nursing note reviewed.  Constitutional:      General: He is not in acute distress.    Appearance: He is well-developed. He is not ill-appearing.  HENT:     Head: Normocephalic and atraumatic.     Nose: Nose normal.     Mouth/Throat:     Mouth: Mucous membranes are moist.  Eyes:     Extraocular Movements: Extraocular movements intact.     Conjunctiva/sclera: Conjunctivae normal.     Pupils: Pupils are equal, round, and reactive to light.  Cardiovascular:     Rate and Rhythm: Normal rate and regular rhythm.     Pulses: Normal pulses.     Heart sounds: Normal heart sounds. No murmur heard.   Pulmonary:     Effort: Pulmonary effort is normal. No respiratory distress.     Breath sounds: Normal breath sounds.  Abdominal:     Palpations: Abdomen is soft.     Tenderness: There is no abdominal tenderness.  Musculoskeletal:     Cervical back: Normal range of motion and neck supple.  Skin:    General: Skin is warm and dry.     Capillary Refill: Capillary refill takes less than 2 seconds.  Neurological:     General: No focal deficit present.     Mental Status: He is alert and oriented to person, place, and time.     Cranial Nerves: No cranial nerve deficit.     Sensory: No sensory deficit.     Motor: No weakness.     Coordination: Coordination normal.     Comments: 5+ out of 5 strength throughout, normal sensation, no drift, normal  finger-nose-finger     ED Results / Procedures / Treatments   Labs (all labs ordered are listed, but only abnormal results are displayed) Labs Reviewed  CULTURE, BLOOD (ROUTINE X 2)  CULTURE, BLOOD (ROUTINE X 2)  CBC WITH DIFFERENTIAL/PLATELET  COMPREHENSIVE METABOLIC PANEL  LIPASE, BLOOD  URINALYSIS, ROUTINE W REFLEX MICROSCOPIC  PROTIME-INR  LACTIC ACID, PLASMA  LACTIC ACID, PLASMA    EKG None  Radiology DG Chest Portable 1 View  Result Date: 12/17/2020 CLINICAL DATA:  Weakness, fatigue EXAM: PORTABLE CHEST 1 VIEW COMPARISON:  08/04/2020 FINDINGS: Right-sided Port-A-Cath and left-sided single lead cardiac device remain in place. Stable cardiomediastinal contours. Persistent elevation of the right hemidiaphragm. Streaky opacity throughout the right lower lobe with right perihilar scarring/atelectasis. Previously seen pleural based left lung mass is no longer evident. There is some areas of nodularity projecting over the bilateral lung fields. Small right pleural effusion. No pneumothorax. IMPRESSION: 1. Increasing streaky right basilar opacity which may reflect atelectasis or infiltrate. 2. Chronic small right pleural effusion. Electronically Signed   By: Davina Poke D.O.   On: 12/17/2020 14:28    Procedures Procedures   Medications Ordered in ED Medications  sodium chloride 0.9 % bolus 1,000 mL (has no administration in time range)    ED Course  I have reviewed the triage vital signs and the nursing notes.  Pertinent labs & imaging results that were available during my care of the patient were reviewed by me and considered in my medical decision making (see chart for details).    MDM Rules/Calculators/A&P  Robert Kirby is a 70 year old male with history of Addison's disease, thymic cancer on chemotherapy with last treatment over a week ago who presents to the ED with generalized weakness.  Had several episodes of diarrhea today.  Has had a  dry cough for the last week or so.  Denies any fevers or chills.  No urinary symptoms.  No abdominal pain.  Patient has overall unremarkable exam.  He is on room air but occasionally uses oxygen.  Chest x-ray shows questionable infiltrate.  Overall low suspicion for sepsis given his vital signs but will get blood work including blood cultures, lactic acid given that he is on chemotherapy.  We will get a CT scan of his chest to further evaluate for infectious process.  Patient is on Coumadin but supposedly his INR has been below 2 recently and will get PE study.  Patient to be handed off to oncoming ED staff with patient pending reevaluation and work-up.  Hemodynamically stable to my care.  This chart was dictated using voice recognition software.  Despite best efforts to proofread,  errors can occur which can change the documentation meaning.   Final Clinical Impression(s) / ED Diagnoses Final diagnoses:  Weakness  Cough    Rx / DC Orders ED Discharge Orders    None       Lennice Sites, DO 12/17/20 1438

## 2020-12-17 NOTE — ED Provider Notes (Signed)
  Physical Exam  BP 124/83   Pulse 89   Temp 97.6 F (36.4 C) (Oral)   Resp 19   Ht 6' (1.829 m)   Wt 93.4 kg   SpO2 96%   BMI 27.94 kg/m   Physical Exam  ED Course/Procedures     Procedures  MDM     Received care of patient at 3 PM from Dr. Ronnald Nian.  Please see his note for prior history, physical and care.  Briefly this is a 70 year old male with a history of thymic cancer receiving chemotherapy with last treatment 2 weeks ago who presents with concern for fatigue, diarrhea, and cough.  CT PE study was obtained given recent subtherapeutic INR, cancer history and reported generalized weakness.  CT PE study does not show signs of PE, no sign of pneumonia, and is consistent with atelectasis and increase in size of loculated pleural effusion.  Discussed results with patient and wife.  Discussed possible new metastatic disease to T12, however they report he had known lesion there.  He reports feeling significantly better after IV fluids.  Suspect likely viral gastroenteritis.  Covid test ordered, and recommend continued supportive care.      Robert Morgan, MD 12/18/20 [phone]

## 2020-12-18 LAB — SARS CORONAVIRUS 2 (TAT 6-24 HRS): SARS Coronavirus 2: NEGATIVE

## 2020-12-19 ENCOUNTER — Encounter: Payer: Self-pay | Admitting: Hematology & Oncology

## 2020-12-20 ENCOUNTER — Other Ambulatory Visit: Payer: Self-pay | Admitting: *Deleted

## 2020-12-20 MED ORDER — TRAMADOL HCL 50 MG PO TABS: 100.0000 mg | ORAL_TABLET | Freq: Four times a day (QID) | ORAL | 1 refills | Status: DC | PRN

## 2020-12-21 ENCOUNTER — Other Ambulatory Visit: Payer: Self-pay

## 2020-12-21 ENCOUNTER — Encounter (HOSPITAL_BASED_OUTPATIENT_CLINIC_OR_DEPARTMENT_OTHER): Payer: Self-pay

## 2020-12-21 ENCOUNTER — Emergency Department (HOSPITAL_BASED_OUTPATIENT_CLINIC_OR_DEPARTMENT_OTHER)
Admission: EM | Admit: 2020-12-21 | Discharge: 2020-12-21 | Disposition: A | Discharge status: Home / Self Care | Payer: Medicare Other | Attending: Emergency Medicine | Admitting: Emergency Medicine

## 2020-12-21 DIAGNOSIS — H9221 Otorrhagia, right ear: Secondary | ICD-10-CM | POA: Diagnosis present

## 2020-12-21 DIAGNOSIS — F17290 Nicotine dependence, other tobacco product, uncomplicated: Secondary | ICD-10-CM | POA: Diagnosis not present

## 2020-12-21 DIAGNOSIS — H7291 Unspecified perforation of tympanic membrane, right ear: Secondary | ICD-10-CM | POA: Diagnosis not present

## 2020-12-21 DIAGNOSIS — Z950 Presence of cardiac pacemaker: Secondary | ICD-10-CM | POA: Diagnosis not present

## 2020-12-21 DIAGNOSIS — Z7901 Long term (current) use of anticoagulants: Secondary | ICD-10-CM | POA: Diagnosis not present

## 2020-12-21 NOTE — ED Triage Notes (Signed)
Pt cleaned his R ear out with a Q-tip last PM. Pt started having bleeding out of the canal. Pt is on blood thinners.

## 2020-12-21 NOTE — ED Provider Notes (Signed)
[facility] EMERGENCY DEPARTMENT Provider Note   CSN: [id number] Arrival date & time: 12/21/20  1214     History Chief Complaint  Patient presents with  . Ear Problem    Robert Kirby is a 70 y.o. male.  Pt presents to the ED today with right ear bleeding.  The pt said he wears hearing aids and had some wax on the aid when he removed it last night.  He put in a Q-tip to try to remove the wax and started having some bleeding.  He said his hearing is more muffled than nl.  He is on coumadin.  He did not feel any pain when he put the q-tip in his ear.          Past Medical History:  Diagnosis Date  . Addison's disease ([place])   . Antiphospholipid antibody syndrome ([place])   . Arrhythmia    Pacemaker  . Autoimmune encephalomyelitis   . Depression   . GERD (gastroesophageal reflux disease)   . Goals of care, counseling/discussion 11/24/2020  . History of blood transfusion    13 units from a GI Bleed.   Marland Kitchen History of GI bleed   . OSA on CPAP   . Sleep apnea    on CPAP  . 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 Goals of care, counseling/discussion 11/24/2020  . Seizure ([place]) 02/04/2020  . 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 Left-sided low back pain without sciatica 04/02/2019  . Dyspnea on exertion 04/02/2019  . Right inguinal hernia 03/31/2019  . Seasonal allergic rhinitis due to pollen 12/19/2017  . Depression   . OSA on CPAP   . Autoimmune encephalomyelitis   . Addison's disease ([place])   . Antiphospholipid antibody syndrome ([place])   . Thymic carcinoma [facility])     Past Surgical History:  Procedure Laterality Date  . CHOLECYSTECTOMY  2000  . COLONOSCOPY  2016   was told to have one in 3 years. From the [facility] [place] GI Dr. Amie Critchley  . ESOPHAGOGASTRODUODENOSCOPY     [place] different doctor least before 2014/15  . NOSE SURGERY     removed polyps from nose around 1996. Said he broke  nose in 4 grade and needed surgery on it  . PACEMAKER PLACEMENT    . TONSILLECTOMY AND ADENOIDECTOMY  1959       Family History  Problem Relation Age of Onset  . Depression Mother   . High blood pressure Daughter   . Colon cancer Neg Hx   . Esophageal cancer Neg Hx     Social History   Tobacco Use  . Smoking status: Current Some Day Smoker    Types: Cigars  . Smokeless tobacco: Never Used  . Tobacco comment: 1/yr  Vaping Use  . Vaping Use: Never used  Substance Use Topics  . Alcohol use: Yes    Comment: rare   . Drug use: No    Home Medications Prior to Admission medications   Medication Sig Start Date End Date Taking? Authorizing Provider  busPIRone (BUSPAR) 30 MG tablet Take 30 mg by mouth 2 (two) times daily.    [provider]  carbamazepine (TEGRETOL XR) 200 MG 12 hr tablet Take by mouth. 03/24/20   [provider]  carvedilol (COREG) 3.125 MG tablet Take 3.125 mg by mouth 2 (two) times daily. 03/03/20   [provider]  clonazePAM Bobbye [place])  0.5 MG tablet Take 0.5 mg by mouth 2 (two) times daily.    [provider]  DULoxetine (CYMBALTA) 60 MG capsule Take 120 mg by mouth daily.     [provider]  fluticasone (FLONASE) 50 MCG/ACT nasal spray SHAKE LIQUID AND USE 2 SPRAYS IN [facility] NOSTRIL DAILY 05/22/20   Wendling, Crosby Oyster, DO  furosemide (LASIX) 20 MG tablet Take 1 tablet by mouth daily. 02/19/19 11/23/20  [provider]  hydrocortisone (CORTEF) 10 MG tablet TAKE 2 TABLETS IN THE MORNING AND 1 TABLET IN THE EVENING FOR ADDISONS DISEASE 10/04/20   Shelda Pal, DO  lansoprazole (PREVACID) 15 MG capsule Take 1 capsule (15 mg total) by mouth daily. 10/04/20   Shelda Pal, DO  LORazepam (ATIVAN) 1 MG tablet Take 0.5-1 tablets (0.5-1 mg total) by mouth every 8 (eight) hours as needed for anxiety. 10/15/19   Shelda Pal, DO  losartan (COZAAR) 25 MG tablet Take 25 mg by mouth daily.     [provider]  mirtazapine (REMERON) 45 MG tablet Take by mouth. 02/23/20 11/23/20  [provider]  ondansetron (ZOFRAN) 8 MG tablet Take 1 tablet (8 mg total) by mouth 2 (two) times daily as needed (Nausea or vomiting). 12/01/20   Volanda Napoleon, MD  prochlorperazine (COMPAZINE) 10 MG tablet Take 1 tablet (10 mg total) by mouth every 6 (six) hours as needed (Nausea or vomiting). 12/01/20   Volanda Napoleon, MD  riTUXimab (RITUXAN) 100 MG/10ML injection Inject into the vein every 6 (six) months.    [provider]  rosuvastatin (CRESTOR) 10 MG tablet Take 1 tablet (10 mg total) by mouth at bedtime. 12/11/20   Shelda Pal, DO  traMADol (ULTRAM) 50 MG tablet Take 2 tablets (100 mg total) by mouth every 6 (six) hours as needed. 12/20/20   Volanda Napoleon, MD  warfarin (COUMADIN) 5 MG tablet Take 1/2 tablet daily except 1  tablet  On Tuesday and Saturday  or  As directed 02/24/20   [provider]    Allergies    Other and Tetracyclines & related  Review of Systems   Review of Systems  HENT:       Bleeding from right tm  All other systems reviewed and are negative.   Physical Exam Updated Vital Signs BP (!) 146/85   Pulse 99   Temp 98.1 F (36.7 C) (Oral)   Resp 18   Ht 6' (1.829 m)   Wt 93.9 kg   SpO2 92%   BMI 28.07 kg/m   Physical Exam Vitals and nursing note reviewed.  Constitutional:      Appearance: Normal appearance.  HENT:     Head: Normocephalic and atraumatic.     Comments: I am unable to visualize the right tm due to blood in the canal.  I suspect pt has ruptured his right tm.      Left Ear: External ear normal.     Nose: Nose normal.     Mouth/Throat:     Mouth: Mucous membranes are moist.     Pharynx: Oropharynx is clear.  Eyes:     Extraocular Movements: Extraocular movements intact.     Conjunctiva/sclera: Conjunctivae normal.     Pupils: Pupils are equal, round, and reactive to light.  Cardiovascular:     Rate  and Rhythm: Normal rate and regular rhythm.     Pulses: Normal pulses.     Heart sounds: Normal heart sounds.  Pulmonary:  Effort: Pulmonary effort is normal.     Breath sounds: Normal breath sounds.  Abdominal:     General: Abdomen is flat. Bowel sounds are normal.     Palpations: Abdomen is soft.  Musculoskeletal:        General: Normal range of motion.     Cervical back: Normal range of motion and neck supple.  Neurological:     Mental Status: He is alert.     ED Results / Procedures / Treatments   Labs (all labs ordered are listed, but only abnormal results are displayed) Labs Reviewed - No data to display  EKG None  Radiology No results found.  Procedures Procedures   Medications Ordered in ED Medications - No data to display  ED Course  I have reviewed the triage vital signs and the nursing notes.  Pertinent labs & imaging results that were available during my care of the patient were reviewed by me and considered in my medical decision making (see chart for details).    MDM Rules/Calculators/A&P                           Pt referred to ENT.  He is instructed to avoid putting things in his ears.  Return if worse.   Final Clinical Impression(s) / ED Diagnoses Final diagnoses:  Perforation of right tympanic membrane    Rx / DC Orders ED Discharge Orders         Ordered    Ambulatory referral to ENT        12/21/20 [place], Givanni Staron, MD 12/21/20 1351

## 2020-12-22 ENCOUNTER — Inpatient Hospital Stay (HOSPITAL_BASED_OUTPATIENT_CLINIC_OR_DEPARTMENT_OTHER): Payer: Medicare Other | Admitting: Hematology & Oncology

## 2020-12-22 ENCOUNTER — Encounter: Payer: Self-pay | Admitting: Hematology & Oncology

## 2020-12-22 ENCOUNTER — Inpatient Hospital Stay: Payer: Medicare Other

## 2020-12-22 ENCOUNTER — Inpatient Hospital Stay: Payer: Medicare Other | Attending: Hematology & Oncology

## 2020-12-22 VITALS — BP 136/88 | HR 90 | Temp 98.0°F | Resp 17 | Wt 209.0 lb

## 2020-12-22 DIAGNOSIS — C37 Malignant neoplasm of thymus: Secondary | ICD-10-CM

## 2020-12-22 DIAGNOSIS — D6861 Antiphospholipid syndrome: Secondary | ICD-10-CM

## 2020-12-22 DIAGNOSIS — Z79899 Other long term (current) drug therapy: Secondary | ICD-10-CM | POA: Insufficient documentation

## 2020-12-22 DIAGNOSIS — Z5111 Encounter for antineoplastic chemotherapy: Secondary | ICD-10-CM | POA: Insufficient documentation

## 2020-12-22 DIAGNOSIS — Z7189 Other specified counseling: Secondary | ICD-10-CM

## 2020-12-22 DIAGNOSIS — C7951 Secondary malignant neoplasm of bone: Secondary | ICD-10-CM | POA: Diagnosis not present

## 2020-12-22 DIAGNOSIS — Z5181 Encounter for therapeutic drug level monitoring: Secondary | ICD-10-CM | POA: Diagnosis not present

## 2020-12-22 DIAGNOSIS — R791 Abnormal coagulation profile: Secondary | ICD-10-CM | POA: Diagnosis not present

## 2020-12-22 DIAGNOSIS — Z7901 Long term (current) use of anticoagulants: Secondary | ICD-10-CM | POA: Diagnosis not present

## 2020-12-22 LAB — CBC WITH DIFFERENTIAL (CANCER CENTER ONLY)
Abs Immature Granulocytes: 0.04 10*3/uL (ref 0.00–0.07)
Basophils Absolute: 0 10*3/uL (ref 0.0–0.1)
Basophils Relative: 0 %
Eosinophils Absolute: 0 10*3/uL (ref 0.0–0.5)
Eosinophils Relative: 1 %
HCT: 39.1 % (ref 39.0–52.0)
Hemoglobin: 12.9 g/dL — ABNORMAL LOW (ref 13.0–17.0)
Immature Granulocytes: 1 %
Lymphocytes Relative: 12 %
Lymphs Abs: 0.7 10*3/uL (ref 0.7–4.0)
MCH: 30.5 pg (ref 26.0–34.0)
MCHC: 33 g/dL (ref 30.0–36.0)
MCV: 92.4 fL (ref 80.0–100.0)
Monocytes Absolute: 0.9 10*3/uL (ref 0.1–1.0)
Monocytes Relative: 16 %
Neutro Abs: 4.2 10*3/uL (ref 1.7–7.7)
Neutrophils Relative %: 70 %
Platelet Count: 425 10*3/uL — ABNORMAL HIGH (ref 150–400)
RBC: 4.23 MIL/uL (ref 4.22–5.81)
RDW: 15.6 % — ABNORMAL HIGH (ref 11.5–15.5)
WBC Count: 6 10*3/uL (ref 4.0–10.5)
nRBC: 0 % (ref 0.0–0.2)

## 2020-12-22 LAB — PROTIME-INR
INR: 1.9 — ABNORMAL HIGH (ref 0.8–1.2)
Prothrombin Time: 21.4 seconds — ABNORMAL HIGH (ref 11.4–15.2)

## 2020-12-22 LAB — CULTURE, BLOOD (ROUTINE X 2)
Culture: NO GROWTH
Culture: NO GROWTH
Special Requests: ADEQUATE
Special Requests: ADEQUATE

## 2020-12-22 LAB — CMP (CANCER CENTER ONLY)
ALT: 32 U/L (ref 0–44)
AST: 26 U/L (ref 15–41)
Albumin: 3.9 g/dL (ref 3.5–5.0)
Alkaline Phosphatase: 80 U/L (ref 38–126)
Anion gap: 6 (ref 5–15)
BUN: 18 mg/dL (ref 8–23)
CO2: 29 mmol/L (ref 22–32)
Calcium: 9 mg/dL (ref 8.9–10.3)
Chloride: 104 mmol/L (ref 98–111)
Creatinine: 0.99 mg/dL (ref 0.61–1.24)
GFR, Estimated: >60 mL/min (ref >60)
Glucose, Bld: 83 mg/dL (ref 70–99)
Potassium: 4.4 mmol/L (ref 3.5–5.1)
Sodium: 139 mmol/L (ref 135–145)
Total Bilirubin: 0.2 mg/dL — ABNORMAL LOW (ref 0.3–1.2)
Total Protein: 6.7 g/dL (ref 6.5–8.1)

## 2020-12-22 MED ORDER — SODIUM CHLORIDE 0.9 % IV SOLN
2200.0000 mg | Freq: Once | INTRAVENOUS | Status: AC
Start: 2020-12-22 — End: 2020-12-22
  Administered 2020-12-22: 2200 mg via INTRAVENOUS
  Filled 2020-12-22: qty 52.6

## 2020-12-22 MED ORDER — SODIUM CHLORIDE 0.9 % IV SOLN
Freq: Once | INTRAVENOUS | Status: AC
  Filled 2020-12-22: qty 250

## 2020-12-22 MED ORDER — PROCHLORPERAZINE MALEATE 10 MG PO TABS
ORAL_TABLET | ORAL | Status: AC
  Filled 2020-12-22: qty 1

## 2020-12-22 MED ORDER — HEPARIN SOD (PORK) LOCK FLUSH 100 UNIT/ML IV SOLN
500.0000 [IU] | Freq: Once | INTRAVENOUS | Status: AC | PRN
  Administered 2020-12-22: 500 [IU]
  Filled 2020-12-22: qty 5

## 2020-12-22 MED ORDER — PROCHLORPERAZINE MALEATE 10 MG PO TABS
10.0000 mg | ORAL_TABLET | Freq: Once | ORAL | Status: AC
  Administered 2020-12-22: 10 mg via ORAL

## 2020-12-22 MED ORDER — SODIUM CHLORIDE 0.9% FLUSH
10.0000 mL | INTRAVENOUS | Status: DC | PRN
  Administered 2020-12-22: 10 mL
  Filled 2020-12-22: qty 10

## 2020-12-22 NOTE — Progress Notes (Signed)
Hematology and Oncology Follow Up Visit  Robert Kirby [id number] [birth year] 70 y.o. 12/22/2020   Principle Diagnosis:   Metastatic Thymic Cancer ([id number]) -- recurrent  Auto-immune limbic encephalopathy  Anti-cardiolipin Ab Syndrome  Current Therapy:    Gemzar 1000 mg/m2 q week (2 week on/ 1 week off)  -- sp cycle #1 - start on 11/29/2020  Xgeva 120 mg sq every 3 months  - next dose on 02/2021  Rituxan -- given at [facility] -- q 6 months  Coumadin to maintain INR 2.5-3     Interim History:  Robert Kirby is back for follow-up.  We started him on gemcitabine for the recurrent thymic cancer.  He tolerated the first cycle fairly well.  He has had no problems with diarrhea.  He has had a little bit of nausea but the antiemetics seem to work quite nicely.  He has had no cough or shortness of breath.  He has had no chest wall pain.  When he bends over and gets back up, he does have a little bit of shortness of breath because of diaphragmatic damage from past radiation.  He has had no leg swelling.  He has had no rashes.  He has had no headache.  He does have the autoimmune limbic encephalopathy.  He gets Rituxan for this every 6 months.  Overall, I would say his performance status is by ECOG 1.     Medications:  Current Outpatient Medications:  .  busPIRone (BUSPAR) 30 MG tablet, Take 30 mg by mouth 2 (two) times daily., Disp: , Rfl:  .  carbamazepine (TEGRETOL XR) 200 MG 12 hr tablet, Take by mouth., Disp: , Rfl:  .  carvedilol (COREG) 3.125 MG tablet, Take 3.125 mg by mouth 2 (two) times daily., Disp: , Rfl:  .  clonazePAM (KLONOPIN) 0.5 MG tablet, Take 0.5 mg by mouth 2 (two) times daily., Disp: , Rfl:  .  DULoxetine (CYMBALTA) 60 MG capsule, Take 120 mg by mouth daily. , Disp: , Rfl:  .  fluticasone (FLONASE) 50 MCG/ACT nasal spray, SHAKE LIQUID AND USE 2 SPRAYS IN EACH NOSTRIL DAILY, Disp: 48 g, Rfl: 2 .  furosemide (LASIX) 20 MG tablet, Take 1 tablet by mouth daily., Disp: ,  Rfl:  .  hydrocortisone (CORTEF) 10 MG tablet, TAKE 2 TABLETS IN THE MORNING AND 1 TABLET IN THE EVENING FOR ADDISONS DISEASE, Disp: 270 tablet, Rfl: 3 .  lansoprazole (PREVACID) 15 MG capsule, Take 1 capsule (15 mg total) by mouth daily., Disp: 90 capsule, Rfl: 3 .  LORazepam (ATIVAN) 1 MG tablet, Take 0.5-1 tablets (0.5-1 mg total) by mouth every 8 (eight) hours as needed for anxiety., Disp: 30 tablet, Rfl: 0 .  losartan (COZAAR) 25 MG tablet, Take 25 mg by mouth daily., Disp: , Rfl:  .  mirtazapine (REMERON) 45 MG tablet, Take by mouth., Disp: , Rfl:  .  ondansetron (ZOFRAN) 8 MG tablet, Take 1 tablet (8 mg total) by mouth 2 (two) times daily as needed (Nausea or vomiting)., Disp: 30 tablet, Rfl: 1 .  prochlorperazine (COMPAZINE) 10 MG tablet, Take 1 tablet (10 mg total) by mouth every 6 (six) hours as needed (Nausea or vomiting)., Disp: 30 tablet, Rfl: 1 .  riTUXimab (RITUXAN) 100 MG/10ML injection, Inject into the vein every 6 (six) months., Disp: , Rfl:  .  rosuvastatin (CRESTOR) 10 MG tablet, Take 1 tablet (10 mg total) by mouth at bedtime., Disp: 90 tablet, Rfl: 3 .  traMADol (ULTRAM) 50 MG tablet, Take  2 tablets (100 mg total) by mouth every 6 (six) hours as needed., Disp: 60 tablet, Rfl: 1 .  warfarin (COUMADIN) 5 MG tablet, Take 1/2 tablet daily except 1  tablet  On Tuesday and Saturday  or  As directed, Disp: , Rfl:   Allergies:  Allergies  Allergen Reactions  . Other Other (See Comments), Nausea And Vomiting and Nausea Only  . Tetracyclines & Related Nausea Only    Past Medical History, Surgical history, Social history, and Family History were reviewed and updated.  Review of Systems: Review of Systems  Constitutional: Negative.   HENT:  Negative.   Eyes: Negative.   Respiratory: Negative.   Cardiovascular: Negative.   Gastrointestinal: Negative.   Endocrine: Negative.   Genitourinary: Negative.    Musculoskeletal: Negative.   Skin: Negative.   Neurological: Negative.    Hematological: Negative.   Psychiatric/Behavioral: Negative.     Physical Exam:  vitals were not taken for this visit.   Wt Readings from Last 3 Encounters:  12/21/20 207 lb (93.9 kg)  12/17/20 206 lb (93.4 kg)  11/24/20 209 lb (94.8 kg)    Physical Exam Vitals reviewed.  HENT:     Head: Normocephalic and atraumatic.  Eyes:     Pupils: Pupils are equal, round, and reactive to light.  Cardiovascular:     Rate and Rhythm: Normal rate and regular rhythm.     Heart sounds: Normal heart sounds.  Pulmonary:     Effort: Pulmonary effort is normal.     Breath sounds: Normal breath sounds.  Abdominal:     General: Bowel sounds are normal.     Palpations: Abdomen is soft.  Musculoskeletal:        General: No tenderness or deformity. Normal range of motion.     Cervical back: Normal range of motion.  Lymphadenopathy:     Cervical: No cervical adenopathy.  Skin:    General: Skin is warm and dry.     Findings: No erythema or rash.  Neurological:     Mental Status: He is alert and oriented to person, place, and time.  Psychiatric:        Behavior: Behavior normal.        Thought Content: Thought content normal.        Judgment: Judgment normal.    Lab Results  Component Value Date   WBC 6.0 12/22/2020   HGB 12.9 (L) 12/22/2020   HCT 39.1 12/22/2020   MCV 92.4 12/22/2020   PLT 425 (H) 12/22/2020     Chemistry      Component Value Date/Time   NA 139 12/22/2020 0945   K 4.4 12/22/2020 0945   CL 104 12/22/2020 0945   CO2 29 12/22/2020 0945   BUN 18 12/22/2020 0945   CREATININE 0.99 12/22/2020 0945      Component Value Date/Time   CALCIUM 9.0 12/22/2020 0945   ALKPHOS 80 12/22/2020 0945   AST 26 12/22/2020 0945   ALT 32 12/22/2020 0945   BILITOT 0.2 (L) 12/22/2020 0945      Impression and Plan: Robert Kirby is a very nice 70 year old male. He has a history of thymic carcinoma.  Is been 7 years since he had problems with thymic cancer.  He has been on treatment in  the past with carboplatinum/Taxol.  He now has a recurrence.  We are now treating him with gemcitabine.  Hopefully, he will show response.  I forgot to mention that we did give him a dose of Xgeva  already.  He gets this every 3 months.  We will plan for second cycle to start today.  I will plan for a third cycle in April and then we will repeat his scans.  I know that he will have a wonderful Easter with his family.     Volanda Napoleon, MD 4/1/202210:59 AM

## 2020-12-22 NOTE — Patient Instructions (Signed)
[facility] Discharge Instructions for Patients Receiving Chemotherapy  Today you received the following chemotherapy agents Gemzar  To help prevent nausea and vomiting after your treatment, we encourage you to take your nausea medication as prescribed by MD.   If you develop nausea and vomiting that is not controlled by your nausea medication, call the clinic.   BELOW ARE SYMPTOMS THAT SHOULD BE REPORTED IMMEDIATELY:  *FEVER GREATER THAN 100.5 F  *CHILLS WITH OR WITHOUT FEVER  NAUSEA AND VOMITING THAT IS NOT CONTROLLED WITH YOUR NAUSEA MEDICATION  *UNUSUAL SHORTNESS OF BREATH  *UNUSUAL BRUISING OR BLEEDING  TENDERNESS IN MOUTH AND THROAT WITH OR WITHOUT PRESENCE OF ULCERS  *URINARY PROBLEMS  *BOWEL PROBLEMS  UNUSUAL RASH Items with * indicate a potential emergency and should be followed up as soon as possible.  Feel free to call the clinic should you have any questions or concerns. The clinic phone number is [phone].  Please show the [facility] at check-in to the Emergency Department and triage nurse.

## 2020-12-25 ENCOUNTER — Other Ambulatory Visit: Payer: Self-pay | Admitting: *Deleted

## 2020-12-25 DIAGNOSIS — C37 Malignant neoplasm of thymus: Secondary | ICD-10-CM

## 2020-12-25 DIAGNOSIS — D6861 Antiphospholipid syndrome: Secondary | ICD-10-CM

## 2020-12-27 DIAGNOSIS — G4733 Obstructive sleep apnea (adult) (pediatric): Secondary | ICD-10-CM

## 2020-12-29 ENCOUNTER — Other Ambulatory Visit: Payer: Self-pay

## 2020-12-29 ENCOUNTER — Inpatient Hospital Stay: Payer: Medicare Other

## 2020-12-29 VITALS — BP 118/71 | HR 88 | Temp 98.3°F | Resp 16

## 2020-12-29 DIAGNOSIS — C37 Malignant neoplasm of thymus: Secondary | ICD-10-CM

## 2020-12-29 DIAGNOSIS — D6861 Antiphospholipid syndrome: Secondary | ICD-10-CM

## 2020-12-29 DIAGNOSIS — Z79899 Other long term (current) drug therapy: Secondary | ICD-10-CM | POA: Diagnosis not present

## 2020-12-29 DIAGNOSIS — C7951 Secondary malignant neoplasm of bone: Secondary | ICD-10-CM | POA: Diagnosis not present

## 2020-12-29 DIAGNOSIS — Z7901 Long term (current) use of anticoagulants: Secondary | ICD-10-CM | POA: Diagnosis not present

## 2020-12-29 DIAGNOSIS — Z5111 Encounter for antineoplastic chemotherapy: Secondary | ICD-10-CM | POA: Diagnosis not present

## 2020-12-29 LAB — CBC WITH DIFFERENTIAL (CANCER CENTER ONLY)
Abs Immature Granulocytes: 0.06 10*3/uL (ref 0.00–0.07)
Basophils Absolute: 0 10*3/uL (ref 0.0–0.1)
Basophils Relative: 1 %
Eosinophils Absolute: 0 10*3/uL (ref 0.0–0.5)
Eosinophils Relative: 0 %
HCT: 39.5 % (ref 39.0–52.0)
Hemoglobin: 13.2 g/dL (ref 13.0–17.0)
Immature Granulocytes: 2 %
Lymphocytes Relative: 23 %
Lymphs Abs: 0.9 10*3/uL (ref 0.7–4.0)
MCH: 30.9 pg (ref 26.0–34.0)
MCHC: 33.4 g/dL (ref 30.0–36.0)
MCV: 92.5 fL (ref 80.0–100.0)
Monocytes Absolute: 0.8 10*3/uL (ref 0.1–1.0)
Monocytes Relative: 21 %
Neutro Abs: 2.1 10*3/uL (ref 1.7–7.7)
Neutrophils Relative %: 53 %
Platelet Count: 388 10*3/uL (ref 150–400)
RBC: 4.27 MIL/uL (ref 4.22–5.81)
RDW: 15.4 % (ref 11.5–15.5)
WBC Count: 4 10*3/uL (ref 4.0–10.5)
nRBC: 1.5 % — ABNORMAL HIGH (ref 0.0–0.2)

## 2020-12-29 LAB — COMPREHENSIVE METABOLIC PANEL
ALT: 57 U/L — ABNORMAL HIGH (ref 0–44)
AST: 44 U/L — ABNORMAL HIGH (ref 15–41)
Albumin: 4.1 g/dL (ref 3.5–5.0)
Alkaline Phosphatase: 95 U/L (ref 38–126)
Anion gap: 7 (ref 5–15)
BUN: 19 mg/dL (ref 8–23)
CO2: 30 mmol/L (ref 22–32)
Calcium: 9.8 mg/dL (ref 8.9–10.3)
Chloride: 99 mmol/L (ref 98–111)
Creatinine, Ser: 1.02 mg/dL (ref 0.61–1.24)
GFR, Estimated: >60 mL/min (ref >60)
Glucose, Bld: 110 mg/dL — ABNORMAL HIGH (ref 70–99)
Potassium: 4.8 mmol/L (ref 3.5–5.1)
Sodium: 136 mmol/L (ref 135–145)
Total Bilirubin: 0.3 mg/dL (ref 0.3–1.2)
Total Protein: 6.9 g/dL (ref 6.5–8.1)

## 2020-12-29 LAB — PROTIME-INR
INR: 2 — ABNORMAL HIGH (ref 0.8–1.2)
Prothrombin Time: 21.6 seconds — ABNORMAL HIGH (ref 11.4–15.2)

## 2020-12-29 MED ORDER — PROCHLORPERAZINE MALEATE 10 MG PO TABS
10.0000 mg | ORAL_TABLET | Freq: Once | ORAL | Status: AC
  Administered 2020-12-29: 10 mg via ORAL

## 2020-12-29 MED ORDER — SODIUM CHLORIDE 0.9 % IV SOLN
Freq: Once | INTRAVENOUS | Status: AC
  Filled 2020-12-29: qty 250

## 2020-12-29 MED ORDER — PROCHLORPERAZINE MALEATE 10 MG PO TABS
ORAL_TABLET | ORAL | Status: AC
  Filled 2020-12-29: qty 1

## 2020-12-29 MED ORDER — SODIUM CHLORIDE 0.9 % IV SOLN
2200.0000 mg | Freq: Once | INTRAVENOUS | Status: AC
  Administered 2020-12-29: 2200 mg via INTRAVENOUS
  Filled 2020-12-29: qty 52.6

## 2020-12-29 NOTE — Patient Instructions (Signed)
Gemcitabine injection What is this medicine? GEMCITABINE (jem SYE ta been) is a chemotherapy drug. This medicine is used to treat many types of cancer like breast cancer, lung cancer, pancreatic cancer, and ovarian cancer. This medicine may be used for other purposes; ask your health care provider or pharmacist if you have questions. COMMON BRAND NAME(S): Gemzar, Infugem What should I tell my health care provider before I take this medicine? They need to know if you have any of these conditions:  blood disorders  infection  kidney disease  liver disease  lung or breathing disease, like asthma  recent or ongoing radiation therapy  an unusual or allergic reaction to gemcitabine, other chemotherapy, other medicines, foods, dyes, or preservatives  pregnant or trying to get pregnant  breast-feeding How should I use this medicine? This drug is given as an infusion into a vein. It is administered in a hospital or clinic by a specially trained health care professional. Talk to your pediatrician regarding the use of this medicine in children. Special care may be needed. Overdosage: If you think you have taken too much of this medicine contact a poison control center or emergency room at once. NOTE: This medicine is only for you. Do not share this medicine with others. What if I miss a dose? It is important not to miss your dose. Call your doctor or health care professional if you are unable to keep an appointment. What may interact with this medicine?  medicines to increase blood counts like filgrastim, pegfilgrastim, sargramostim  some other chemotherapy drugs like cisplatin  vaccines Talk to your doctor or health care professional before taking any of these medicines:  acetaminophen  aspirin  ibuprofen  ketoprofen  naproxen This list may not describe all possible interactions. Give your health care provider a list of all the medicines, herbs, non-prescription drugs, or  dietary supplements you use. Also tell them if you smoke, drink alcohol, or use illegal drugs. Some items may interact with your medicine. What should I watch for while using this medicine? Visit your doctor for checks on your progress. This drug may make you feel generally unwell. This is not uncommon, as chemotherapy can affect healthy cells as well as cancer cells. Report any side effects. Continue your course of treatment even though you feel ill unless your doctor tells you to stop. In some cases, you may be given additional medicines to help with side effects. Follow all directions for their use. Call your doctor or health care professional for advice if you get a fever, chills or sore throat, or other symptoms of a cold or flu. Do not treat yourself. This drug decreases your body's ability to fight infections. Try to avoid being around people who are sick. This medicine may increase your risk to bruise or bleed. Call your doctor or health care professional if you notice any unusual bleeding. Be careful brushing and flossing your teeth or using a toothpick because you may get an infection or bleed more easily. If you have any dental work done, tell your dentist you are receiving this medicine. Avoid taking products that contain aspirin, acetaminophen, ibuprofen, naproxen, or ketoprofen unless instructed by your doctor. These medicines may hide a fever. Do not become pregnant while taking this medicine or for 6 months after stopping it. Women should inform their doctor if they wish to become pregnant or think they might be pregnant. Men should not father a child while taking this medicine and for 3 months after stopping it.  There is a potential for serious side effects to an unborn child. Talk to your health care professional or pharmacist for more information. Do not breast-feed an infant while taking this medicine or for at least 1 week after stopping it. Men should inform their doctors if they wish  to father a child. This medicine may lower sperm counts. Talk with your doctor or health care professional if you are concerned about your fertility. What side effects may I notice from receiving this medicine? Side effects that you should report to your doctor or health care professional as soon as possible:  allergic reactions like skin rash, itching or hives, swelling of the face, lips, or tongue  breathing problems  pain, redness, or irritation at site where injected  signs and symptoms of a dangerous change in heartbeat or heart rhythm like chest pain; dizziness; fast or irregular heartbeat; palpitations; feeling faint or lightheaded, falls; breathing problems  signs of decreased platelets or bleeding - bruising, pinpoint red spots on the skin, black, tarry stools, blood in the urine  signs of decreased red blood cells - unusually weak or tired, feeling faint or lightheaded, falls  signs of infection - fever or chills, cough, sore throat, pain or difficulty passing urine  signs and symptoms of kidney injury like trouble passing urine or change in the amount of urine  signs and symptoms of liver injury like dark yellow or brown urine; general ill feeling or flu-like symptoms; light-colored stools; loss of appetite; nausea; right upper belly pain; unusually weak or tired; yellowing of the eyes or skin  swelling of ankles, feet, hands Side effects that usually do not require medical attention (report to your doctor or health care professional if they continue or are bothersome):  constipation  diarrhea  hair loss  loss of appetite  nausea  rash  vomiting This list may not describe all possible side effects. Call your doctor for medical advice about side effects. You may report side effects to FDA at 1-800-FDA-1088. Where should I keep my medicine? This drug is given in a hospital or clinic and will not be stored at home. NOTE: This sheet is a summary. It may not cover all  possible information. If you have questions about this medicine, talk to your doctor, pharmacist, or health care provider.  2021 Elsevier/Gold Standard (2017-12-03 18:06:11)

## 2021-01-04 ENCOUNTER — Ambulatory Visit: Payer: Medicare Other

## 2021-01-08 ENCOUNTER — Ambulatory Visit: Payer: Medicare Other | Attending: Internal Medicine

## 2021-01-08 DIAGNOSIS — Z23 Encounter for immunization: Secondary | ICD-10-CM

## 2021-01-08 NOTE — Progress Notes (Signed)
   Covid-19 Vaccination Clinic  Name:  Robert Kirby    MRN: [id number] DOB: 14-Aug-1951  01/08/2021  Robert Kirby was observed post Covid-19 immunization for 15 minutes without incident. He was provided with Vaccine Information Sheet and instruction to access the V-Safe system. Administered by Georgina Snell, RPh.  Robert Kirby was instructed to call 911 with any severe reactions post vaccine: Marland Kitchen Difficulty breathing  . Swelling of face and throat  . A fast heartbeat  . A bad rash all over body  . Dizziness and weakness   Immunizations Administered    Name Date Dose VIS Date Route   PFIZER Comrnaty(Gray TOP) Covid-19 Vaccine 01/08/2021 10:01 AM 0.3 mL 08/31/2020 Intramuscular   Manufacturer: [employer], [employer]   Lot: [id number]   NDC: [phone]

## 2021-01-10 ENCOUNTER — Other Ambulatory Visit: Payer: Medicare Other

## 2021-01-10 ENCOUNTER — Ambulatory Visit: Payer: Medicare Other | Admitting: Hematology & Oncology

## 2021-01-10 ENCOUNTER — Ambulatory Visit: Payer: Medicare Other

## 2021-01-11 ENCOUNTER — Other Ambulatory Visit (HOSPITAL_BASED_OUTPATIENT_CLINIC_OR_DEPARTMENT_OTHER): Payer: Self-pay

## 2021-01-11 DIAGNOSIS — Z23 Encounter for immunization: Secondary | ICD-10-CM | POA: Diagnosis not present

## 2021-01-11 MED ORDER — PFIZER-BIONT COVID-19 VAC-TRIS 30 MCG/0.3ML IM SUSP
INTRAMUSCULAR | 0 refills | Status: DC
Start: 2021-01-08 — End: 2021-02-07
  Filled 2021-01-11: qty 0.3, 1d supply, fill #0

## 2021-01-12 ENCOUNTER — Other Ambulatory Visit: Payer: Self-pay

## 2021-01-12 ENCOUNTER — Inpatient Hospital Stay (HOSPITAL_BASED_OUTPATIENT_CLINIC_OR_DEPARTMENT_OTHER): Payer: Medicare Other | Admitting: Hematology & Oncology

## 2021-01-12 ENCOUNTER — Inpatient Hospital Stay: Payer: Medicare Other

## 2021-01-12 ENCOUNTER — Other Ambulatory Visit (HOSPITAL_BASED_OUTPATIENT_CLINIC_OR_DEPARTMENT_OTHER): Payer: Self-pay

## 2021-01-12 ENCOUNTER — Encounter: Payer: Self-pay | Admitting: Hematology & Oncology

## 2021-01-12 VITALS — BP 109/88 | HR 92 | Temp 98.1°F | Resp 18 | Wt 207.0 lb

## 2021-01-12 DIAGNOSIS — D6861 Antiphospholipid syndrome: Secondary | ICD-10-CM

## 2021-01-12 DIAGNOSIS — C37 Malignant neoplasm of thymus: Secondary | ICD-10-CM

## 2021-01-12 DIAGNOSIS — C7951 Secondary malignant neoplasm of bone: Secondary | ICD-10-CM | POA: Diagnosis not present

## 2021-01-12 DIAGNOSIS — Z79899 Other long term (current) drug therapy: Secondary | ICD-10-CM | POA: Diagnosis not present

## 2021-01-12 DIAGNOSIS — Z7901 Long term (current) use of anticoagulants: Secondary | ICD-10-CM | POA: Diagnosis not present

## 2021-01-12 DIAGNOSIS — Z5111 Encounter for antineoplastic chemotherapy: Secondary | ICD-10-CM | POA: Diagnosis not present

## 2021-01-12 LAB — CBC WITH DIFFERENTIAL (CANCER CENTER ONLY)
Abs Immature Granulocytes: 0.05 10*3/uL (ref 0.00–0.07)
Basophils Absolute: 0 10*3/uL (ref 0.0–0.1)
Basophils Relative: 0 %
Eosinophils Absolute: 0.1 10*3/uL (ref 0.0–0.5)
Eosinophils Relative: 1 %
HCT: 37.5 % — ABNORMAL LOW (ref 39.0–52.0)
Hemoglobin: 12.3 g/dL — ABNORMAL LOW (ref 13.0–17.0)
Immature Granulocytes: 1 %
Lymphocytes Relative: 10 %
Lymphs Abs: 0.7 10*3/uL (ref 0.7–4.0)
MCH: 30.9 pg (ref 26.0–34.0)
MCHC: 32.8 g/dL (ref 30.0–36.0)
MCV: 94.2 fL (ref 80.0–100.0)
Monocytes Absolute: 1 10*3/uL (ref 0.1–1.0)
Monocytes Relative: 14 %
Neutro Abs: 5.7 10*3/uL (ref 1.7–7.7)
Neutrophils Relative %: 74 %
Platelet Count: 284 10*3/uL (ref 150–400)
RBC: 3.98 MIL/uL — ABNORMAL LOW (ref 4.22–5.81)
RDW: 17.5 % — ABNORMAL HIGH (ref 11.5–15.5)
WBC Count: 7.5 10*3/uL (ref 4.0–10.5)
nRBC: 0 % (ref 0.0–0.2)

## 2021-01-12 LAB — CMP (CANCER CENTER ONLY)
ALT: 24 U/L (ref 0–44)
AST: 20 U/L (ref 15–41)
Albumin: 4.1 g/dL (ref 3.5–5.0)
Alkaline Phosphatase: 73 U/L (ref 38–126)
Anion gap: 4 — ABNORMAL LOW (ref 5–15)
BUN: 22 mg/dL (ref 8–23)
CO2: 30 mmol/L (ref 22–32)
Calcium: 9.3 mg/dL (ref 8.9–10.3)
Chloride: 104 mmol/L (ref 98–111)
Creatinine: 0.89 mg/dL (ref 0.61–1.24)
GFR, Estimated: >60 mL/min (ref >60)
Glucose, Bld: 85 mg/dL (ref 70–99)
Potassium: 4.3 mmol/L (ref 3.5–5.1)
Sodium: 138 mmol/L (ref 135–145)
Total Bilirubin: 0.3 mg/dL (ref 0.3–1.2)
Total Protein: 6.8 g/dL (ref 6.5–8.1)

## 2021-01-12 LAB — PROTIME-INR
INR: 2.1 — ABNORMAL HIGH (ref 0.8–1.2)
Prothrombin Time: 23.2 seconds — ABNORMAL HIGH (ref 11.4–15.2)

## 2021-01-12 LAB — LACTATE DEHYDROGENASE: LDH: 197 U/L — ABNORMAL HIGH (ref 98–192)

## 2021-01-12 MED ORDER — SODIUM CHLORIDE 0.9% FLUSH
10.0000 mL | INTRAVENOUS | Status: DC | PRN
  Administered 2021-01-12: 10 mL
  Filled 2021-01-12: qty 10

## 2021-01-12 MED ORDER — PROCHLORPERAZINE MALEATE 10 MG PO TABS
ORAL_TABLET | ORAL | Status: AC
  Filled 2021-01-12: qty 1

## 2021-01-12 MED ORDER — HEPARIN SOD (PORK) LOCK FLUSH 100 UNIT/ML IV SOLN
500.0000 [IU] | Freq: Once | INTRAVENOUS | Status: AC | PRN
  Administered 2021-01-12: 500 [IU]
  Filled 2021-01-12: qty 5

## 2021-01-12 MED ORDER — PROCHLORPERAZINE MALEATE 10 MG PO TABS
10.0000 mg | ORAL_TABLET | Freq: Once | ORAL | Status: AC
  Administered 2021-01-12: 10 mg via ORAL

## 2021-01-12 MED ORDER — SODIUM CHLORIDE 0.9 % IV SOLN
2200.0000 mg | Freq: Once | INTRAVENOUS | Status: AC
  Administered 2021-01-12: 2200 mg via INTRAVENOUS
  Filled 2021-01-12: qty 52.6

## 2021-01-12 MED ORDER — SODIUM CHLORIDE 0.9 % IV SOLN
Freq: Once | INTRAVENOUS | Status: AC
  Filled 2021-01-12: qty 250

## 2021-01-12 NOTE — Progress Notes (Signed)
Hematology and Oncology Follow Up Visit  Robert Kirby [id number] 1951-01-15 70 y.o. 01/12/2021   Principle Diagnosis:   Metastatic Thymic Cancer ([id number]) -- recurrent  Auto-immune limbic encephalopathy  Anti-cardiolipin Ab Syndrome  Current Therapy:    Gemzar 1000 mg/m2 q week (2 week on/ 1 week off)  -- sp cycle #2 - start on 11/29/2020  Xgeva 120 mg sq every 3 months  - next dose on 02/2021  Rituxan -- given at [facility] -- q 6 months  Coumadin to maintain INR 2.5-3     Interim History:  Robert Kirby is back for follow-up.  He seems to be managing pretty well with treatment.  He does have a cough according to his wife.  I do think he is productive of the cough.  This could certainly represent his underlying malignancy.  His appetite is okay.  He has had no problems with dysphagia or odynophagia.  He does get tired more easily.  Again I suspect this probably is from the chemotherapy itself.  He has had no bleeding.  There is been no change in bowel or bladder habits.  He has had no leg swelling.  There have been no rashes.  He does have the autoimmune limbic encephalopathy.  He is on Coumadin because of the anticardiolipin antibody syndrome.  He has INR today of 2.1.  He had a very nice [place] with his family.  Overall, I would say his performance status is by ECOG 1.     Medications:  Current Outpatient Medications:  .  busPIRone (BUSPAR) 30 MG tablet, Take 30 mg by mouth 2 (two) times daily., Disp: , Rfl:  .  carbamazepine (TEGRETOL XR) 200 MG 12 hr tablet, Take by mouth., Disp: , Rfl:  .  carvedilol (COREG) 3.125 MG tablet, Take 3.125 mg by mouth 2 (two) times daily., Disp: , Rfl:  .  clonazePAM (KLONOPIN) 0.5 MG tablet, Take 0.5 mg by mouth 2 (two) times daily., Disp: , Rfl:  .  COVID-19 mRNA Vac-TriS, Pfizer, (PFIZER-BIONT COVID-19 VAC-TRIS) SUSP injection, Inject into the muscle., Disp: 0.3 mL, Rfl: 0 .  denosumab (XGEVA) 120 MG/1.7ML SOLN injection, Inject into the  skin., Disp: , Rfl:  .  DULoxetine (CYMBALTA) 60 MG capsule, Take 120 mg by mouth daily. , Disp: , Rfl:  .  fluticasone (FLONASE) 50 MCG/ACT nasal spray, SHAKE LIQUID AND USE 2 SPRAYS IN EACH NOSTRIL DAILY, Disp: 48 g, Rfl: 2 .  furosemide (LASIX) 20 MG tablet, Take 1 tablet by mouth daily., Disp: , Rfl:  .  gemcitabine (GEMZAR) 1 g injection, Inject into the vein., Disp: , Rfl:  .  hydrocortisone (CORTEF) 10 MG tablet, TAKE 2 TABLETS IN THE MORNING AND 1 TABLET IN THE EVENING FOR ADDISONS DISEASE, Disp: 270 tablet, Rfl: 3 .  LORazepam (ATIVAN) 1 MG tablet, Take 0.5-1 tablets (0.5-1 mg total) by mouth every 8 (eight) hours as needed for anxiety., Disp: 30 tablet, Rfl: 0 .  losartan (COZAAR) 25 MG tablet, Take 25 mg by mouth daily., Disp: , Rfl:  .  mirtazapine (REMERON) 45 MG tablet, Take by mouth., Disp: , Rfl:  .  ondansetron (ZOFRAN) 8 MG tablet, Take 1 tablet (8 mg total) by mouth 2 (two) times daily as needed (Nausea or vomiting)., Disp: 30 tablet, Rfl: 1 .  prochlorperazine (COMPAZINE) 10 MG tablet, Take 1 tablet (10 mg total) by mouth every 6 (six) hours as needed (Nausea or vomiting)., Disp: 30 tablet, Rfl: 1 .  riTUXimab (RITUXAN) 100 MG/10ML  injection, Inject into the vein every 6 (six) months., Disp: , Rfl:  .  rosuvastatin (CRESTOR) 10 MG tablet, Take 1 tablet (10 mg total) by mouth at bedtime., Disp: 90 tablet, Rfl: 3 .  traMADol (ULTRAM) 50 MG tablet, Take 2 tablets (100 mg total) by mouth every 6 (six) hours as needed., Disp: 60 tablet, Rfl: 1 .  warfarin (COUMADIN) 5 MG tablet, Take 1/2 tablet daily except 1  tablet  On Tuesday and Saturday  or  As directed, Disp: , Rfl:   Allergies:  Allergies  Allergen Reactions  . Other Other (See Comments), Nausea And Vomiting and Nausea Only  . Tetracyclines & Related Nausea Only    Past Medical History, Surgical history, Social history, and Family History were reviewed and updated.  Review of Systems: Review of Systems   Constitutional: Negative.   HENT:  Negative.   Eyes: Negative.   Respiratory: Negative.   Cardiovascular: Negative.   Gastrointestinal: Negative.   Endocrine: Negative.   Genitourinary: Negative.    Musculoskeletal: Negative.   Skin: Negative.   Neurological: Negative.   Hematological: Negative.   Psychiatric/Behavioral: Negative.     Physical Exam:  weight is 207 lb (93.9 kg). His oral temperature is 98.1 F (36.7 C). His blood pressure is 109/88 and his pulse is 92. His respiration is 18 and oxygen saturation is 100%.   Wt Readings from Last 3 Encounters:  01/12/21 207 lb (93.9 kg)  12/22/20 209 lb (94.8 kg)  12/21/20 207 lb (93.9 kg)    Physical Exam Vitals reviewed.  HENT:     Head: Normocephalic and atraumatic.  Eyes:     Pupils: Pupils are equal, round, and reactive to light.  Cardiovascular:     Rate and Rhythm: Normal rate and regular rhythm.     Heart sounds: Normal heart sounds.  Pulmonary:     Effort: Pulmonary effort is normal.     Breath sounds: Normal breath sounds.  Abdominal:     General: Bowel sounds are normal.     Palpations: Abdomen is soft.  Musculoskeletal:        General: No tenderness or deformity. Normal range of motion.     Cervical back: Normal range of motion.  Lymphadenopathy:     Cervical: No cervical adenopathy.  Skin:    General: Skin is warm and dry.     Findings: No erythema or rash.  Neurological:     Mental Status: He is alert and oriented to person, place, and time.  Psychiatric:        Behavior: Behavior normal.        Thought Content: Thought content normal.        Judgment: Judgment normal.    Lab Results  Component Value Date   WBC 7.5 01/12/2021   HGB 12.3 (L) 01/12/2021   HCT 37.5 (L) 01/12/2021   MCV 94.2 01/12/2021   PLT 284 01/12/2021     Chemistry      Component Value Date/Time   NA 138 01/12/2021 1135   K 4.3 01/12/2021 1135   CL 104 01/12/2021 1135   CO2 30 01/12/2021 1135   BUN 22 01/12/2021  1135   CREATININE 0.89 01/12/2021 1135      Component Value Date/Time   CALCIUM 9.3 01/12/2021 1135   ALKPHOS 73 01/12/2021 1135   AST 20 01/12/2021 1135   ALT 24 01/12/2021 1135   BILITOT 0.3 01/12/2021 1135      Impression and Plan: Robert Kirby is a  very nice 70 year old male. He has a history of thymic carcinoma.  It has been 7 years since he had problems with thymic cancer.  He has been on treatment in the past with carboplatinum/Taxol.  He now has a recurrence.  We are now treating him with gemcitabine.  He has had 2 cycles to date.  We will go ahead with his third cycle.  He will receive a dose today and then next week.  We will then plan for a follow-up PET scan.  Hopefully, we will see a response.  I think we can do the PET scan in [place].  I am sure the radiologist will be able to get the scans from [facility] to look at for comparison.  Overall, his quality of life seems to be doing pretty well.  Hopefully, we will see a response.  We will plan to get him back to see me in about 1 month.     Volanda Napoleon, MD 4/22/202212:34 PM

## 2021-01-12 NOTE — Patient Instructions (Signed)
Gemcitabine injection What is this medicine? GEMCITABINE (jem SYE ta been) is a chemotherapy drug. This medicine is used to treat many types of cancer like breast cancer, lung cancer, pancreatic cancer, and ovarian cancer. This medicine may be used for other purposes; ask your health care provider or pharmacist if you have questions. COMMON BRAND NAME(S): Gemzar, Infugem What should I tell my health care provider before I take this medicine? They need to know if you have any of these conditions:  blood disorders  infection  kidney disease  liver disease  lung or breathing disease, like asthma  recent or ongoing radiation therapy  an unusual or allergic reaction to gemcitabine, other chemotherapy, other medicines, foods, dyes, or preservatives  pregnant or trying to get pregnant  breast-feeding How should I use this medicine? This drug is given as an infusion into a vein. It is administered in a hospital or clinic by a specially trained health care professional. Talk to your pediatrician regarding the use of this medicine in children. Special care may be needed. Overdosage: If you think you have taken too much of this medicine contact a poison control center or emergency room at once. NOTE: This medicine is only for you. Do not share this medicine with others. What if I miss a dose? It is important not to miss your dose. Call your doctor or health care professional if you are unable to keep an appointment. What may interact with this medicine?  medicines to increase blood counts like filgrastim, pegfilgrastim, sargramostim  some other chemotherapy drugs like cisplatin  vaccines Talk to your doctor or health care professional before taking any of these medicines:  acetaminophen  aspirin  ibuprofen  ketoprofen  naproxen This list may not describe all possible interactions. Give your health care provider a list of all the medicines, herbs, non-prescription drugs, or  dietary supplements you use. Also tell them if you smoke, drink alcohol, or use illegal drugs. Some items may interact with your medicine. What should I watch for while using this medicine? Visit your doctor for checks on your progress. This drug may make you feel generally unwell. This is not uncommon, as chemotherapy can affect healthy cells as well as cancer cells. Report any side effects. Continue your course of treatment even though you feel ill unless your doctor tells you to stop. In some cases, you may be given additional medicines to help with side effects. Follow all directions for their use. Call your doctor or health care professional for advice if you get a fever, chills or sore throat, or other symptoms of a cold or flu. Do not treat yourself. This drug decreases your body's ability to fight infections. Try to avoid being around people who are sick. This medicine may increase your risk to bruise or bleed. Call your doctor or health care professional if you notice any unusual bleeding. Be careful brushing and flossing your teeth or using a toothpick because you may get an infection or bleed more easily. If you have any dental work done, tell your dentist you are receiving this medicine. Avoid taking products that contain aspirin, acetaminophen, ibuprofen, naproxen, or ketoprofen unless instructed by your doctor. These medicines may hide a fever. Do not become pregnant while taking this medicine or for 6 months after stopping it. Women should inform their doctor if they wish to become pregnant or think they might be pregnant. Men should not father a child while taking this medicine and for 3 months after stopping it.  There is a potential for serious side effects to an unborn child. Talk to your health care professional or pharmacist for more information. Do not breast-feed an infant while taking this medicine or for at least 1 week after stopping it. Men should inform their doctors if they wish  to father a child. This medicine may lower sperm counts. Talk with your doctor or health care professional if you are concerned about your fertility. What side effects may I notice from receiving this medicine? Side effects that you should report to your doctor or health care professional as soon as possible:  allergic reactions like skin rash, itching or hives, swelling of the face, lips, or tongue  breathing problems  pain, redness, or irritation at site where injected  signs and symptoms of a dangerous change in heartbeat or heart rhythm like chest pain; dizziness; fast or irregular heartbeat; palpitations; feeling faint or lightheaded, falls; breathing problems  signs of decreased platelets or bleeding - bruising, pinpoint red spots on the skin, black, tarry stools, blood in the urine  signs of decreased red blood cells - unusually weak or tired, feeling faint or lightheaded, falls  signs of infection - fever or chills, cough, sore throat, pain or difficulty passing urine  signs and symptoms of kidney injury like trouble passing urine or change in the amount of urine  signs and symptoms of liver injury like dark yellow or brown urine; general ill feeling or flu-like symptoms; light-colored stools; loss of appetite; nausea; right upper belly pain; unusually weak or tired; yellowing of the eyes or skin  swelling of ankles, feet, hands Side effects that usually do not require medical attention (report to your doctor or health care professional if they continue or are bothersome):  constipation  diarrhea  hair loss  loss of appetite  nausea  rash  vomiting This list may not describe all possible side effects. Call your doctor for medical advice about side effects. You may report side effects to FDA at 1-800-FDA-1088. Where should I keep my medicine? This drug is given in a hospital or clinic and will not be stored at home. NOTE: This sheet is a summary. It may not cover all  possible information. If you have questions about this medicine, talk to your doctor, pharmacist, or health care provider.  2021 Elsevier/Gold Standard (2017-12-03 18:06:11)

## 2021-01-15 ENCOUNTER — Encounter: Payer: Self-pay | Admitting: Hematology & Oncology

## 2021-01-15 ENCOUNTER — Telehealth: Payer: Self-pay

## 2021-01-15 NOTE — Telephone Encounter (Signed)
S/w pt per sch message and he is aware of his appts   Robert Kirby

## 2021-01-15 NOTE — Addendum Note (Signed)
Addended by: Burney Gauze R on: 01/15/2021 04:15 PM   Modules accepted: Orders

## 2021-01-16 ENCOUNTER — Telehealth: Payer: Self-pay | Admitting: *Deleted

## 2021-01-16 ENCOUNTER — Other Ambulatory Visit: Payer: Self-pay

## 2021-01-16 ENCOUNTER — Telehealth: Payer: Self-pay

## 2021-01-16 ENCOUNTER — Other Ambulatory Visit: Payer: Self-pay | Admitting: Family

## 2021-01-16 ENCOUNTER — Ambulatory Visit (HOSPITAL_BASED_OUTPATIENT_CLINIC_OR_DEPARTMENT_OTHER)
Admission: RE | Admit: 2021-01-16 | Discharge: 2021-01-16 | Disposition: A | Discharge status: Home / Self Care | Payer: Medicare Other | Source: Ambulatory Visit | Attending: Family | Admitting: Family

## 2021-01-16 DIAGNOSIS — R0602 Shortness of breath: Secondary | ICD-10-CM | POA: Diagnosis not present

## 2021-01-16 DIAGNOSIS — D6861 Antiphospholipid syndrome: Secondary | ICD-10-CM | POA: Diagnosis not present

## 2021-01-16 DIAGNOSIS — R059 Cough, unspecified: Secondary | ICD-10-CM | POA: Diagnosis not present

## 2021-01-16 DIAGNOSIS — C37 Malignant neoplasm of thymus: Secondary | ICD-10-CM | POA: Insufficient documentation

## 2021-01-16 DIAGNOSIS — J90 Pleural effusion, not elsewhere classified: Secondary | ICD-10-CM | POA: Diagnosis not present

## 2021-01-16 NOTE — Telephone Encounter (Signed)
Message received from patient's wife stating that pt has had coughing episodes where he almost vomits and that his sats are dropping down to 86%-89% on O2 at 2 liters when ambulating and is requesting a CXR.  Dr. Marin Olp notified. Order received for patient to get CXR today.  Patient's wife notified per order of Dr. Marin Olp that patient can have CXR today. Pt.'s wife states that she will bring pt in today for CXR and is appreciative of call back.

## 2021-01-16 NOTE — Telephone Encounter (Signed)
Per sch message/confirmation, pt will not have a tx on 5/18 and pt is awre

## 2021-01-17 ENCOUNTER — Other Ambulatory Visit: Payer: Self-pay | Admitting: *Deleted

## 2021-01-17 ENCOUNTER — Telehealth: Payer: Self-pay | Admitting: *Deleted

## 2021-01-17 ENCOUNTER — Other Ambulatory Visit: Payer: Self-pay | Admitting: Family

## 2021-01-17 DIAGNOSIS — C37 Malignant neoplasm of thymus: Secondary | ICD-10-CM

## 2021-01-17 DIAGNOSIS — R0602 Shortness of breath: Secondary | ICD-10-CM

## 2021-01-17 DIAGNOSIS — Z950 Presence of cardiac pacemaker: Secondary | ICD-10-CM | POA: Diagnosis not present

## 2021-01-17 MED ORDER — HYDROCODONE-HOMATROPINE 5-1.5 MG/5ML PO SYRP: 5.0000 mL | ORAL_SOLUTION | Freq: Four times a day (QID) | ORAL | 0 refills | Status: DC | PRN

## 2021-01-17 MED ORDER — CEFDINIR 300 MG PO CAPS: 600.0000 mg | ORAL_CAPSULE | Freq: Every day | ORAL | 0 refills | Status: DC

## 2021-01-17 NOTE — Telephone Encounter (Signed)
-----   Message from Eliezer Bottom, NP sent at 01/17/2021  8:25 AM EDT ----- Has some small infiltrates. We will go ahead and have him take Omnicef 600 mg PO daily for 7 days to nip in the bud. Thank you!   ----- Message ----- From: Interface, Rad Results In Sent: 01/16/2021   3:33 PM EDT To: Eliezer Bottom, NP

## 2021-01-17 NOTE — Telephone Encounter (Signed)
Patient's wife notified per order of S. [place] NP that the CXR shows "some small infiltrates.  We will go ahead and have him take Omnicef 600 mg by mouth daily for seven days to nip in the bud."  Pt.'s wife is appreciative of call and would like to know if a cough medicine could be called in for patient.  She states that patient has tried OTC Delsym and Robitussin DM with no relief.  Candy notified that Dr. Marin Olp will send in a prescription for Hycodan for cough.  Freida Busman is appreciative of assistance and has no further questions or concerns at this time.

## 2021-01-18 ENCOUNTER — Other Ambulatory Visit: Payer: Self-pay | Admitting: *Deleted

## 2021-01-18 DIAGNOSIS — C37 Malignant neoplasm of thymus: Secondary | ICD-10-CM

## 2021-01-18 DIAGNOSIS — Z95828 Presence of other vascular implants and grafts: Secondary | ICD-10-CM

## 2021-01-18 DIAGNOSIS — D6861 Antiphospholipid syndrome: Secondary | ICD-10-CM

## 2021-01-18 DIAGNOSIS — R0602 Shortness of breath: Secondary | ICD-10-CM

## 2021-01-19 ENCOUNTER — Inpatient Hospital Stay: Payer: Medicare Other

## 2021-01-19 ENCOUNTER — Other Ambulatory Visit: Payer: Self-pay

## 2021-01-19 DIAGNOSIS — C37 Malignant neoplasm of thymus: Secondary | ICD-10-CM

## 2021-01-19 DIAGNOSIS — Z95828 Presence of other vascular implants and grafts: Secondary | ICD-10-CM

## 2021-01-19 DIAGNOSIS — D6861 Antiphospholipid syndrome: Secondary | ICD-10-CM

## 2021-01-19 DIAGNOSIS — Z79899 Other long term (current) drug therapy: Secondary | ICD-10-CM | POA: Diagnosis not present

## 2021-01-19 DIAGNOSIS — Z7901 Long term (current) use of anticoagulants: Secondary | ICD-10-CM | POA: Diagnosis not present

## 2021-01-19 DIAGNOSIS — C7951 Secondary malignant neoplasm of bone: Secondary | ICD-10-CM | POA: Diagnosis not present

## 2021-01-19 DIAGNOSIS — Z5111 Encounter for antineoplastic chemotherapy: Secondary | ICD-10-CM | POA: Diagnosis not present

## 2021-01-19 DIAGNOSIS — R0602 Shortness of breath: Secondary | ICD-10-CM

## 2021-01-19 LAB — CBC WITH DIFFERENTIAL (CANCER CENTER ONLY)
Abs Immature Granulocytes: 0.02 10*3/uL (ref 0.00–0.07)
Basophils Absolute: 0 10*3/uL (ref 0.0–0.1)
Basophils Relative: 1 %
Eosinophils Absolute: 0 10*3/uL (ref 0.0–0.5)
Eosinophils Relative: 0 %
HCT: 34.3 % — ABNORMAL LOW (ref 39.0–52.0)
Hemoglobin: 11.5 g/dL — ABNORMAL LOW (ref 13.0–17.0)
Immature Granulocytes: 1 %
Lymphocytes Relative: 18 %
Lymphs Abs: 0.6 10*3/uL — ABNORMAL LOW (ref 0.7–4.0)
MCH: 31.4 pg (ref 26.0–34.0)
MCHC: 33.5 g/dL (ref 30.0–36.0)
MCV: 93.7 fL (ref 80.0–100.0)
Monocytes Absolute: 0.8 10*3/uL (ref 0.1–1.0)
Monocytes Relative: 27 %
Neutro Abs: 1.6 10*3/uL — ABNORMAL LOW (ref 1.7–7.7)
Neutrophils Relative %: 53 %
Platelet Count: 310 10*3/uL (ref 150–400)
RBC: 3.66 MIL/uL — ABNORMAL LOW (ref 4.22–5.81)
RDW: 17.3 % — ABNORMAL HIGH (ref 11.5–15.5)
WBC Count: 3.1 10*3/uL — ABNORMAL LOW (ref 4.0–10.5)
nRBC: 0.7 % — ABNORMAL HIGH (ref 0.0–0.2)

## 2021-01-19 LAB — CMP (CANCER CENTER ONLY)
ALT: 25 U/L (ref 0–44)
AST: 21 U/L (ref 15–41)
Albumin: 3.9 g/dL (ref 3.5–5.0)
Alkaline Phosphatase: 72 U/L (ref 38–126)
Anion gap: 7 (ref 5–15)
BUN: 11 mg/dL (ref 8–23)
CO2: 26 mmol/L (ref 22–32)
Calcium: 8.9 mg/dL (ref 8.9–10.3)
Chloride: 104 mmol/L (ref 98–111)
Creatinine: 0.76 mg/dL (ref 0.61–1.24)
GFR, Estimated: >60 mL/min (ref >60)
Glucose, Bld: 103 mg/dL — ABNORMAL HIGH (ref 70–99)
Potassium: 4 mmol/L (ref 3.5–5.1)
Sodium: 137 mmol/L (ref 135–145)
Total Bilirubin: 0.3 mg/dL (ref 0.3–1.2)
Total Protein: 6.6 g/dL (ref 6.5–8.1)

## 2021-01-19 MED ORDER — PROCHLORPERAZINE MALEATE 10 MG PO TABS
ORAL_TABLET | ORAL | Status: AC
  Filled 2021-01-19: qty 1

## 2021-01-19 MED ORDER — PROCHLORPERAZINE MALEATE 10 MG PO TABS
10.0000 mg | ORAL_TABLET | Freq: Once | ORAL | Status: AC
  Administered 2021-01-19: 10 mg via ORAL

## 2021-01-19 MED ORDER — HEPARIN SOD (PORK) LOCK FLUSH 100 UNIT/ML IV SOLN
500.0000 [IU] | Freq: Once | INTRAVENOUS | Status: AC | PRN
  Administered 2021-01-19: 500 [IU]
  Filled 2021-01-19: qty 5

## 2021-01-19 MED ORDER — SODIUM CHLORIDE 0.9% FLUSH
10.0000 mL | INTRAVENOUS | Status: DC | PRN
  Administered 2021-01-19: 10 mL
  Filled 2021-01-19: qty 10

## 2021-01-19 MED ORDER — SODIUM CHLORIDE 0.9 % IV SOLN
Freq: Once | INTRAVENOUS | Status: AC
  Filled 2021-01-19: qty 250

## 2021-01-19 MED ORDER — SODIUM CHLORIDE 0.9 % IV SOLN
2200.0000 mg | Freq: Once | INTRAVENOUS | Status: AC
  Administered 2021-01-19: 2200 mg via INTRAVENOUS
  Filled 2021-01-19: qty 52.6

## 2021-01-19 NOTE — Patient Instructions (Signed)

## 2021-01-19 NOTE — Patient Instructions (Signed)
Gemcitabine injection What is this medicine? GEMCITABINE (jem SYE ta been) is a chemotherapy drug. This medicine is used to treat many types of cancer like breast cancer, lung cancer, pancreatic cancer, and ovarian cancer. This medicine may be used for other purposes; ask your health care provider or pharmacist if you have questions. COMMON BRAND NAME(S): Gemzar, Infugem What should I tell my health care provider before I take this medicine? They need to know if you have any of these conditions:  blood disorders  infection  kidney disease  liver disease  lung or breathing disease, like asthma  recent or ongoing radiation therapy  an unusual or allergic reaction to gemcitabine, other chemotherapy, other medicines, foods, dyes, or preservatives  pregnant or trying to get pregnant  breast-feeding How should I use this medicine? This drug is given as an infusion into a vein. It is administered in a hospital or clinic by a specially trained health care professional. Talk to your pediatrician regarding the use of this medicine in children. Special care may be needed. Overdosage: If you think you have taken too much of this medicine contact a poison control center or emergency room at once. NOTE: This medicine is only for you. Do not share this medicine with others. What if I miss a dose? It is important not to miss your dose. Call your doctor or health care professional if you are unable to keep an appointment. What may interact with this medicine?  medicines to increase blood counts like filgrastim, pegfilgrastim, sargramostim  some other chemotherapy drugs like cisplatin  vaccines Talk to your doctor or health care professional before taking any of these medicines:  acetaminophen  aspirin  ibuprofen  ketoprofen  naproxen This list may not describe all possible interactions. Give your health care provider a list of all the medicines, herbs, non-prescription drugs, or  dietary supplements you use. Also tell them if you smoke, drink alcohol, or use illegal drugs. Some items may interact with your medicine. What should I watch for while using this medicine? Visit your doctor for checks on your progress. This drug may make you feel generally unwell. This is not uncommon, as chemotherapy can affect healthy cells as well as cancer cells. Report any side effects. Continue your course of treatment even though you feel ill unless your doctor tells you to stop. In some cases, you may be given additional medicines to help with side effects. Follow all directions for their use. Call your doctor or health care professional for advice if you get a fever, chills or sore throat, or other symptoms of a cold or flu. Do not treat yourself. This drug decreases your body's ability to fight infections. Try to avoid being around people who are sick. This medicine may increase your risk to bruise or bleed. Call your doctor or health care professional if you notice any unusual bleeding. Be careful brushing and flossing your teeth or using a toothpick because you may get an infection or bleed more easily. If you have any dental work done, tell your dentist you are receiving this medicine. Avoid taking products that contain aspirin, acetaminophen, ibuprofen, naproxen, or ketoprofen unless instructed by your doctor. These medicines may hide a fever. Do not become pregnant while taking this medicine or for 6 months after stopping it. Women should inform their doctor if they wish to become pregnant or think they might be pregnant. Men should not father a child while taking this medicine and for 3 months after stopping it.  There is a potential for serious side effects to an unborn child. Talk to your health care professional or pharmacist for more information. Do not breast-feed an infant while taking this medicine or for at least 1 week after stopping it. Men should inform their doctors if they wish  to father a child. This medicine may lower sperm counts. Talk with your doctor or health care professional if you are concerned about your fertility. What side effects may I notice from receiving this medicine? Side effects that you should report to your doctor or health care professional as soon as possible:  allergic reactions like skin rash, itching or hives, swelling of the face, lips, or tongue  breathing problems  pain, redness, or irritation at site where injected  signs and symptoms of a dangerous change in heartbeat or heart rhythm like chest pain; dizziness; fast or irregular heartbeat; palpitations; feeling faint or lightheaded, falls; breathing problems  signs of decreased platelets or bleeding - bruising, pinpoint red spots on the skin, black, tarry stools, blood in the urine  signs of decreased red blood cells - unusually weak or tired, feeling faint or lightheaded, falls  signs of infection - fever or chills, cough, sore throat, pain or difficulty passing urine  signs and symptoms of kidney injury like trouble passing urine or change in the amount of urine  signs and symptoms of liver injury like dark yellow or brown urine; general ill feeling or flu-like symptoms; light-colored stools; loss of appetite; nausea; right upper belly pain; unusually weak or tired; yellowing of the eyes or skin  swelling of ankles, feet, hands Side effects that usually do not require medical attention (report to your doctor or health care professional if they continue or are bothersome):  constipation  diarrhea  hair loss  loss of appetite  nausea  rash  vomiting This list may not describe all possible side effects. Call your doctor for medical advice about side effects. You may report side effects to FDA at 1-800-FDA-1088. Where should I keep my medicine? This drug is given in a hospital or clinic and will not be stored at home. NOTE: This sheet is a summary. It may not cover all  possible information. If you have questions about this medicine, talk to your doctor, pharmacist, or health care provider.  2021 Elsevier/Gold Standard (2017-12-03 18:06:11)

## 2021-01-22 ENCOUNTER — Other Ambulatory Visit: Payer: Self-pay

## 2021-01-22 ENCOUNTER — Encounter: Payer: Self-pay | Admitting: Family Medicine

## 2021-01-22 ENCOUNTER — Ambulatory Visit (INDEPENDENT_AMBULATORY_CARE_PROVIDER_SITE_OTHER): Payer: Medicare Other | Admitting: Family Medicine

## 2021-01-22 VITALS — BP 122/64 | HR 112 | Temp 97.5°F | Ht 72.0 in | Wt 208.0 lb

## 2021-01-22 DIAGNOSIS — E271 Primary adrenocortical insufficiency: Secondary | ICD-10-CM

## 2021-01-22 DIAGNOSIS — M25421 Effusion, right elbow: Secondary | ICD-10-CM | POA: Diagnosis not present

## 2021-01-22 DIAGNOSIS — R569 Unspecified convulsions: Secondary | ICD-10-CM | POA: Diagnosis not present

## 2021-01-22 NOTE — Patient Instructions (Addendum)
Ice/cold pack over area for 10-15 min twice daily.  OK to take Tylenol 1000 mg (2 extra strength tabs) or 975 mg (3 regular strength tabs) every 6 hours as needed.  Elevate the arm as much as able.  Consider wrapping with an Ace bandage to help compress.   There is no deformity or really much pain with pushing. Your range of motion is great. The likelihood of a broken bone is very low in this setting.  The slight soreness of the elbow should resolve in the next 2-3 weeks. I expect the swelling to steadily improve over the same timeline.   Let me know if anything changes. It may start to bruise a little.  Let us know if you need anything.

## 2021-01-22 NOTE — Progress Notes (Signed)
Musculoskeletal Exam  Patient: Robert Kirby DOB: 05/09/1951  DOS: 01/22/2021  SUBJECTIVE:  Chief Complaint:   Chief Complaint  Patient presents with  . Arm Swelling    Robert Kirby is a 70 y.o.  male for evaluation and treatment of RUE swelling.   Onset:  2 weeks ago. Fell on  Location: prox R forearm There is no pain Progression of issue:  has slightly improved Associated symptoms: no bruising, redness, or decreased ROM Treatment: to date has been none.   Neurovascular symptoms: no  Past Medical History:  Diagnosis Date  . Addison's disease ([place])   . Antiphospholipid antibody syndrome ([place])   . Arrhythmia    Pacemaker  . Autoimmune encephalomyelitis   . Depression   . GERD (gastroesophageal reflux disease)   . Goals of care, counseling/discussion 11/24/2020  . History of blood transfusion    13 units from a GI Bleed.   Marland Kitchen History of GI bleed   . OSA on CPAP   . Sleep apnea    on CPAP  . 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 No acute distress. Thorax & Lungs: No accessory muscle use Musculoskeletal: R elbow.   Normal active range of motion: yes.   Normal passive range of motion: yes Tenderness to palpation: mild ttp over olecranon and lateral epicondyle Soft tissue swelling noted around elbow and down ulnar surface of forearm Ecchymosis: no Neurologic: Normal sensory function. Psychiatric: Normal mood.     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 I don't think imaging is warranted given good ROM and lack of ttp. Rec'd wrapping, elevation, ice, Tylenol.  F/u prn. The patient voiced understanding and  agreement to the plan.   [place], DO 01/22/21  11:48 AM

## 2021-01-24 ENCOUNTER — Other Ambulatory Visit: Payer: Self-pay | Admitting: Family Medicine

## 2021-01-24 DIAGNOSIS — F419 Anxiety disorder, unspecified: Secondary | ICD-10-CM | POA: Diagnosis not present

## 2021-01-24 MED ORDER — FLUTICASONE PROPIONATE 50 MCG/ACT NA SUSP: NASAL | 2 refills | Status: AC

## 2021-01-25 ENCOUNTER — Telehealth: Payer: Self-pay | Admitting: Family Medicine

## 2021-01-25 DIAGNOSIS — C37 Malignant neoplasm of thymus: Secondary | ICD-10-CM

## 2021-01-25 MED ORDER — ONDANSETRON HCL 8 MG PO TABS: 8.0000 mg | ORAL_TABLET | Freq: Two times a day (BID) | ORAL | 1 refills | Status: DC | PRN

## 2021-01-25 MED ORDER — ROSUVASTATIN CALCIUM 10 MG PO TABS: 10.0000 mg | ORAL_TABLET | Freq: Every day | ORAL | 3 refills | Status: DC

## 2021-01-25 MED ORDER — CARBAMAZEPINE ER 200 MG PO TB12: 200.0000 mg | ORAL_TABLET | Freq: Two times a day (BID) | ORAL | 2 refills | Status: AC

## 2021-01-25 MED ORDER — FUROSEMIDE 20 MG PO TABS: 20.0000 mg | ORAL_TABLET | Freq: Every day | ORAL | 1 refills | Status: DC

## 2021-01-25 NOTE — Telephone Encounter (Signed)
Medication: furosemide (LASIX) 20 MG tablet [[id number]] ENDED  rosuvastatin (CRESTOR) 10 MG tablet [[id number]]   carbamazepine (TEGRETOL XR) 200 MG 12 hr tablet [[id number]]   fluticasone (FLONASE) 50 MCG/ACT nasal spray [[id number]]   cefdinir (OMNICEF) 300 MG capsule [[id number]]   carbamazepine (TEGRETOL XR) 200 MG 12 hr tablet [[id number]]   ondansetron (ZOFRAN) 8 MG tablet [[id number]  Has the patient contacted their pharmacy? no (If no, request that the patient contact the pharmacy for the refill.) (If yes, when and what did the pharmacy advise?)    Preferred Pharmacy (with phone number or street name):  [place], [place] Phone:  [phone]  Fax:  [phone]        Agent: Please be advised that RX refills may take up to 3 business days. We ask that you follow-up with your pharmacy.

## 2021-01-25 NOTE — Telephone Encounter (Signed)
Rx sent 

## 2021-01-26 ENCOUNTER — Other Ambulatory Visit: Payer: Self-pay | Admitting: Family Medicine

## 2021-01-26 DIAGNOSIS — C37 Malignant neoplasm of thymus: Secondary | ICD-10-CM

## 2021-01-26 MED ORDER — ONDANSETRON HCL 8 MG PO TABS: 8.0000 mg | ORAL_TABLET | Freq: Two times a day (BID) | ORAL | 1 refills | Status: DC | PRN

## 2021-01-26 MED ORDER — ROSUVASTATIN CALCIUM 10 MG PO TABS: 10.0000 mg | ORAL_TABLET | Freq: Every day | ORAL | 3 refills | Status: AC

## 2021-01-30 DIAGNOSIS — K769 Liver disease, unspecified: Secondary | ICD-10-CM | POA: Diagnosis not present

## 2021-01-30 DIAGNOSIS — E2740 Unspecified adrenocortical insufficiency: Secondary | ICD-10-CM | POA: Diagnosis not present

## 2021-01-30 DIAGNOSIS — Z85830 Personal history of malignant neoplasm of bone: Secondary | ICD-10-CM | POA: Diagnosis not present

## 2021-01-30 DIAGNOSIS — R918 Other nonspecific abnormal finding of lung field: Secondary | ICD-10-CM | POA: Diagnosis not present

## 2021-01-30 DIAGNOSIS — C787 Secondary malignant neoplasm of liver and intrahepatic bile duct: Secondary | ICD-10-CM | POA: Diagnosis not present

## 2021-01-30 DIAGNOSIS — J90 Pleural effusion, not elsewhere classified: Secondary | ICD-10-CM | POA: Diagnosis not present

## 2021-01-30 DIAGNOSIS — J849 Interstitial pulmonary disease, unspecified: Secondary | ICD-10-CM | POA: Diagnosis not present

## 2021-01-30 DIAGNOSIS — I429 Cardiomyopathy, unspecified: Secondary | ICD-10-CM | POA: Diagnosis not present

## 2021-01-30 DIAGNOSIS — C37 Malignant neoplasm of thymus: Secondary | ICD-10-CM | POA: Diagnosis not present

## 2021-01-30 DIAGNOSIS — Z7901 Long term (current) use of anticoagulants: Secondary | ICD-10-CM | POA: Diagnosis not present

## 2021-01-30 DIAGNOSIS — D6861 Antiphospholipid syndrome: Secondary | ICD-10-CM | POA: Diagnosis not present

## 2021-02-02 ENCOUNTER — Ambulatory Visit: Payer: Medicare Other | Admitting: Internal Medicine

## 2021-02-07 ENCOUNTER — Ambulatory Visit (HOSPITAL_COMMUNITY)
Admission: RE | Admit: 2021-02-07 | Discharge: 2021-02-07 | Disposition: A | Discharge status: Home / Self Care | Payer: Medicare Other | Source: Ambulatory Visit | Attending: Hematology & Oncology | Admitting: Hematology & Oncology

## 2021-02-07 ENCOUNTER — Ambulatory Visit: Payer: Medicare Other

## 2021-02-07 ENCOUNTER — Inpatient Hospital Stay: Payer: Medicare Other | Attending: Hematology & Oncology

## 2021-02-07 ENCOUNTER — Inpatient Hospital Stay (HOSPITAL_BASED_OUTPATIENT_CLINIC_OR_DEPARTMENT_OTHER): Payer: Medicare Other | Admitting: Hematology & Oncology

## 2021-02-07 ENCOUNTER — Other Ambulatory Visit: Payer: Self-pay

## 2021-02-07 ENCOUNTER — Telehealth: Payer: Self-pay

## 2021-02-07 ENCOUNTER — Inpatient Hospital Stay: Payer: Medicare Other

## 2021-02-07 ENCOUNTER — Encounter: Payer: Self-pay | Admitting: Hematology & Oncology

## 2021-02-07 VITALS — BP 127/81 | HR 89 | Temp 97.8°F | Resp 18 | Ht 72.0 in | Wt 204.0 lb

## 2021-02-07 DIAGNOSIS — C782 Secondary malignant neoplasm of pleura: Secondary | ICD-10-CM | POA: Diagnosis not present

## 2021-02-07 DIAGNOSIS — C37 Malignant neoplasm of thymus: Secondary | ICD-10-CM | POA: Insufficient documentation

## 2021-02-07 DIAGNOSIS — Z79899 Other long term (current) drug therapy: Secondary | ICD-10-CM | POA: Diagnosis not present

## 2021-02-07 DIAGNOSIS — Z7901 Long term (current) use of anticoagulants: Secondary | ICD-10-CM | POA: Diagnosis not present

## 2021-02-07 DIAGNOSIS — C7989 Secondary malignant neoplasm of other specified sites: Secondary | ICD-10-CM | POA: Diagnosis not present

## 2021-02-07 DIAGNOSIS — C787 Secondary malignant neoplasm of liver and intrahepatic bile duct: Secondary | ICD-10-CM | POA: Diagnosis not present

## 2021-02-07 DIAGNOSIS — C7951 Secondary malignant neoplasm of bone: Secondary | ICD-10-CM | POA: Diagnosis not present

## 2021-02-07 LAB — CBC WITH DIFFERENTIAL (CANCER CENTER ONLY)
Abs Immature Granulocytes: 0.03 10*3/uL (ref 0.00–0.07)
Basophils Absolute: 0 10*3/uL (ref 0.0–0.1)
Basophils Relative: 0 %
Eosinophils Absolute: 0.1 10*3/uL (ref 0.0–0.5)
Eosinophils Relative: 1 %
HCT: 37.9 % — ABNORMAL LOW (ref 39.0–52.0)
Hemoglobin: 12.7 g/dL — ABNORMAL LOW (ref 13.0–17.0)
Immature Granulocytes: 0 %
Lymphocytes Relative: 9 %
Lymphs Abs: 0.7 10*3/uL (ref 0.7–4.0)
MCH: 31.6 pg (ref 26.0–34.0)
MCHC: 33.5 g/dL (ref 30.0–36.0)
MCV: 94.3 fL (ref 80.0–100.0)
Monocytes Absolute: 0.9 10*3/uL (ref 0.1–1.0)
Monocytes Relative: 12 %
Neutro Abs: 5.9 10*3/uL (ref 1.7–7.7)
Neutrophils Relative %: 78 %
Platelet Count: 406 10*3/uL — ABNORMAL HIGH (ref 150–400)
RBC: 4.02 MIL/uL — ABNORMAL LOW (ref 4.22–5.81)
RDW: 17.5 % — ABNORMAL HIGH (ref 11.5–15.5)
WBC Count: 7.6 10*3/uL (ref 4.0–10.5)
nRBC: 0 % (ref 0.0–0.2)

## 2021-02-07 LAB — CMP (CANCER CENTER ONLY)
ALT: 19 U/L (ref 0–44)
AST: 20 U/L (ref 15–41)
Albumin: 4 g/dL (ref 3.5–5.0)
Alkaline Phosphatase: 77 U/L (ref 38–126)
Anion gap: 7 (ref 5–15)
BUN: 17 mg/dL (ref 8–23)
CO2: 27 mmol/L (ref 22–32)
Calcium: 9.2 mg/dL (ref 8.9–10.3)
Chloride: 102 mmol/L (ref 98–111)
Creatinine: 0.83 mg/dL (ref 0.61–1.24)
GFR, Estimated: >60 mL/min (ref >60)
Glucose, Bld: 86 mg/dL (ref 70–99)
Potassium: 4.3 mmol/L (ref 3.5–5.1)
Sodium: 136 mmol/L (ref 135–145)
Total Bilirubin: 0.3 mg/dL (ref 0.3–1.2)
Total Protein: 6.6 g/dL (ref 6.5–8.1)

## 2021-02-07 LAB — LACTATE DEHYDROGENASE: LDH: 195 U/L — ABNORMAL HIGH (ref 98–192)

## 2021-02-07 LAB — GLUCOSE, CAPILLARY: Glucose-Capillary: 88 mg/dL (ref 70–99)

## 2021-02-07 MED ORDER — FLUDEOXYGLUCOSE F - 18 (FDG) INJECTION
10.2000 | Freq: Once | INTRAVENOUS | Status: AC | PRN
  Administered 2021-02-07: 10.2 via INTRAVENOUS

## 2021-02-07 NOTE — Telephone Encounter (Signed)
appts made per 02/07/21 los and pt will gain sch at [employer] and through [employer]

## 2021-02-07 NOTE — Progress Notes (Signed)
Hematology and Oncology Follow Up Visit  Robert Kirby [id number] 30-Apr-1951 70 y.o. 02/07/2021   Principle Diagnosis:   Metastatic Thymic Cancer ([id number]) -- recurrent  Auto-immune limbic encephalopathy  Anti-cardiolipin Ab Syndrome  Current Therapy:    Gemzar 1000 mg/m2 q week (2 week on/ 1 week off)  -- sp cycle #3 - start on 11/29/2020  Xgeva 120 mg sq every 3 months  - next dose on 02/2021  Rituxan -- given at [facility] -- q 6 months  Coumadin to maintain INR 2.5-3     Interim History:  Robert Kirby is back for follow-up.  He comes in with his wife.  He does have a feeling quite tired.  It appeared to happen most after the last cycle of the gemcitabine.  He just does not have a lot of energy.  I suspect that this feeling is probably from the gemcitabine.  He did see Dr. Sharlet Salina at [facility].  He did have a CT scan done.  This was done on 01/30/2021.  This showed moderate left pleural effusion that was slight increase in size.  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  He has stable pleural-based right upper lobe nodule measuring 2.1 x 1.2 cm.  A large left lower lobe pleural-based nodule measured 4.9 x 3.2 cm.  A right infrahilar mass measured 3.7 x 4.9 cm.  In the liver, there was a lesion in the left hepatic lobe measuring 2.8 x 2.5 cm.  There is seem to be some activity in the right eighth rib that was new.  Other areas in his spine and hips seem to show some interval progression.  Dr. Sharlet Salina felt that he needed in a month off from treatment.  I certainly cannot argue with this.  I know that immunotherapy has had some effectiveness with thymic carcinoma.  However, because of the limbic encephalopathy, we would not be able to really consider this.  He is on Coumadin.  He is doing well with Coumadin.  His wife said that the last INR was 3.5.  This is being managed by Coumadin clinic.  He has had no fever.  He is a little cough.  The cough is nonproductive.  There is no  hemoptysis.  He has had no diarrhea.  There might be a little bit of constipation.  Overall, his performance status is ECOG 1.    Medications:  Current Outpatient Medications:  .  busPIRone (BUSPAR) 30 MG tablet, Take 30 mg by mouth 2 (two) times daily., Disp: , Rfl:  .  carbamazepine (TEGRETOL XR) 200 MG 12 hr tablet, Take 1 tablet (200 mg total) by mouth 2 (two) times daily., Disp: 90 tablet, Rfl: 2 .  carvedilol (COREG) 3.125 MG tablet, Take 3.125 mg by mouth 2 (two) times daily., Disp: , Rfl:  .  clonazePAM (KLONOPIN) 0.5 MG tablet, Take 0.5 mg by mouth 2 (two) times daily., Disp: , Rfl:  .  denosumab (XGEVA) 120 MG/1.7ML SOLN injection, Inject into the skin., Disp: , Rfl:  .  DULoxetine (CYMBALTA) 60 MG capsule, Take 120 mg by mouth daily. , Disp: , Rfl:  .  fluticasone (FLONASE) 50 MCG/ACT nasal spray, SHAKE LIQUID AND USE 2 SPRAYS IN EACH NOSTRIL DAILY, Disp: 48 g, Rfl: 2 .  furosemide (LASIX) 20 MG tablet, Take 1 tablet (20 mg total) by mouth daily., Disp: 30 tablet, Rfl: 1 .  gemcitabine (GEMZAR) 1 g injection, Inject into the vein., Disp: , Rfl:  .  hydrocortisone (CORTEF) 10 MG tablet, TAKE 2 TABLETS IN THE MORNING AND 1 TABLET IN THE EVENING FOR ADDISONS DISEASE, Disp: 270 tablet, Rfl: 3 .  LORazepam (ATIVAN) 1 MG tablet, Take 0.5-1 tablets (0.5-1 mg total) by mouth every 8 (eight) hours as needed for anxiety., Disp: 30 tablet, Rfl: 0 .  losartan (COZAAR) 25 MG tablet, Take 25 mg by mouth daily., Disp: , Rfl:  .  ondansetron (ZOFRAN) 8 MG tablet, Take 1 tablet (8 mg total) by mouth 2 (two) times daily as needed (Nausea or vomiting)., Disp: 90 tablet, Rfl: 1 .  prochlorperazine (COMPAZINE) 10 MG tablet, Take 1 tablet (10 mg total) by mouth every 6 (six) hours as needed (Nausea or vomiting)., Disp: 30 tablet, Rfl: 1 .  riTUXimab (RITUXAN) 100 MG/10ML injection, Inject into the vein every 6 (six) months., Disp: , Rfl:  .  rosuvastatin (CRESTOR) 10 MG tablet, Take 1 tablet (10 mg  total) by mouth at bedtime., Disp: 90 tablet, Rfl: 3 .  traMADol (ULTRAM) 50 MG tablet, Take 2 tablets (100 mg total) by mouth every 6 (six) hours as needed., Disp: 60 tablet, Rfl: 1 .  warfarin (COUMADIN) 5 MG tablet, Take 1/2 tablet daily except 1  tablet  On Tuesday and Saturday  or  As directed, Disp: , Rfl:  .  mirtazapine (REMERON) 45 MG tablet, Take by mouth., Disp: , Rfl:   Allergies:  Allergies  Allergen Reactions  . Other Other (See Comments), Nausea And Vomiting and Nausea Only  . Tetracyclines & Related Nausea Only    Past Medical History, Surgical history, Social history, and Family History were reviewed and updated.  Review of Systems: Review of Systems  Constitutional: Negative.   HENT:  Negative.   Eyes: Negative.   Respiratory: Negative.   Cardiovascular: Negative.   Gastrointestinal: Negative.   Endocrine: Negative.   Genitourinary: Negative.    Musculoskeletal: Negative.   Skin: Negative.   Neurological: Negative.   Hematological: Negative.   Psychiatric/Behavioral: Negative.     Physical Exam:  height is 6' (1.829 m) and weight is 204 lb (92.5 kg). His oral temperature is 97.8 F (36.6 C). His blood pressure is 127/81 and his pulse is 89. His respiration is 18 and oxygen saturation is 98%.   Wt Readings from Last 3 Encounters:  02/07/21 204 lb (92.5 kg)  01/22/21 208 lb (94.3 kg)  01/12/21 207 lb (93.9 kg)    Physical Exam Vitals reviewed.  HENT:     Head: Normocephalic and atraumatic.  Eyes:     Pupils: Pupils are equal, round, and reactive to light.  Cardiovascular:     Rate and Rhythm: Normal rate and regular rhythm.     Heart sounds: Normal heart sounds.  Pulmonary:     Effort: Pulmonary effort is normal.     Breath sounds: Normal breath sounds.  Abdominal:     General: Bowel sounds are normal.     Palpations: Abdomen is soft.  Musculoskeletal:        General: No tenderness or deformity. Normal range of motion.     Cervical back:  Normal range of motion.  Lymphadenopathy:     Cervical: No cervical adenopathy.  Skin:    General: Skin is warm and dry.     Findings: No erythema or rash.  Neurological:     Mental Status: He is alert and oriented to person, place, and time.  Psychiatric:        Behavior: Behavior normal.  Thought Content: Thought content normal.        Judgment: Judgment normal.    Lab Results  Component Value Date   WBC 7.6 02/07/2021   HGB 12.7 (L) 02/07/2021   HCT 37.9 (L) 02/07/2021   MCV 94.3 02/07/2021   PLT 406 (H) 02/07/2021     Chemistry      Component Value Date/Time   NA 137 01/19/2021 1106   K 4.0 01/19/2021 1106   CL 104 01/19/2021 1106   CO2 26 01/19/2021 1106   BUN 11 01/19/2021 1106   CREATININE 0.76 01/19/2021 1106      Component Value Date/Time   CALCIUM 8.9 01/19/2021 1106   ALKPHOS 72 01/19/2021 1106   AST 21 01/19/2021 1106   ALT 25 01/19/2021 1106   BILITOT 0.3 01/19/2021 1106      Impression and Plan: Robert Kirby is a very nice 70 year old male. He has a history of thymic carcinoma.  It has been 7 years since he had problems with thymic cancer.  He has been on treatment in the past with carboplatinum/Taxol.  He now has a recurrence.  We are now treating him with gemcitabine.  He has had 3 cycles to date.  We will have to see what the PET scan shows.  He actually had a PET scan done this morning.  I do not have the results back as of yet.  I will have to speak with Dr. Sharlet Salina once I get the results back.  If we find that he does have progression, it is hard to say what might be the next step.  I know he is not received Adriamycin based therapy.  This might be an option.  I again we note that there just are not a lot of trials, certainly not any big trials, that look at chemotherapy with thymic carcinoma.  Regardless, Robert Kirby quality of life is clearly the primary goal.  We will have to see or get him back in 1 month.  We will get him back  sooner if he has problems.  I will call Dr. Sharlet Salina once I have the PET scan result back.   Volanda Napoleon, MD 5/18/202212:52 PM

## 2021-02-07 NOTE — Patient Instructions (Signed)

## 2021-02-09 ENCOUNTER — Other Ambulatory Visit: Payer: Self-pay | Admitting: Hematology & Oncology

## 2021-02-09 DIAGNOSIS — C37 Malignant neoplasm of thymus: Secondary | ICD-10-CM

## 2021-02-13 DIAGNOSIS — Z7901 Long term (current) use of anticoagulants: Secondary | ICD-10-CM | POA: Diagnosis not present

## 2021-02-13 DIAGNOSIS — G0481 Other encephalitis and encephalomyelitis: Secondary | ICD-10-CM | POA: Diagnosis not present

## 2021-02-15 ENCOUNTER — Telehealth: Payer: Self-pay

## 2021-02-15 ENCOUNTER — Other Ambulatory Visit: Payer: Self-pay | Admitting: Hematology & Oncology

## 2021-02-15 DIAGNOSIS — C37 Malignant neoplasm of thymus: Secondary | ICD-10-CM

## 2021-02-15 NOTE — Telephone Encounter (Signed)
Called and s/w pts wife and she is aware of the new/adjusted appts per inbasket and secure chat    Robert Kirby

## 2021-02-20 ENCOUNTER — Ambulatory Visit (HOSPITAL_COMMUNITY)
Admission: RE | Admit: 2021-02-20 | Discharge: 2021-02-20 | Disposition: A | Discharge status: Home / Self Care | Payer: Medicare Other | Source: Ambulatory Visit | Attending: Hematology & Oncology | Admitting: Hematology & Oncology

## 2021-02-20 ENCOUNTER — Other Ambulatory Visit: Payer: Self-pay

## 2021-02-20 DIAGNOSIS — Z85238 Personal history of other malignant neoplasm of thymus: Secondary | ICD-10-CM | POA: Diagnosis not present

## 2021-02-20 DIAGNOSIS — S22059A Unspecified fracture of T5-T6 vertebra, initial encounter for closed fracture: Secondary | ICD-10-CM | POA: Diagnosis not present

## 2021-02-20 DIAGNOSIS — C37 Malignant neoplasm of thymus: Secondary | ICD-10-CM

## 2021-02-20 DIAGNOSIS — D492 Neoplasm of unspecified behavior of bone, soft tissue, and skin: Secondary | ICD-10-CM | POA: Diagnosis not present

## 2021-02-20 DIAGNOSIS — M5124 Other intervertebral disc displacement, thoracic region: Secondary | ICD-10-CM | POA: Diagnosis not present

## 2021-02-20 MED ORDER — GADOBUTROL 1 MMOL/ML IV SOLN
9.0000 mL | Freq: Once | INTRAVENOUS | Status: AC | PRN
  Administered 2021-02-20: 9 mL via INTRAVENOUS

## 2021-02-20 NOTE — Progress Notes (Signed)
Patient here today at [facility] for MRI thoracic spine W WO contrast. Patient has [employer] device. Transmission sent to [employer] rep. Recommended no changes to device for scan, however to clear diagnostic post imaging.

## 2021-02-23 ENCOUNTER — Inpatient Hospital Stay: Payer: Medicare Other

## 2021-02-23 ENCOUNTER — Inpatient Hospital Stay (HOSPITAL_BASED_OUTPATIENT_CLINIC_OR_DEPARTMENT_OTHER): Payer: Medicare Other | Admitting: Hematology & Oncology

## 2021-02-23 ENCOUNTER — Other Ambulatory Visit: Payer: Self-pay

## 2021-02-23 ENCOUNTER — Encounter: Payer: Self-pay | Admitting: Hematology & Oncology

## 2021-02-23 ENCOUNTER — Inpatient Hospital Stay: Payer: Medicare Other | Attending: Hematology & Oncology

## 2021-02-23 VITALS — BP 132/90 | HR 100 | Temp 98.5°F | Resp 24 | Wt 206.8 lb

## 2021-02-23 DIAGNOSIS — C37 Malignant neoplasm of thymus: Secondary | ICD-10-CM

## 2021-02-23 DIAGNOSIS — Z95828 Presence of other vascular implants and grafts: Secondary | ICD-10-CM

## 2021-02-23 DIAGNOSIS — Z79899 Other long term (current) drug therapy: Secondary | ICD-10-CM | POA: Insufficient documentation

## 2021-02-23 DIAGNOSIS — Z5111 Encounter for antineoplastic chemotherapy: Secondary | ICD-10-CM | POA: Insufficient documentation

## 2021-02-23 DIAGNOSIS — C7951 Secondary malignant neoplasm of bone: Secondary | ICD-10-CM | POA: Diagnosis not present

## 2021-02-23 DIAGNOSIS — Z5189 Encounter for other specified aftercare: Secondary | ICD-10-CM | POA: Insufficient documentation

## 2021-02-23 LAB — CBC WITH DIFFERENTIAL (CANCER CENTER ONLY)
Abs Immature Granulocytes: 0.03 10*3/uL (ref 0.00–0.07)
Basophils Absolute: 0 10*3/uL (ref 0.0–0.1)
Basophils Relative: 0 %
Eosinophils Absolute: 0.1 10*3/uL (ref 0.0–0.5)
Eosinophils Relative: 1 %
HCT: 38.1 % — ABNORMAL LOW (ref 39.0–52.0)
Hemoglobin: 12.8 g/dL — ABNORMAL LOW (ref 13.0–17.0)
Immature Granulocytes: 0 %
Lymphocytes Relative: 8 %
Lymphs Abs: 0.8 10*3/uL (ref 0.7–4.0)
MCH: 31.5 pg (ref 26.0–34.0)
MCHC: 33.6 g/dL (ref 30.0–36.0)
MCV: 93.8 fL (ref 80.0–100.0)
Monocytes Absolute: 0.8 10*3/uL (ref 0.1–1.0)
Monocytes Relative: 9 %
Neutro Abs: 7.4 10*3/uL (ref 1.7–7.7)
Neutrophils Relative %: 82 %
Platelet Count: 215 10*3/uL (ref 150–400)
RBC: 4.06 MIL/uL — ABNORMAL LOW (ref 4.22–5.81)
RDW: 16.6 % — ABNORMAL HIGH (ref 11.5–15.5)
WBC Count: 9.1 10*3/uL (ref 4.0–10.5)
nRBC: 0 % (ref 0.0–0.2)

## 2021-02-23 LAB — CMP (CANCER CENTER ONLY)
ALT: 29 U/L (ref 0–44)
AST: 24 U/L (ref 15–41)
Albumin: 4.1 g/dL (ref 3.5–5.0)
Alkaline Phosphatase: 75 U/L (ref 38–126)
Anion gap: 7 (ref 5–15)
BUN: 18 mg/dL (ref 8–23)
CO2: 27 mmol/L (ref 22–32)
Calcium: 9.9 mg/dL (ref 8.9–10.3)
Chloride: 104 mmol/L (ref 98–111)
Creatinine: 0.85 mg/dL (ref 0.61–1.24)
GFR, Estimated: >60 mL/min (ref >60)
Glucose, Bld: 98 mg/dL (ref 70–99)
Potassium: 4.5 mmol/L (ref 3.5–5.1)
Sodium: 138 mmol/L (ref 135–145)
Total Bilirubin: 0.5 mg/dL (ref 0.3–1.2)
Total Protein: 6.5 g/dL (ref 6.5–8.1)

## 2021-02-23 LAB — LACTATE DEHYDROGENASE: LDH: 222 U/L — ABNORMAL HIGH (ref 98–192)

## 2021-02-23 MED ORDER — PALONOSETRON HCL INJECTION 0.25 MG/5ML
0.2500 mg | Freq: Once | INTRAVENOUS | Status: AC
  Administered 2021-02-23: 0.25 mg via INTRAVENOUS

## 2021-02-23 MED ORDER — DEXAMETHASONE 4 MG PO TABS: 8.0000 mg | ORAL_TABLET | Freq: Every day | ORAL | 1 refills | Status: DC

## 2021-02-23 MED ORDER — LORAZEPAM 0.5 MG PO TABS: 0.5000 mg | ORAL_TABLET | Freq: Four times a day (QID) | ORAL | 0 refills | Status: DC | PRN

## 2021-02-23 MED ORDER — PALONOSETRON HCL INJECTION 0.25 MG/5ML
INTRAVENOUS | Status: AC
  Filled 2021-02-23: qty 5

## 2021-02-23 MED ORDER — SODIUM CHLORIDE 0.9% FLUSH
10.0000 mL | INTRAVENOUS | Status: DC | PRN
  Administered 2021-02-23: 10 mL
  Filled 2021-02-23: qty 10

## 2021-02-23 MED ORDER — HEPARIN SOD (PORK) LOCK FLUSH 100 UNIT/ML IV SOLN
500.0000 [IU] | Freq: Once | INTRAVENOUS | Status: AC | PRN
  Administered 2021-02-23: 500 [IU]
  Filled 2021-02-23: qty 5

## 2021-02-23 MED ORDER — SODIUM CHLORIDE 0.9 % IV SOLN
150.0000 mg | Freq: Once | INTRAVENOUS | Status: AC
  Administered 2021-02-23: 150 mg via INTRAVENOUS
  Filled 2021-02-23: qty 150

## 2021-02-23 MED ORDER — LIDOCAINE-PRILOCAINE 2.5-2.5 % EX CREA: TOPICAL_CREAM | CUTANEOUS | 3 refills | Status: DC

## 2021-02-23 MED ORDER — DENOSUMAB 120 MG/1.7ML ~~LOC~~ SOLN
SUBCUTANEOUS | Status: AC
  Filled 2021-02-23: qty 1.7

## 2021-02-23 MED ORDER — DOXORUBICIN HCL CHEMO IV INJECTION 2 MG/ML
48.0000 mg/m2 | Freq: Once | INTRAVENOUS | Status: AC
  Administered 2021-02-23: 104 mg via INTRAVENOUS
  Filled 2021-02-23: qty 52

## 2021-02-23 MED ORDER — SODIUM CHLORIDE 0.9 % IV SOLN
600.0000 mg/m2 | Freq: Once | INTRAVENOUS | Status: AC
  Administered 2021-02-23: 1300 mg via INTRAVENOUS
  Filled 2021-02-23: qty 65

## 2021-02-23 MED ORDER — DEXAMETHASONE 4 MG PO TABS: 12.0000 mg | ORAL_TABLET | Freq: Every day | ORAL | 0 refills | Status: AC

## 2021-02-23 MED ORDER — DENOSUMAB 120 MG/1.7ML ~~LOC~~ SOLN
120.0000 mg | Freq: Once | SUBCUTANEOUS | Status: AC
Start: 2021-02-23 — End: 2021-02-23
  Administered 2021-02-23: 120 mg via SUBCUTANEOUS

## 2021-02-23 MED ORDER — SODIUM CHLORIDE 0.9% FLUSH
10.0000 mL | Freq: Once | INTRAVENOUS | Status: AC
Start: 2021-02-23 — End: 2021-02-23
  Administered 2021-02-23: 10 mL via INTRAVENOUS
  Filled 2021-02-23: qty 10

## 2021-02-23 MED ORDER — SODIUM CHLORIDE 0.9 % IV SOLN
10.0000 mg | Freq: Once | INTRAVENOUS | Status: AC
  Administered 2021-02-23: 10 mg via INTRAVENOUS
  Filled 2021-02-23: qty 10

## 2021-02-23 MED ORDER — ONDANSETRON HCL 8 MG PO TABS: 8.0000 mg | ORAL_TABLET | Freq: Two times a day (BID) | ORAL | 1 refills | Status: DC | PRN

## 2021-02-23 MED ORDER — SODIUM CHLORIDE 0.9 % IV SOLN
Freq: Once | INTRAVENOUS | Status: AC
Start: 2021-02-23 — End: 2021-02-23
  Filled 2021-02-23: qty 250

## 2021-02-23 MED ORDER — PROCHLORPERAZINE MALEATE 10 MG PO TABS: 10.0000 mg | ORAL_TABLET | Freq: Four times a day (QID) | ORAL | 1 refills | Status: DC | PRN

## 2021-02-23 NOTE — Patient Instructions (Addendum)
Cyclophosphamide Injection What is this medicine? CYCLOPHOSPHAMIDE (sye kloe FOSS fa mide) is a chemotherapy drug. It slows the growth of cancer cells. This medicine is used to treat many types of cancer like lymphoma, myeloma, leukemia, breast cancer, and ovarian cancer, to name a few. This medicine may be used for other purposes; ask your health care provider or pharmacist if you have questions. COMMON BRAND NAME(S): Cytoxan, Neosar What should I tell my health care provider before I take this medicine? They need to know if you have any of these conditions:  heart disease  history of irregular heartbeat  infection  kidney disease  liver disease  low blood counts, like white cells, platelets, or red blood cells  on hemodialysis  recent or ongoing radiation therapy  scarring or thickening of the lungs  trouble passing urine  an unusual or allergic reaction to cyclophosphamide, other medicines, foods, dyes, or preservatives  pregnant or trying to get pregnant  breast-feeding How should I use this medicine? This drug is usually given as an injection into a vein or muscle or by infusion into a vein. It is administered in a hospital or clinic by a specially trained health care professional. Talk to your pediatrician regarding the use of this medicine in children. Special care may be needed. Overdosage: If you think you have taken too much of this medicine contact a poison control center or emergency room at once. NOTE: This medicine is only for you. Do not share this medicine with others. What if I miss a dose? It is important not to miss your dose. Call your doctor or health care professional if you are unable to keep an appointment. What may interact with this medicine?  amphotericin B  azathioprine  certain antivirals for HIV or hepatitis  certain medicines for blood pressure, heart disease, irregular heart beat  certain medicines that treat or prevent blood clots  like warfarin  certain other medicines for cancer  cyclosporine  etanercept  indomethacin  medicines that relax muscles for surgery  medicines to increase blood counts  metronidazole This list may not describe all possible interactions. Give your health care provider a list of all the medicines, herbs, non-prescription drugs, or dietary supplements you use. Also tell them if you smoke, drink alcohol, or use illegal drugs. Some items may interact with your medicine. What should I watch for while using this medicine? Your condition will be monitored carefully while you are receiving this medicine. You may need blood work done while you are taking this medicine. Drink water or other fluids as directed. Urinate often, even at night. Some products may contain alcohol. Ask your health care professional if this medicine contains alcohol. Be sure to tell all health care professionals you are taking this medicine. Certain medicines, like metronidazole and disulfiram, can cause an unpleasant reaction when taken with alcohol. The reaction includes flushing, headache, nausea, vomiting, sweating, and increased thirst. The reaction can last from 30 minutes to several hours. Do not become pregnant while taking this medicine or for 1 year after stopping it. Women should inform their health care professional if they wish to become pregnant or think they might be pregnant. Men should not father a child while taking this medicine and for 4 months after stopping it. There is potential for serious side effects to an unborn child. Talk to your health care professional for more information. Do not breast-feed an infant while taking this medicine or for 1 week after stopping it. This medicine has  caused ovarian failure in some women. This medicine may make it more difficult to get pregnant. Talk to your health care professional if you are concerned about your fertility. This medicine has caused decreased sperm  counts in some men. This may make it more difficult to father a child. Talk to your health care professional if you are concerned about your fertility. Call your health care professional for advice if you get a fever, chills, or sore throat, or other symptoms of a cold or flu. Do not treat yourself. This medicine decreases your body's ability to fight infections. Try to avoid being around people who are sick. Avoid taking medicines that contain aspirin, acetaminophen, ibuprofen, naproxen, or ketoprofen unless instructed by your health care professional. These medicines may hide a fever. Talk to your health care professional about your risk of cancer. You may be more at risk for certain types of cancer if you take this medicine. If you are going to need surgery or other procedure, tell your health care professional that you are using this medicine. Be careful brushing or flossing your teeth or using a toothpick because you may get an infection or bleed more easily. If you have any dental work done, tell your dentist you are receiving this medicine. What side effects may I notice from receiving this medicine? Side effects that you should report to your doctor or health care professional as soon as possible:  allergic reactions like skin rash, itching or hives, swelling of the face, lips, or tongue  breathing problems  nausea, vomiting  signs and symptoms of bleeding such as bloody or black, tarry stools; red or dark brown urine; spitting up blood or brown material that looks like coffee grounds; red spots on the skin; unusual bruising or bleeding from the eyes, gums, or nose  signs and symptoms of heart failure like fast, irregular heartbeat, sudden weight gain; swelling of the ankles, feet, hands  signs and symptoms of infection like fever; chills; cough; sore throat; pain or trouble passing urine  signs and symptoms of kidney injury like trouble passing urine or change in the amount of  urine  signs and symptoms of liver injury like dark yellow or brown urine; general ill feeling or flu-like symptoms; light-colored stools; loss of appetite; nausea; right upper belly pain; unusually weak or tired; yellowing of the eyes or skin Side effects that usually do not require medical attention (report to your doctor or health care professional if they continue or are bothersome):  confusion  decreased hearing  diarrhea  facial flushing  hair loss  headache  loss of appetite  missed menstrual periods  signs and symptoms of low red blood cells or anemia such as unusually weak or tired; feeling faint or lightheaded; falls  skin discoloration This list may not describe all possible side effects. Call your doctor for medical advice about side effects. You may report side effects to FDA at 1-800-FDA-1088. Where should I keep my medicine? This drug is given in a hospital or clinic and will not be stored at home. NOTE: This sheet is a summary. It may not cover all possible information. If you have questions about this medicine, talk to your doctor, pharmacist, or health care provider.  2021 Elsevier/Gold Standard (2019-06-14 09:53:29) Doxorubicin injection What is this medicine? DOXORUBICIN (dox oh ROO bi sin) is a chemotherapy drug. It is used to treat many kinds of cancer like leukemia, lymphoma, neuroblastoma, sarcoma, and Wilms' tumor. It is also used to treat bladder  cancer, breast cancer, lung cancer, ovarian cancer, stomach cancer, and thyroid cancer. This medicine may be used for other purposes; ask your health care provider or pharmacist if you have questions. COMMON BRAND NAME(S): Adriamycin, Adriamycin PFS, Adriamycin RDF, Rubex What should I tell my health care provider before I take this medicine? They need to know if you have any of these conditions:  heart disease  history of low blood counts caused by a medicine  liver disease  recent or ongoing radiation  therapy  an unusual or allergic reaction to doxorubicin, other chemotherapy agents, other medicines, foods, dyes, or preservatives  pregnant or trying to get pregnant  breast-feeding How should I use this medicine? This drug is given as an infusion into a vein. It is administered in a hospital or clinic by a specially trained health care professional. If you have pain, swelling, burning or any unusual feeling around the site of your injection, tell your health care professional right away. Talk to your pediatrician regarding the use of this medicine in children. Special care may be needed. Overdosage: If you think you have taken too much of this medicine contact a poison control center or emergency room at once. NOTE: This medicine is only for you. Do not share this medicine with others. What if I miss a dose? It is important not to miss your dose. Call your doctor or health care professional if you are unable to keep an appointment. What may interact with this medicine? This medicine may interact with the following medications:  6-mercaptopurine  paclitaxel  phenytoin  St. John's Wort  trastuzumab  verapamil This list may not describe all possible interactions. Give your health care provider a list of all the medicines, herbs, non-prescription drugs, or dietary supplements you use. Also tell them if you smoke, drink alcohol, or use illegal drugs. Some items may interact with your medicine. What should I watch for while using this medicine? This drug may make you feel generally unwell. This is not uncommon, as chemotherapy can affect healthy cells as well as cancer cells. Report any side effects. Continue your course of treatment even though you feel ill unless your doctor tells you to stop. There is a maximum amount of this medicine you should receive throughout your life. The amount depends on the medical condition being treated and your overall health. Your doctor will watch how  much of this medicine you receive in your lifetime. Tell your doctor if you have taken this medicine before. You may need blood work done while you are taking this medicine. Your urine may turn red for a few days after your dose. This is not blood. If your urine is dark or brown, call your doctor. In some cases, you may be given additional medicines to help with side effects. Follow all directions for their use. Call your doctor or health care professional for advice if you get a fever, chills or sore throat, or other symptoms of a cold or flu. Do not treat yourself. This drug decreases your body's ability to fight infections. Try to avoid being around people who are sick. This medicine may increase your risk to bruise or bleed. Call your doctor or health care professional if you notice any unusual bleeding. Talk to your doctor about your risk of cancer. You may be more at risk for certain types of cancers if you take this medicine. Do not become pregnant while taking this medicine or for 6 months after stopping it. Women should inform  their doctor if they wish to become pregnant or think they might be pregnant. Men should not father a child while taking this medicine and for 6 months after stopping it. There is a potential for serious side effects to an unborn child. Talk to your health care professional or pharmacist for more information. Do not breast-feed an infant while taking this medicine. This medicine has caused ovarian failure in some women and reduced sperm counts in some men This medicine may interfere with the ability to have a child. Talk with your doctor or health care professional if you are concerned about your fertility. This medicine may cause a decrease in Co-Enzyme Q-10. You should make sure that you get enough Co-Enzyme Q-10 while you are taking this medicine. Discuss the foods you eat and the vitamins you take with your health care professional. What side effects may I notice from  receiving this medicine? Side effects that you should report to your doctor or health care professional as soon as possible:  allergic reactions like skin rash, itching or hives, swelling of the face, lips, or tongue  breathing problems  chest pain  fast or irregular heartbeat  low blood counts - this medicine may decrease the number of white blood cells, red blood cells and platelets. You may be at increased risk for infections and bleeding.  pain, redness, or irritation at site where injected  signs of infection - fever or chills, cough, sore throat, pain or difficulty passing urine  signs of decreased platelets or bleeding - bruising, pinpoint red spots on the skin, black, tarry stools, blood in the urine  swelling of the ankles, feet, hands  tiredness  weakness Side effects that usually do not require medical attention (report to your doctor or health care professional if they continue or are bothersome):  diarrhea  hair loss  mouth sores  nail discoloration or damage  nausea  red colored urine  vomiting This list may not describe all possible side effects. Call your doctor for medical advice about side effects. You may report side effects to FDA at 1-800-FDA-1088. Where should I keep my medicine? This drug is given in a hospital or clinic and will not be stored at home. NOTE: This sheet is a summary. It may not cover all possible information. If you have questions about this medicine, talk to your doctor, pharmacist, or health care provider.  2021 Elsevier/Gold Standard (2017-04-23 11:01:26) [facility]  Discharge Instructions: Thank you for choosing [facility] to provide your oncology and hematology care.   If you have a lab appointment with the [facility], please go directly to the [facility] and check in at the registration area.  Wear comfortable clothing and clothing appropriate for easy access to any  Portacath or PICC line.   We strive to give you quality time with your provider. You may need to reschedule your appointment if you arrive late (15 or more minutes).  Arriving late affects you and other patients whose appointments are after yours.  Also, if you miss three or more appointments without notifying the office, you may be dismissed from the clinic at the provider's discretion.      For prescription refill requests, have your pharmacy contact our office and allow 72 hours for refills to be completed.    Today you received the following chemotherapy and/or immunotherapy agents adriamycin, cytoxan    To help prevent nausea and vomiting after your treatment, we encourage  you to take your nausea medication as directed.  BELOW ARE SYMPTOMS THAT SHOULD BE REPORTED IMMEDIATELY: *FEVER GREATER THAN 100.4 F (38 C) OR HIGHER *CHILLS OR SWEATING *NAUSEA AND VOMITING THAT IS NOT CONTROLLED WITH YOUR NAUSEA MEDICATION *UNUSUAL SHORTNESS OF BREATH *UNUSUAL BRUISING OR BLEEDING *URINARY PROBLEMS (pain or burning when urinating, or frequent urination) *BOWEL PROBLEMS (unusual diarrhea, constipation, pain near the anus) TENDERNESS IN MOUTH AND THROAT WITH OR WITHOUT PRESENCE OF ULCERS (sore throat, sores in mouth, or a toothache) UNUSUAL RASH, SWELLING OR PAIN  UNUSUAL VAGINAL DISCHARGE OR ITCHING   Items with * indicate a potential emergency and should be followed up as soon as possible or go to the Emergency Department if any problems should occur.  Please show the CHEMOTHERAPY ALERT CARD or IMMUNOTHERAPY ALERT CARD at check-in to the Emergency Department and triage nurse. Should you have questions after your visit or need to cancel or reschedule your appointment, please contact [facility]  [phone] and follow the prompts.  Office hours are 8:00 a.m. to 4:30 p.m. Monday - Friday. Please note that voicemails left after 4:00 p.m. may not be returned until the  following business day.  We are closed weekends and major holidays. You have access to a nurse at all times for urgent questions. Please call the main number to the clinic [phone] and follow the prompts.  For any non-urgent questions, you may also contact your provider using MyChart. We now offer e-Visits for anyone 89 and older to request care online for non-urgent symptoms. For details visit [email or web address].   Also download the MyChart app! Go to the app store, search "MyChart", open the app, select [facility], and log in with your MyChart username and password.  Due to Covid, a mask is required upon entering the hospital/clinic. If you do not have a mask, one will be given to you upon arrival. For doctor visits, patients may have 1 support person aged 86 or older with them. For treatment visits, patients cannot have anyone with them due to current Covid guidelines and our immunocompromised population.

## 2021-02-23 NOTE — Progress Notes (Signed)
OK to treat today with no recent ECHO per order of Dr. Marin Olp

## 2021-02-23 NOTE — Progress Notes (Signed)
Hematology and Oncology Follow Up Visit  Robert Kirby [id number] [birth year] 70 y.o. 02/23/2021   Principle Diagnosis:   Metastatic Thymic Cancer ([id number]) -- recurrent  Auto-immune limbic encephalopathy  Anti-cardiolipin Ab Syndrome  Current Therapy:    Gemzar 1000 mg/m2 q week (2 week on/ 1 week off)  -- sp cycle #3 - start on 11/29/2020  Xgeva 120 mg sq every 3 months  - next dose on 02/2021  Rituxan -- given at [facility] -- q 6 months  Coumadin to maintain INR 2.5-3  Adria/Cytoxan -- start cycle #1 on 02/23/2021     Interim History:  Robert Kirby is back for follow-up.  Overall, he seems to be managing.  He does have progressive disease.  We have treated him with gemcitabine which really do not help all that much.  We actually did do an MRI of his thoracic spine.  There is some activity there noted on the PET scan.  The MRI did show that he had disease at the T11-12 region.  He had epidural disease.  There is disease that was entering into the spinal canal but not causing cord compression.  He has had no neurological symptoms.  He is already had radiation therapy to this area has not a candidate for additional radiation.   At this point, we really do not have a lot of options left for his systemic therapy.  He still has a decent performance status.  As such, I thought that would be reasonable to try Adriamycin/Cytoxan.  I realize he has not had the echocardiogram yet.  Hopefully he will have this next week.  He has had no cardiac symptoms so I do not think 1 dose would be a problem for him.  He is wife to have a lot of questions about his current situation.  We talked a lot about this.  I told him that I thought it was worthwhile trying 1 last line of chemotherapy.  If we do not see that he responds after 3 cycles, then I would favor switching priorities and then focusing on his comfort and quality of life.  He is wearing oxygen.  He does have some chest discomfort.  He does have  tumor recurrence in the chest area.  His appetite is okay.  I will think he is really lost weight as of yet.  He has not had any bleeding.  He has been no issues with bowels or bladder.  Currently, I would say his performance status is probably ECOG 1.     Medications:  Current Outpatient Medications:  .  busPIRone (BUSPAR) 30 MG tablet, Take 30 mg by mouth 2 (two) times daily., Disp: , Rfl:  .  carbamazepine (TEGRETOL XR) 200 MG 12 hr tablet, Take 1 tablet (200 mg total) by mouth 2 (two) times daily., Disp: 90 tablet, Rfl: 2 .  carvedilol (COREG) 3.125 MG tablet, Take 3.125 mg by mouth 2 (two) times daily., Disp: , Rfl:  .  clonazePAM (KLONOPIN) 0.5 MG tablet, Take 0.5 mg by mouth 2 (two) times daily., Disp: , Rfl:  .  denosumab (XGEVA) 120 MG/1.7ML SOLN injection, Inject into the skin., Disp: , Rfl:  .  dexamethasone (DECADRON) 4 MG tablet, Take 3 tablets (12 mg total) by mouth daily. Take the Decadron daily for 4 days in a row after each chemotherapy session, Disp: 100 tablet, Rfl: 0 .  DULoxetine (CYMBALTA) 60 MG capsule, Take 120 mg by mouth daily. , Disp: , Rfl:  .  fluticasone (  FLONASE) 50 MCG/ACT nasal spray, SHAKE LIQUID AND USE 2 SPRAYS IN EACH NOSTRIL DAILY, Disp: 48 g, Rfl: 2 .  furosemide (LASIX) 20 MG tablet, Take 1 tablet (20 mg total) by mouth daily., Disp: 30 tablet, Rfl: 1 .  gemcitabine (GEMZAR) 1 g injection, Inject into the vein., Disp: , Rfl:  .  hydrocortisone (CORTEF) 10 MG tablet, TAKE 2 TABLETS IN THE MORNING AND 1 TABLET IN THE EVENING FOR ADDISONS DISEASE, Disp: 270 tablet, Rfl: 3 .  LORazepam (ATIVAN) 1 MG tablet, Take 0.5-1 tablets (0.5-1 mg total) by mouth every 8 (eight) hours as needed for anxiety., Disp: 30 tablet, Rfl: 0 .  losartan (COZAAR) 25 MG tablet, Take 25 mg by mouth daily., Disp: , Rfl:  .  riTUXimab (RITUXAN) 100 MG/10ML injection, Inject into the vein every 6 (six) months., Disp: , Rfl:  .  rosuvastatin (CRESTOR) 10 MG tablet, Take 1 tablet (10  mg total) by mouth at bedtime., Disp: 90 tablet, Rfl: 3 .  traMADol (ULTRAM) 50 MG tablet, Take 2 tablets (100 mg total) by mouth every 6 (six) hours as needed., Disp: 60 tablet, Rfl: 1 .  warfarin (COUMADIN) 5 MG tablet, Take 2.5 mg by mouth daily., Disp: , Rfl:  .  dexamethasone (DECADRON) 4 MG tablet, Take 2 tablets (8 mg total) by mouth daily. Take daily for 3 days after chemo. Take with food., Disp: 30 tablet, Rfl: 1 .  lidocaine-prilocaine (EMLA) cream, Apply to affected area once, Disp: 30 g, Rfl: 3 .  LORazepam (ATIVAN) 0.5 MG tablet, Take 1 tablet (0.5 mg total) by mouth every 6 (six) hours as needed (Nausea or vomiting)., Disp: 30 tablet, Rfl: 0 .  mirtazapine (REMERON) 45 MG tablet, Take by mouth at bedtime., Disp: , Rfl:  .  ondansetron (ZOFRAN) 8 MG tablet, Take 1 tablet (8 mg total) by mouth 2 (two) times daily as needed. Start on the third day after chemotherapy., Disp: 30 tablet, Rfl: 1 .  prochlorperazine (COMPAZINE) 10 MG tablet, Take 1 tablet (10 mg total) by mouth every 6 (six) hours as needed (Nausea or vomiting)., Disp: 30 tablet, Rfl: 1 No current facility-administered medications for this visit.  Facility-Administered Medications Ordered in Other Visits:  .  sodium chloride flush (NS) 0.9 % injection 10 mL, 10 mL, Intracatheter, PRN, Volanda Napoleon, MD, 10 mL at 02/23/21 1659  Allergies:  Allergies  Allergen Reactions  . Other Other (See Comments), Nausea And Vomiting and Nausea Only  . Tetracyclines & Related Nausea Only    Past Medical History, Surgical history, Social history, and Family History were reviewed and updated.  Review of Systems: Review of Systems  Constitutional: Negative.   HENT:  Negative.   Eyes: Negative.   Respiratory: Negative.   Cardiovascular: Negative.   Gastrointestinal: Negative.   Endocrine: Negative.   Genitourinary: Negative.    Musculoskeletal: Negative.   Skin: Negative.   Neurological: Negative.   Hematological: Negative.    Psychiatric/Behavioral: Negative.     Physical Exam:  weight is 93.8 kg. His oral temperature is 98.5 F (36.9 C). His blood pressure is 132/90 and his pulse is 100. His respiration is 24 (abnormal) and oxygen saturation is 98%.   Wt Readings from Last 3 Encounters:  02/23/21 93.8 kg  02/07/21 92.5 kg  01/22/21 94.3 kg    Physical Exam Vitals reviewed.  HENT:     Head: Normocephalic and atraumatic.  Eyes:     Pupils: Pupils are equal, round, and reactive to light.  Cardiovascular:     Rate and Rhythm: Normal rate and regular rhythm.     Heart sounds: Normal heart sounds.  Pulmonary:     Effort: Pulmonary effort is normal.     Breath sounds: Normal breath sounds.  Abdominal:     General: Bowel sounds are normal.     Palpations: Abdomen is soft.  Musculoskeletal:        General: No tenderness or deformity. Normal range of motion.     Cervical back: Normal range of motion.  Lymphadenopathy:     Cervical: No cervical adenopathy.  Skin:    General: Skin is warm and dry.     Findings: No erythema or rash.  Neurological:     Mental Status: He is alert and oriented to person, place, and time.  Psychiatric:        Behavior: Behavior normal.        Thought Content: Thought content normal.        Judgment: Judgment normal.    Lab Results  Component Value Date   WBC 9.1 02/23/2021   HGB 12.8 (L) 02/23/2021   HCT 38.1 (L) 02/23/2021   MCV 93.8 02/23/2021   PLT 215 02/23/2021     Chemistry      Component Value Date/Time   NA 138 02/23/2021 1149   K 4.5 02/23/2021 1149   CL 104 02/23/2021 1149   CO2 27 02/23/2021 1149   BUN 18 02/23/2021 1149   CREATININE 0.85 02/23/2021 1149      Component Value Date/Time   CALCIUM 9.9 02/23/2021 1149   ALKPHOS 75 02/23/2021 1149   AST 24 02/23/2021 1149   ALT 29 02/23/2021 1149   BILITOT 0.5 02/23/2021 1149      Impression and Plan: Mr. Volkman is a very nice 70 year old male. He has a history of thymic carcinoma.  It has  been 7 years since he had problems with thymic cancer.  He has been on treatment in the past with carboplatinum/Taxol.  We then had him on gemcitabine which she did not respond to.  Again, we will now try Adriamycin/Cytoxan.  This I think would be reasonable.  We will give him 3 cycles.  Hopefully, we will see a response.  If not, then we will initiate Hospice for him.  I think he would definitely be a candidate for hospice.  I know that he is trying his best.  He has great support at home.  He has had great support from [place].  He has done everything asked of him.  I know that he and his wife both want quality of life.  We will plan for follow-up in 3 weeks.  If there is any problems before then we will see him back sooner.   Volanda Napoleon, MD 6/3/20225:49 PM

## 2021-02-23 NOTE — Patient Instructions (Signed)
Tunneled Central Venous Catheter Flushing Guide  It is important to flush your tunneled central venous catheter each time you use it, both before and after you use it. Flushing your catheter will help prevent it from clogging. What are the risks? Risks may include:  Infection.  Air getting into the catheter and bloodstream. Supplies needed:  A clean pair of gloves.  A disinfecting wipe. Use an alcohol wipe, chlorhexidine wipe, or iodine wipe as told by your health care provider.  A 10 mL syringe that has been prefilled with saline solution.  An empty 10 mL syringe, if a substance called heparin was injected into your catheter. How to flush your catheter When you flush your catheter, make sure you follow any specific instructions from your health care provider or the manufacturer. These are general guidelines. Flushing your catheter before use If there is heparin in your catheter: 1. Wash your hands with soap and water. 2. Put on gloves. 3. Scrub the injection cap for a minimum of 15 seconds with a disinfecting wipe. 4. Unclamp the catheter. 5. Attach the empty syringe to the injection cap. 6. Pull the syringe plunger back and withdraw 10 mL of blood. 7. Place the syringe into an appropriate waste container. 8. Scrub the injection cap for 15 seconds with a disinfecting wipe. 9. Attach the prefilled syringe to the injection cap. 10. Flush the catheter by pushing the plunger forward until all the liquid from the syringe is in the catheter. 11. Remove the syringe from the injection cap. 12. Clamp the catheter. If there is no heparin in your catheter: 1. Wash your hands with soap and water. 2. Put on gloves. 3. Scrub the injection cap for 15 seconds with a disinfecting wipe. 4. Unclamp the catheter. 5. Attach the prefilled syringe to the injection cap. 6. Flush the catheter by pushing the plunger forward until 5 mL of the liquid from the syringe is in the catheter. 7. Pull back on  the syringe until you see blood in the catheter. 8. If you have been asked to collect any blood, follow your health care provider's instructions. Otherwise, flush the catheter with the rest of the solution from the syringe. 9. Remove the syringe from the injection cap. 10. Clamp the catheter.   Flushing your catheter after use 1. Wash your hands with soap and water. 2. Put on gloves. 3. Scrub the injection cap for 15 seconds with a disinfecting wipe. 4. Unclamp the catheter. 5. Attach the prefilled syringe to the injection cap. 6. Flush the catheter by pushing the plunger forward until all of the liquid from the syringe is in the catheter. 7. Remove the syringe from the injection cap. 8. Clamp the catheter. Problems and solutions  If blood cannot be completely cleared from the injection cap, you may need to have the injection cap replaced.  If the catheter is difficult to flush, use the pulsing method. The pulsing method involves pushing only a few milliliters of solution into the catheter at a time and pausing between pushes.  If you do not see blood in the catheter when you pull back on the syringe, change your body position, such as by raising your arms above your head. Take a deep breath and cough. Then, pull back on the syringe. If you still do not see blood, flush the catheter with a small amount of solution. Then, change positions again and take a breath or cough. Pull back on the syringe again. If you still do not   see blood, finish flushing the catheter and contact your health care provider. Do not use your catheter until your health care provider says it is okay. General tips  Have someone help you flush your catheter, if possible.  Do not force fluid through your catheter.  Do not use a syringe that is larger or smaller than 10 mL. Using a smaller syringe can make the catheter burst.  Do not use your catheter without flushing it first if it has heparin in it. Contact a health  care provider if:  You cannot see any blood in the catheter when you flush it before using it.  Your catheter is difficult to flush. Get help right away if:  You cannot flush the catheter.  The catheter leaks when you flush it or when there is fluid in it.  There are cracks or breaks in the catheter. Summary  It is important to flush your tunneled central venous catheter each time you use it, both before and after you use it.  Scrub the injection cap for 15 seconds with a disinfecting wipe before and after you flush it.  When you flush your catheter, make sure you follow any specific instructions from your health care provider or the manufacturer.  Get help right away if you cannot flush the catheter. This information is not intended to replace advice given to you by your health care provider. Make sure you discuss any questions you have with your health care provider. Document Revised: 11/18/2019 Document Reviewed: 11/25/2018 Elsevier Patient Education  2021 Elsevier Inc.  

## 2021-02-26 ENCOUNTER — Other Ambulatory Visit: Payer: Self-pay

## 2021-02-26 ENCOUNTER — Inpatient Hospital Stay: Payer: Medicare Other

## 2021-02-26 VITALS — BP 138/84 | HR 98 | Temp 97.6°F | Resp 20

## 2021-02-26 DIAGNOSIS — Z5111 Encounter for antineoplastic chemotherapy: Secondary | ICD-10-CM | POA: Diagnosis not present

## 2021-02-26 DIAGNOSIS — C37 Malignant neoplasm of thymus: Secondary | ICD-10-CM

## 2021-02-26 DIAGNOSIS — Z5189 Encounter for other specified aftercare: Secondary | ICD-10-CM | POA: Diagnosis not present

## 2021-02-26 DIAGNOSIS — C7951 Secondary malignant neoplasm of bone: Secondary | ICD-10-CM | POA: Diagnosis not present

## 2021-02-26 DIAGNOSIS — Z79899 Other long term (current) drug therapy: Secondary | ICD-10-CM | POA: Diagnosis not present

## 2021-02-26 MED ORDER — PEGFILGRASTIM-JMDB 6 MG/0.6ML ~~LOC~~ SOSY
PREFILLED_SYRINGE | SUBCUTANEOUS | Status: AC
  Filled 2021-02-26: qty 0.6

## 2021-02-26 MED ORDER — PEGFILGRASTIM-JMDB 6 MG/0.6ML ~~LOC~~ SOSY
6.0000 mg | PREFILLED_SYRINGE | Freq: Once | SUBCUTANEOUS | Status: AC
  Administered 2021-02-26: 6 mg via SUBCUTANEOUS

## 2021-02-26 NOTE — Patient Instructions (Signed)
Pegfilgrastim injection What is this medicine? PEGFILGRASTIM (PEG fil gra stim) is a long-acting granulocyte colony-stimulating factor that stimulates the growth of neutrophils, a type of white blood cell important in the body's fight against infection. It is used to reduce the incidence of fever and infection in patients with certain types of cancer who are receiving chemotherapy that affects the bone marrow, and to increase survival after being exposed to high doses of radiation. This medicine may be used for other purposes; ask your health care provider or pharmacist if you have questions. COMMON BRAND NAME(S): Fulphila, Neulasta, Nyvepria, UDENYCA, Ziextenzo What should I tell my health care provider before I take this medicine? They need to know if you have any of these conditions:  kidney disease  latex allergy  ongoing radiation therapy  sickle cell disease  skin reactions to acrylic adhesives (On-Body Injector only)  an unusual or allergic reaction to pegfilgrastim, filgrastim, other medicines, foods, dyes, or preservatives  pregnant or trying to get pregnant  breast-feeding How should I use this medicine? This medicine is for injection under the skin. If you get this medicine at home, you will be taught how to prepare and give the pre-filled syringe or how to use the On-body Injector. Refer to the patient Instructions for Use for detailed instructions. Use exactly as directed. Tell your healthcare provider immediately if you suspect that the On-body Injector may not have performed as intended or if you suspect the use of the On-body Injector resulted in a missed or partial dose. It is important that you put your used needles and syringes in a special sharps container. Do not put them in a trash can. If you do not have a sharps container, call your pharmacist or healthcare provider to get one. Talk to your pediatrician regarding the use of this medicine in children. While this drug  may be prescribed for selected conditions, precautions do apply. Overdosage: If you think you have taken too much of this medicine contact a poison control center or emergency room at once. NOTE: This medicine is only for you. Do not share this medicine with others. What if I miss a dose? It is important not to miss your dose. Call your doctor or health care professional if you miss your dose. If you miss a dose due to an On-body Injector failure or leakage, a new dose should be administered as soon as possible using a single prefilled syringe for manual use. What may interact with this medicine? Interactions have not been studied. This list may not describe all possible interactions. Give your health care provider a list of all the medicines, herbs, non-prescription drugs, or dietary supplements you use. Also tell them if you smoke, drink alcohol, or use illegal drugs. Some items may interact with your medicine. What should I watch for while using this medicine? Your condition will be monitored carefully while you are receiving this medicine. You may need blood work done while you are taking this medicine. Talk to your health care provider about your risk of cancer. You may be more at risk for certain types of cancer if you take this medicine. If you are going to need a MRI, CT scan, or other procedure, tell your doctor that you are using this medicine (On-Body Injector only). What side effects may I notice from receiving this medicine? Side effects that you should report to your doctor or health care professional as soon as possible:  allergic reactions (skin rash, itching or hives, swelling of   the face, lips, or tongue)  back pain  dizziness  fever  pain, redness, or irritation at site where injected  pinpoint red spots on the skin  red or dark-brown urine  shortness of breath or breathing problems  stomach or side pain, or pain at the shoulder  swelling  tiredness  trouble  passing urine or change in the amount of urine  unusual bruising or bleeding Side effects that usually do not require medical attention (report to your doctor or health care professional if they continue or are bothersome):  bone pain  muscle pain This list may not describe all possible side effects. Call your doctor for medical advice about side effects. You may report side effects to FDA at 1-800-FDA-1088. Where should I keep my medicine? Keep out of the reach of children. If you are using this medicine at home, you will be instructed on how to store it. Throw away any unused medicine after the expiration date on the label. NOTE: This sheet is a summary. It may not cover all possible information. If you have questions about this medicine, talk to your doctor, pharmacist, or health care provider.  2021 Elsevier/Gold Standard (2019-10-01 13:20:51)  

## 2021-03-05 ENCOUNTER — Encounter: Payer: Self-pay | Admitting: Hematology & Oncology

## 2021-03-06 ENCOUNTER — Ambulatory Visit (HOSPITAL_BASED_OUTPATIENT_CLINIC_OR_DEPARTMENT_OTHER)
Admission: RE | Admit: 2021-03-06 | Discharge: 2021-03-06 | Disposition: A | Discharge status: Home / Self Care | Payer: Medicare Other | Source: Ambulatory Visit | Attending: Family | Admitting: Family

## 2021-03-06 ENCOUNTER — Other Ambulatory Visit: Payer: Self-pay

## 2021-03-06 ENCOUNTER — Telehealth: Payer: Self-pay | Admitting: *Deleted

## 2021-03-06 ENCOUNTER — Other Ambulatory Visit: Payer: Self-pay | Admitting: Family

## 2021-03-06 DIAGNOSIS — J90 Pleural effusion, not elsewhere classified: Secondary | ICD-10-CM | POA: Diagnosis not present

## 2021-03-06 DIAGNOSIS — C37 Malignant neoplasm of thymus: Secondary | ICD-10-CM | POA: Diagnosis not present

## 2021-03-06 DIAGNOSIS — J9811 Atelectasis: Secondary | ICD-10-CM | POA: Diagnosis not present

## 2021-03-06 DIAGNOSIS — R062 Wheezing: Secondary | ICD-10-CM

## 2021-03-06 DIAGNOSIS — R0602 Shortness of breath: Secondary | ICD-10-CM | POA: Diagnosis not present

## 2021-03-06 NOTE — Telephone Encounter (Signed)
Reviewed pt my-chart concerns with MD. Robert Kirby pt discussed MD would like pt to have chest XRay, and wheezes in lungs is likely due to the cancer. Pt verbalized understanding. Pt will go to [facility] or [facility] for CXray. Our office will call pt with results.

## 2021-03-07 ENCOUNTER — Encounter: Payer: Self-pay | Admitting: *Deleted

## 2021-03-09 ENCOUNTER — Other Ambulatory Visit: Payer: Medicare Other

## 2021-03-09 ENCOUNTER — Ambulatory Visit: Payer: Medicare Other | Admitting: Hematology & Oncology

## 2021-03-09 ENCOUNTER — Ambulatory Visit: Payer: Medicare Other

## 2021-03-15 ENCOUNTER — Ambulatory Visit: Payer: Medicare Other

## 2021-03-15 ENCOUNTER — Other Ambulatory Visit: Payer: Medicare Other

## 2021-03-16 ENCOUNTER — Other Ambulatory Visit: Payer: Self-pay

## 2021-03-16 ENCOUNTER — Inpatient Hospital Stay: Payer: Medicare Other

## 2021-03-16 ENCOUNTER — Encounter: Payer: Self-pay | Admitting: Hematology & Oncology

## 2021-03-16 ENCOUNTER — Inpatient Hospital Stay (HOSPITAL_BASED_OUTPATIENT_CLINIC_OR_DEPARTMENT_OTHER): Payer: Medicare Other | Admitting: Hematology & Oncology

## 2021-03-16 ENCOUNTER — Telehealth: Payer: Self-pay | Admitting: *Deleted

## 2021-03-16 VITALS — BP 123/84 | HR 102 | Temp 98.3°F | Resp 17 | Wt 199.0 lb

## 2021-03-16 DIAGNOSIS — C37 Malignant neoplasm of thymus: Secondary | ICD-10-CM | POA: Diagnosis not present

## 2021-03-16 DIAGNOSIS — Z79899 Other long term (current) drug therapy: Secondary | ICD-10-CM | POA: Diagnosis not present

## 2021-03-16 DIAGNOSIS — C7951 Secondary malignant neoplasm of bone: Secondary | ICD-10-CM | POA: Diagnosis not present

## 2021-03-16 DIAGNOSIS — Z7901 Long term (current) use of anticoagulants: Secondary | ICD-10-CM | POA: Diagnosis not present

## 2021-03-16 DIAGNOSIS — G0481 Other encephalitis and encephalomyelitis: Secondary | ICD-10-CM

## 2021-03-16 DIAGNOSIS — Z5111 Encounter for antineoplastic chemotherapy: Secondary | ICD-10-CM | POA: Diagnosis not present

## 2021-03-16 DIAGNOSIS — Z5189 Encounter for other specified aftercare: Secondary | ICD-10-CM | POA: Diagnosis not present

## 2021-03-16 LAB — CMP (CANCER CENTER ONLY)
ALT: 22 U/L (ref 0–44)
AST: 20 U/L (ref 15–41)
Albumin: 4.3 g/dL (ref 3.5–5.0)
Alkaline Phosphatase: 70 U/L (ref 38–126)
Anion gap: 8 (ref 5–15)
BUN: 25 mg/dL — ABNORMAL HIGH (ref 8–23)
CO2: 28 mmol/L (ref 22–32)
Calcium: 9.4 mg/dL (ref 8.9–10.3)
Chloride: 100 mmol/L (ref 98–111)
Creatinine: 0.99 mg/dL (ref 0.61–1.24)
GFR, Estimated: >60 mL/min (ref >60)
Glucose, Bld: 91 mg/dL (ref 70–99)
Potassium: 4.1 mmol/L (ref 3.5–5.1)
Sodium: 136 mmol/L (ref 135–145)
Total Bilirubin: 0.3 mg/dL (ref 0.3–1.2)
Total Protein: 6.9 g/dL (ref 6.5–8.1)

## 2021-03-16 LAB — CBC WITH DIFFERENTIAL (CANCER CENTER ONLY)
Abs Immature Granulocytes: 0.06 10*3/uL (ref 0.00–0.07)
Basophils Absolute: 0.1 10*3/uL (ref 0.0–0.1)
Basophils Relative: 1 %
Eosinophils Absolute: 0 10*3/uL (ref 0.0–0.5)
Eosinophils Relative: 0 %
HCT: 39.9 % (ref 39.0–52.0)
Hemoglobin: 13.2 g/dL (ref 13.0–17.0)
Immature Granulocytes: 1 %
Lymphocytes Relative: 16 %
Lymphs Abs: 1 10*3/uL (ref 0.7–4.0)
MCH: 31.3 pg (ref 26.0–34.0)
MCHC: 33.1 g/dL (ref 30.0–36.0)
MCV: 94.5 fL (ref 80.0–100.0)
Monocytes Absolute: 1.2 10*3/uL — ABNORMAL HIGH (ref 0.1–1.0)
Monocytes Relative: 18 %
Neutro Abs: 4.3 10*3/uL (ref 1.7–7.7)
Neutrophils Relative %: 64 %
Platelet Count: 275 10*3/uL (ref 150–400)
RBC: 4.22 MIL/uL (ref 4.22–5.81)
RDW: 16.7 % — ABNORMAL HIGH (ref 11.5–15.5)
WBC Count: 6.6 10*3/uL (ref 4.0–10.5)
nRBC: 0 % (ref 0.0–0.2)

## 2021-03-16 LAB — LACTATE DEHYDROGENASE: LDH: 190 U/L (ref 98–192)

## 2021-03-16 MED ORDER — SODIUM CHLORIDE 0.9 % IV SOLN
Freq: Once | INTRAVENOUS | Status: AC
  Filled 2021-03-16: qty 250

## 2021-03-16 MED ORDER — PALONOSETRON HCL INJECTION 0.25 MG/5ML
0.2500 mg | Freq: Once | INTRAVENOUS | Status: AC
  Administered 2021-03-16: 0.25 mg via INTRAVENOUS

## 2021-03-16 MED ORDER — SODIUM CHLORIDE 0.9 % IV SOLN
150.0000 mg | Freq: Once | INTRAVENOUS | Status: AC
  Administered 2021-03-16: 150 mg via INTRAVENOUS
  Filled 2021-03-16: qty 150

## 2021-03-16 MED ORDER — SODIUM CHLORIDE 0.9 % IV SOLN
600.0000 mg/m2 | Freq: Once | INTRAVENOUS | Status: AC
  Administered 2021-03-16: 1300 mg via INTRAVENOUS
  Filled 2021-03-16: qty 65

## 2021-03-16 MED ORDER — DOXORUBICIN HCL CHEMO IV INJECTION 2 MG/ML
48.0000 mg/m2 | Freq: Once | INTRAVENOUS | Status: AC
  Administered 2021-03-16: 104 mg via INTRAVENOUS
  Filled 2021-03-16: qty 52

## 2021-03-16 MED ORDER — SODIUM CHLORIDE 0.9% FLUSH
10.0000 mL | INTRAVENOUS | Status: DC | PRN
  Administered 2021-03-16: 10 mL
  Filled 2021-03-16: qty 10

## 2021-03-16 MED ORDER — PALONOSETRON HCL INJECTION 0.25 MG/5ML
INTRAVENOUS | Status: AC
  Filled 2021-03-16: qty 5

## 2021-03-16 MED ORDER — HEPARIN SOD (PORK) LOCK FLUSH 100 UNIT/ML IV SOLN
500.0000 [IU] | Freq: Once | INTRAVENOUS | Status: AC | PRN
  Administered 2021-03-16: 500 [IU]
  Filled 2021-03-16: qty 5

## 2021-03-16 MED ORDER — SODIUM CHLORIDE 0.9 % IV SOLN
10.0000 mg | Freq: Once | INTRAVENOUS | Status: AC
  Administered 2021-03-16: 10 mg via INTRAVENOUS
  Filled 2021-03-16: qty 10

## 2021-03-16 NOTE — Progress Notes (Signed)
Okay to treat with HR 102 per Dr. Marin Olp.

## 2021-03-16 NOTE — Patient Instructions (Signed)
[facility]  Discharge Instructions: Thank you for choosing [facility] to provide your oncology and hematology care.   If you have a lab appointment with the [facility], please go directly to the [facility] and check in at the registration area.  Wear comfortable clothing and clothing appropriate for easy access to any Portacath or PICC line.   We strive to give you quality time with your provider. You may need to reschedule your appointment if you arrive late (15 or more minutes).  Arriving late affects you and other patients whose appointments are after yours.  Also, if you miss three or more appointments without notifying the office, you may be dismissed from the clinic at the provider's discretion.      For prescription refill requests, have your pharmacy contact our office and allow 72 hours for refills to be completed.    Today you received the following chemotherapy and/or immunotherapy agents adriamycin, cytoxan      To help prevent nausea and vomiting after your treatment, we encourage you to take your nausea medication as directed.  BELOW ARE SYMPTOMS THAT SHOULD BE REPORTED IMMEDIATELY: *FEVER GREATER THAN 100.4 F (38 C) OR HIGHER *CHILLS OR SWEATING *NAUSEA AND VOMITING THAT IS NOT CONTROLLED WITH YOUR NAUSEA MEDICATION *UNUSUAL SHORTNESS OF BREATH *UNUSUAL BRUISING OR BLEEDING *URINARY PROBLEMS (pain or burning when urinating, or frequent urination) *BOWEL PROBLEMS (unusual diarrhea, constipation, pain near the anus) TENDERNESS IN MOUTH AND THROAT WITH OR WITHOUT PRESENCE OF ULCERS (sore throat, sores in mouth, or a toothache) UNUSUAL RASH, SWELLING OR PAIN  UNUSUAL VAGINAL DISCHARGE OR ITCHING   Items with * indicate a potential emergency and should be followed up as soon as possible or go to the Emergency Department if any problems should occur.  Please show the CHEMOTHERAPY ALERT CARD or IMMUNOTHERAPY ALERT CARD at  check-in to the Emergency Department and triage nurse. Should you have questions after your visit or need to cancel or reschedule your appointment, please contact [facility]  [phone] and follow the prompts.  Office hours are 8:00 a.m. to 4:30 p.m. Monday - Friday. Please note that voicemails left after 4:00 p.m. may not be returned until the following business day.  We are closed weekends and major holidays. You have access to a nurse at all times for urgent questions. Please call the main number to the clinic [phone] and follow the prompts.  For any non-urgent questions, you may also contact your provider using MyChart. We now offer e-Visits for anyone 30 and older to request care online for non-urgent symptoms. For details visit [email or web address].   Also download the MyChart app! Go to the app store, search "MyChart", open the app, select [facility], and log in with your MyChart username and password.  Due to Covid, a mask is required upon entering the hospital/clinic. If you do not have a mask, one will be given to you upon arrival. For doctor visits, patients may have 1 support person aged 84 or older with them. For treatment visits, patients cannot have anyone with them due to current Covid guidelines and our immunocompromised population. Cyclophosphamide Injection What is this medication? CYCLOPHOSPHAMIDE (sye kloe FOSS fa mide) is a chemotherapy drug. It slows the growth of cancer cells. This medicine is used to treat many types of cancer like lymphoma, myeloma, leukemia, breast cancer, and ovarian cancer, to name afew. This medicine may be used for other purposes; ask  your health care provider orpharmacist if you have questions. COMMON BRAND NAME(S): Cytoxan, Neosar What should I tell my care team before I take this medication? They need to know if you have any of these conditions: heart disease history of irregular heartbeat infection kidney  disease liver disease low blood counts, like white cells, platelets, or red blood cells on hemodialysis recent or ongoing radiation therapy scarring or thickening of the lungs trouble passing urine an unusual or allergic reaction to cyclophosphamide, other medicines, foods, dyes, or preservatives pregnant or trying to get pregnant breast-feeding How should I use this medication? This drug is usually given as an injection into a vein or muscle or by infusion into a vein. It is administered in a hospital or clinic by a specially trainedhealth care professional. Talk to your pediatrician regarding the use of this medicine in children.Special care may be needed. Overdosage: If you think you have taken too much of this medicine contact apoison control center or emergency room at once. NOTE: This medicine is only for you. Do not share this medicine with others. What if I miss a dose? It is important not to miss your dose. Call your doctor or health careprofessional if you are unable to keep an appointment. What may interact with this medication? amphotericin B azathioprine certain antivirals for HIV or hepatitis certain medicines for blood pressure, heart disease, irregular heart beat certain medicines that treat or prevent blood clots like warfarin certain other medicines for cancer cyclosporine etanercept indomethacin medicines that relax muscles for surgery medicines to increase blood counts metronidazole This list may not describe all possible interactions. Give your health care provider a list of all the medicines, herbs, non-prescription drugs, or dietary supplements you use. Also tell them if you smoke, drink alcohol, or use illegaldrugs. Some items may interact with your medicine. What should I watch for while using this medication? Your condition will be monitored carefully while you are receiving thismedicine. You may need blood work done while you are taking this  medicine. Drink water or other fluids as directed. Urinate often, even at night. Some products may contain alcohol. Ask your health care professional if this medicine contains alcohol. Be sure to tell all health care professionals you are taking this medicine. Certain medicines, like metronidazole and disulfiram, can cause an unpleasant reaction when taken with alcohol. The reaction includes flushing, headache, nausea, vomiting, sweating, and increased thirst. Thereaction can last from 30 minutes to several hours. Do not become pregnant while taking this medicine or for 1 year after stopping it. Women should inform their health care professional if they wish to become pregnant or think they might be pregnant. Men should not father a child while taking this medicine and for 4 months after stopping it. There is potential for serious side effects to an unborn child. Talk to your health care professionalfor more information. Do not breast-feed an infant while taking this medicine or for 1 week afterstopping it. This medicine has caused ovarian failure in some women. This medicine may make it more difficult to get pregnant. Talk to your health care professional if [place] concerned about your fertility. This medicine has caused decreased sperm counts in some men. This may make it more difficult to father a child. Talk to your health care professional if [place] concerned about your fertility. Call your health care professional for advice if you get a fever, chills, or sore throat, or other symptoms of a cold or flu. Do not treat yourself. This medicine  decreases your body's ability to fight infections. Try to avoid beingaround people who are sick. Avoid taking medicines that contain aspirin, acetaminophen, ibuprofen, naproxen, or ketoprofen unless instructed by your health care professional.These medicines may hide a fever. Talk to your health care professional about your risk of cancer. You may bemore at risk for  certain types of cancer if you take this medicine. If you are going to need surgery or other procedure, tell your health careprofessional that you are using this medicine. Be careful brushing or flossing your teeth or using a toothpick because you may get an infection or bleed more easily. If you have any dental work done, Primary school teacher you are receiving this medicine. What side effects may I notice from receiving this medication? Side effects that you should report to your doctor or health care professionalas soon as possible: allergic reactions like skin rash, itching or hives, swelling of the face, lips, or tongue breathing problems nausea, vomiting signs and symptoms of bleeding such as bloody or black, tarry stools; red or dark brown urine; spitting up blood or brown material that looks like coffee grounds; red spots on the skin; unusual bruising or bleeding from the eyes, gums, or nose signs and symptoms of heart failure like fast, irregular heartbeat, sudden weight gain; swelling of the ankles, feet, hands signs and symptoms of infection like fever; chills; cough; sore throat; pain or trouble passing urine signs and symptoms of kidney injury like trouble passing urine or change in the amount of urine signs and symptoms of liver injury like dark yellow or brown urine; general ill feeling or flu-like symptoms; light-colored stools; loss of appetite; nausea; right upper belly pain; unusually weak or tired; yellowing of the eyes or skin Side effects that usually do not require medical attention (report to yourdoctor or health care professional if they continue or are bothersome): confusion decreased hearing diarrhea facial flushing hair loss headache loss of appetite missed menstrual periods signs and symptoms of low red blood cells or anemia such as unusually weak or tired; feeling faint or lightheaded; falls skin discoloration This list may not describe all possible side effects. Call  your doctor for medical advice about side effects. You may report side effects to FDA at1-800-FDA-1088. Where should I keep my medication? This drug is given in a hospital or clinic and will not be stored at home. NOTE: This sheet is a summary. It may not cover all possible information. If you have questions about this medicine, talk to your doctor, pharmacist, orhealth care provider.  2022 Elsevier/Gold Standard (2019-06-14 09:53:29) Doxorubicin injection What is this medication? DOXORUBICIN (dox oh ROO bi sin) is a chemotherapy drug. It is used to treat many kinds of cancer like leukemia, lymphoma, neuroblastoma, sarcoma, and Wilms' tumor. It is also used to treat bladder cancer, breast cancer, lungcancer, ovarian cancer, stomach cancer, and thyroid cancer. This medicine may be used for other purposes; ask your health care provider orpharmacist if you have questions. COMMON BRAND NAME(S): Adriamycin, Adriamycin PFS, Adriamycin RDF, Rubex What should I tell my care team before I take this medication? They need to know if you have any of these conditions: heart disease history of low blood counts caused by a medicine liver disease recent or ongoing radiation therapy an unusual or allergic reaction to doxorubicin, other chemotherapy agents, other medicines, foods, dyes, or preservatives pregnant or trying to get pregnant breast-feeding How should I use this medication? This drug is given as an infusion into a vein. It  is administered in a hospital or clinic by a specially trained health care professional. If you have pain, swelling, burning or any unusual feeling around the site of your injection,tell your health care professional right away. Talk to your pediatrician regarding the use of this medicine in children.Special care may be needed. Overdosage: If you think you have taken too much of this medicine contact apoison control center or emergency room at once. NOTE: This medicine is only  for you. Do not share this medicine with others. What if I miss a dose? It is important not to miss your dose. Call your doctor or health careprofessional if you are unable to keep an appointment. What may interact with this medication? This medicine may interact with the following medications: 6-mercaptopurine paclitaxel phenytoin St. John's Wort trastuzumab verapamil This list may not describe all possible interactions. Give your health care provider a list of all the medicines, herbs, non-prescription drugs, or dietary supplements you use. Also tell them if you smoke, drink alcohol, or use illegaldrugs. Some items may interact with your medicine. What should I watch for while using this medication? This drug may make you feel generally unwell. This is not uncommon, as chemotherapy can affect healthy cells as well as cancer cells. Report any side effects. Continue your course of treatment even though you feel ill unless yourdoctor tells you to stop. There is a maximum amount of this medicine you should receive throughout your life. The amount depends on the medical condition being treated and your overall health. Your doctor will watch how much of this medicine you receive inyour lifetime. Tell your doctor if you have taken this medicine before. You may need blood work done while you are taking this medicine. Your urine may turn red for a few days after your dose. This is not blood. Ifyour urine is dark or brown, call your doctor. In some cases, you may be given additional medicines to help with side effects.Follow all directions for their use. Call your doctor or health care professional for advice if you get a fever, chills or sore throat, or other symptoms of a cold or flu. Do not treat yourself. This drug decreases your body's ability to fight infections. Try toavoid being around people who are sick. This medicine may increase your risk to bruise or bleed. Call your doctor orhealth care  professional if you notice any unusual bleeding. Talk to your doctor about your risk of cancer. You may be more at risk forcertain types of cancers if you take this medicine. Do not become pregnant while taking this medicine or for 6 months after stopping it. Women should inform their doctor if they wish to become pregnant or think they might be pregnant. Men should not father a child while taking this medicine and for 6 months after stopping it. There is a potential for serious side effects to an unborn child. Talk to your health care professional or pharmacist for more information. Do not breast-feed an infant while takingthis medicine. This medicine has caused ovarian failure in some women and reduced sperm counts in some men This medicine may interfere with the ability to have a child. Talk with your doctor or health care professional if you are concerned about yourfertility. This medicine may cause a decrease in Co-Enzyme Q-10. You should make sure that you get enough Co-Enzyme Q-10 while you are taking this medicine. Discuss thefoods you eat and the vitamins you take with your health care professional. What side effects may I  notice from receiving this medication? Side effects that you should report to your doctor or health care professionalas soon as possible: allergic reactions like skin rash, itching or hives, swelling of the face, lips, or tongue breathing problems chest pain fast or irregular heartbeat low blood counts - this medicine may decrease the number of white blood cells, red blood cells and platelets. You may be at increased risk for infections and bleeding. pain, redness, or irritation at site where injected signs of infection - fever or chills, cough, sore throat, pain or difficulty passing urine signs of decreased platelets or bleeding - bruising, pinpoint red spots on the skin, black, tarry stools, blood in the urine swelling of the ankles, feet,  hands tiredness weakness Side effects that usually do not require medical attention (report to yourdoctor or health care professional if they continue or are bothersome): diarrhea hair loss mouth sores nail discoloration or damage nausea red colored urine vomiting This list may not describe all possible side effects. Call your doctor for medical advice about side effects. You may report side effects to FDA at1-800-FDA-1088. Where should I keep my medication? This drug is given in a hospital or clinic and will not be stored at home. NOTE: This sheet is a summary. It may not cover all possible information. If you have questions about this medicine, talk to your doctor, pharmacist, orhealth care provider.  2022 Elsevier/Gold Standard (2017-04-23 11:01:26)

## 2021-03-16 NOTE — Patient Instructions (Signed)
Implanted Port Home Guide An implanted port is a device that is placed under the skin. It is usually placed in the chest. The device can be used to give IV medicine, to take blood, or for dialysis. You may have an implanted port if: You need IV medicine that would be irritating to the small veins in your hands or arms. You need IV medicines, such as antibiotics, for a long period of time. You need IV nutrition for a long period of time. You need dialysis. When you have a port, your health care provider can choose to use the port instead of veins in your arms for these procedures. You may have fewer limitations when using a port than you would if you used other types of long-term IVs, and you will likely be able to return to normal activities afteryour incision heals. An implanted port has two main parts: Reservoir. The reservoir is the part where a needle is inserted to give medicines or draw blood. The reservoir is round. After it is placed, it appears as a small, raised area under your skin. Catheter. The catheter is a thin, flexible tube that connects the reservoir to a vein. Medicine that is inserted into the reservoir goes into the catheter and then into the vein. How is my port accessed? To access your port: A numbing cream may be placed on the skin over the port site. Your health care provider will put on a mask and sterile gloves. The skin over your port will be cleaned carefully with a germ-killing soap and allowed to dry. Your health care provider will gently pinch the port and insert a needle into it. Your health care provider will check for a blood return to make sure the port is in the vein and is not clogged. If your port needs to remain accessed to get medicine continuously (constant infusion), your health care provider will place a clear bandage (dressing) over the needle site. The dressing and needle will need to be changed every week, or as told by your health care provider. What  is flushing? Flushing helps keep the port from getting clogged. Follow instructions from your health care provider about how and when to flush the port. Ports are usually flushed with saline solution or a medicine called heparin. The need for flushing will depend on how the port is used: If the port is only used from time to time to give medicines or draw blood, the port may need to be flushed: Before and after medicines have been given. Before and after blood has been drawn. As part of routine maintenance. Flushing may be recommended every 4-6 weeks. If a constant infusion is running, the port may not need to be flushed. Throw away any syringes in a disposal container that is meant for sharp items (sharps container). You can buy a sharps container from a pharmacy, or you can make one by using an empty hard plastic bottle with a cover. How long will my port stay implanted? The port can stay in for as long as your health care provider thinks it is needed. When it is time for the port to come out, a surgery will be done to remove it. The surgery will be similar to the procedure that was done to putthe port in. Follow these instructions at home:  Flush your port as told by your health care provider. If you need an infusion over several days, follow instructions from your health care provider about how to take   care of your port site. Make sure you: Wash your hands with soap and water before you change your dressing. If soap and water are not available, use alcohol-based hand sanitizer. Change your dressing as told by your health care provider. Place any used dressings or infusion bags into a plastic bag. Throw that bag in the trash. Keep the dressing that covers the needle clean and dry. Do not get it wet. Do not use scissors or sharp objects near the tube. Keep the tube clamped, unless it is being used. Check your port site every day for signs of infection. Check for: Redness, swelling, or  pain. Fluid or blood. Pus or a bad smell. Protect the skin around the port site. Avoid wearing bra straps that rub or irritate the site. Protect the skin around your port from seat belts. Place a soft pad over your chest if needed. Bathe or shower as told by your health care provider. The site may get wet as long as you are not actively receiving an infusion. Return to your normal activities as told by your health care provider. Ask your health care provider what activities are safe for you. Carry a medical alert card or wear a medical alert bracelet at all times. This will let health care providers know that you have an implanted port in case of an emergency. Get help right away if: You have redness, swelling, or pain at the port site. You have fluid or blood coming from your port site. You have pus or a bad smell coming from the port site. You have a fever. Summary Implanted ports are usually placed in the chest for long-term IV access. Follow instructions from your health care provider about flushing the port and changing bandages (dressings). Take care of the area around your port by avoiding clothing that puts pressure on the area, and by watching for signs of infection. Protect the skin around your port from seat belts. Place a soft pad over your chest if needed. Get help right away if you have a fever or you have redness, swelling, pain, drainage, or a bad smell at the port site. This information is not intended to replace advice given to you by your health care provider. Make sure you discuss any questions you have with your healthcare provider. Document Revised: 01/24/2020 Document Reviewed: 01/24/2020 Elsevier Patient Education  2022 [employer] Inc.  

## 2021-03-16 NOTE — Progress Notes (Signed)
Hematology and Oncology Follow Up Visit  Robert Kirby [id number] May 05, 1951 70 y.o. 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  He really does look quite good.  He had no problems with the first cycle of chemotherapy.  He has little bit of a dry cough.  I suspect this probably is from his tumor in the mediastinal area.  He does have some wheezing, mostly on the left lung.  He does have some inhalers at home.  It would be nice if we use a nebulizer in the office but we cannot because of COVID restrictions.  He says his back is doing little bit better.  He has had no weakness in the back or legs.  He has had no change in bowel or bladder habits.  There is been no fever.  He has had no bleeding.  There is been no mouth sores.  Overall, I would have to say his performance status is probably ECOG 1.    Medications:  Current Outpatient Medications:    busPIRone (BUSPAR) 30 MG tablet, Take 30 mg by mouth 2 (two) times daily., Disp: , Rfl:    carbamazepine (TEGRETOL XR) 200 MG 12 hr tablet, Take 1 tablet (200 mg total) by mouth 2 (two) times daily., Disp: 90 tablet, Rfl: 2   carvedilol (COREG) 3.125 MG tablet, Take 3.125 mg by mouth 2 (two) times daily., Disp: , Rfl:    clonazePAM (KLONOPIN) 0.5 MG tablet, Take 0.5 mg by mouth 2 (two) times daily., Disp: , Rfl:    denosumab (XGEVA) 120 MG/1.7ML SOLN injection, Inject into the skin., Disp: , Rfl:    dexamethasone (DECADRON) 4 MG tablet, Take 2 tablets (8 mg total) by mouth daily. Take daily for 3  days after chemo. Take with food., Disp: 30 tablet, Rfl: 1   dexamethasone (DECADRON) 4 MG tablet, Take 3 tablets (12 mg total) by mouth daily. Take the Decadron daily for 4 days in a row after each chemotherapy session, Disp: 100 tablet, Rfl: 0   DULoxetine (CYMBALTA) 60 MG capsule, Take 120 mg by mouth daily. , Disp: , Rfl:    fluticasone (FLONASE) 50 MCG/ACT nasal spray, SHAKE LIQUID AND USE 2 SPRAYS IN EACH NOSTRIL DAILY, Disp: 48 g, Rfl: 2   furosemide (LASIX) 20 MG tablet, Take 1 tablet (20 mg total) by mouth daily., Disp: 30 tablet, Rfl: 1   gemcitabine (GEMZAR) 1 g injection, Inject into the vein., Disp: , Rfl:    hydrocortisone (CORTEF) 10 MG tablet, TAKE 2 TABLETS IN THE MORNING AND 1 TABLET IN THE EVENING FOR ADDISONS DISEASE, Disp: 270 tablet, Rfl: 3   lidocaine-prilocaine (EMLA) cream, Apply to affected area once, Disp: 30 g, Rfl: 3   LORazepam (ATIVAN) 0.5 MG tablet, Take 1 tablet (0.5 mg total) by mouth every 6 (six) hours as needed (Nausea or vomiting)., Disp: 30 tablet, Rfl: 0   LORazepam (ATIVAN) 1 MG tablet, Take 0.5-1 tablets (0.5-1 mg total) by mouth every 8 (eight) hours as needed for  anxiety., Disp: 30 tablet, Rfl: 0   losartan (COZAAR) 25 MG tablet, Take 25 mg by mouth daily., Disp: , Rfl:    mirtazapine (REMERON) 45 MG tablet, Take by mouth at bedtime., Disp: , Rfl:    ondansetron (ZOFRAN) 8 MG tablet, Take 1 tablet (8 mg total) by mouth 2 (two) times daily as needed. Start on the third day after chemotherapy., Disp: 30 tablet, Rfl: 1   prochlorperazine (COMPAZINE) 10 MG tablet, Take 1 tablet (10 mg total) by mouth every 6 (six) hours as needed (Nausea or vomiting)., Disp: 30 tablet, Rfl: 1   riTUXimab (RITUXAN) 100 MG/10ML injection, Inject into the vein every 6 (six) months., Disp: , Rfl:    rosuvastatin (CRESTOR) 10 MG tablet, Take 1 tablet (10 mg total) by mouth at bedtime., Disp: 90 tablet, Rfl: 3   traMADol (ULTRAM) 50 MG tablet, Take 2 tablets (100 mg total) by mouth  every 6 (six) hours as needed., Disp: 60 tablet, Rfl: 1   warfarin (COUMADIN) 5 MG tablet, Take 2.5 mg by mouth daily., Disp: , Rfl:   Allergies:  Allergies  Allergen Reactions   Other Other (See Comments), Nausea And Vomiting and Nausea Only   Tetracyclines & Related Nausea Only    Past Medical History, Surgical history, Social history, and Family History were reviewed and updated.  Review of Systems: Review of Systems  Constitutional: Negative.   HENT:  Negative.    Eyes: Negative.   Respiratory: Negative.    Cardiovascular: Negative.   Gastrointestinal: Negative.   Endocrine: Negative.   Genitourinary: Negative.    Musculoskeletal: Negative.   Skin: Negative.   Neurological: Negative.   Hematological: Negative.   Psychiatric/Behavioral: Negative.     Physical Exam:  weight is 199 lb (90.3 kg). His oral temperature is 98.3 F (36.8 C). His blood pressure is 123/84 and his pulse is 102 (abnormal). His respiration is 17 and oxygen saturation is 98%.   Wt Readings from Last 3 Encounters:  03/16/21 199 lb (90.3 kg)  02/23/21 206 lb 12.8 oz (93.8 kg)  02/07/21 204 lb (92.5 kg)    Physical Exam Vitals reviewed.  HENT:     Head: Normocephalic and atraumatic.  Eyes:     Pupils: Pupils are equal, round, and reactive to light.  Cardiovascular:     Rate and Rhythm: Normal rate and regular rhythm.     Heart sounds: Normal heart sounds.  Pulmonary:     Effort: Pulmonary effort is normal.     Breath sounds: Normal breath sounds.  Abdominal:     General: Bowel sounds are normal.     Palpations: Abdomen is soft.  Musculoskeletal:        General: No tenderness or deformity. Normal range of motion.     Cervical back: Normal range of motion.  Lymphadenopathy:     Cervical: No cervical adenopathy.  Skin:    General: Skin is warm and dry.     Findings: No erythema or rash.  Neurological:     Mental Status: He is alert and oriented to person, place, and time.  Psychiatric:         Behavior: Behavior normal.        Thought Content: Thought content normal.        Judgment: Judgment normal.   Lab Results  Component Value Date   WBC 6.6 03/16/2021   HGB 13.2 03/16/2021   HCT 39.9 03/16/2021   MCV 94.5 03/16/2021   PLT 275 03/16/2021  Chemistry      Component Value Date/Time   NA 136 03/16/2021 0846   K 4.1 03/16/2021 0846   CL 100 03/16/2021 0846   CO2 28 03/16/2021 0846   BUN 25 (H) 03/16/2021 0846   CREATININE 0.99 03/16/2021 0846      Component Value Date/Time   CALCIUM 9.4 03/16/2021 0846   ALKPHOS 70 03/16/2021 0846   AST 20 03/16/2021 0846   ALT 22 03/16/2021 0846   BILITOT 0.3 03/16/2021 0846      Impression and Plan: Mr. Cousins is a very nice 70 year old male. He has a history of thymic carcinoma.  It has been 7 years since he had problems with thymic cancer.  He has been on treatment in the past with carboplatinum/Taxol.  We then had him on gemcitabine which he did not respond to.  Hopefully, we we will see response with the Adriamycin/Cytoxan protocol.  We will go ahead with the second cycle of treatment.  After his third cycle, we will then plan for his follow-up scans.  Overall, he does seem to be doing well.  Hopefully this is a indicator of response.  We will plan to get him back in 3 weeks for his third cycle of treatment.     Volanda Napoleon, MD 6/24/20229:55 AM

## 2021-03-16 NOTE — Telephone Encounter (Signed)
Per staff message from Dr. Marin Olp, keep the 03/16/21 the same as the 02/07/21 los

## 2021-03-19 ENCOUNTER — Inpatient Hospital Stay: Payer: Medicare Other

## 2021-03-19 ENCOUNTER — Other Ambulatory Visit: Payer: Self-pay

## 2021-03-19 ENCOUNTER — Other Ambulatory Visit: Payer: Self-pay | Admitting: *Deleted

## 2021-03-19 VITALS — BP 117/83 | HR 100 | Temp 98.0°F | Resp 17

## 2021-03-19 DIAGNOSIS — C37 Malignant neoplasm of thymus: Secondary | ICD-10-CM

## 2021-03-19 DIAGNOSIS — C7951 Secondary malignant neoplasm of bone: Secondary | ICD-10-CM | POA: Diagnosis not present

## 2021-03-19 DIAGNOSIS — Z5189 Encounter for other specified aftercare: Secondary | ICD-10-CM | POA: Diagnosis not present

## 2021-03-19 DIAGNOSIS — Z5111 Encounter for antineoplastic chemotherapy: Secondary | ICD-10-CM | POA: Diagnosis not present

## 2021-03-19 DIAGNOSIS — Z79899 Other long term (current) drug therapy: Secondary | ICD-10-CM | POA: Diagnosis not present

## 2021-03-19 MED ORDER — PEGFILGRASTIM-JMDB 6 MG/0.6ML ~~LOC~~ SOSY
6.0000 mg | PREFILLED_SYRINGE | Freq: Once | SUBCUTANEOUS | Status: AC
  Administered 2021-03-19: 6 mg via SUBCUTANEOUS

## 2021-03-19 MED ORDER — PEGFILGRASTIM-JMDB 6 MG/0.6ML ~~LOC~~ SOSY
PREFILLED_SYRINGE | SUBCUTANEOUS | Status: AC
  Filled 2021-03-19: qty 0.6

## 2021-03-19 MED ORDER — TRAMADOL HCL 50 MG PO TABS: 100.0000 mg | ORAL_TABLET | Freq: Four times a day (QID) | ORAL | 1 refills | Status: AC | PRN

## 2021-03-19 NOTE — Patient Instructions (Signed)

## 2021-03-22 ENCOUNTER — Encounter: Payer: Self-pay | Admitting: Hematology & Oncology

## 2021-03-22 DIAGNOSIS — F419 Anxiety disorder, unspecified: Secondary | ICD-10-CM | POA: Diagnosis not present

## 2021-03-22 DIAGNOSIS — Z8659 Personal history of other mental and behavioral disorders: Secondary | ICD-10-CM | POA: Diagnosis not present

## 2021-03-22 DIAGNOSIS — F331 Major depressive disorder, recurrent, moderate: Secondary | ICD-10-CM | POA: Diagnosis not present

## 2021-03-23 ENCOUNTER — Other Ambulatory Visit: Payer: Self-pay

## 2021-03-23 ENCOUNTER — Ambulatory Visit (HOSPITAL_BASED_OUTPATIENT_CLINIC_OR_DEPARTMENT_OTHER)
Admission: RE | Admit: 2021-03-23 | Discharge: 2021-03-23 | Disposition: A | Discharge status: Home / Self Care | Payer: Medicare Other | Source: Ambulatory Visit | Attending: Hematology & Oncology | Admitting: Hematology & Oncology

## 2021-03-23 DIAGNOSIS — C37 Malignant neoplasm of thymus: Secondary | ICD-10-CM | POA: Diagnosis not present

## 2021-03-23 DIAGNOSIS — Z0189 Encounter for other specified special examinations: Secondary | ICD-10-CM

## 2021-03-23 NOTE — Progress Notes (Signed)
*  PRELIMINARY RESULTS* Echocardiogram 2D Echocardiogram has been performed.  Luisa Hart RDCS 03/23/2021, 11:35 AM

## 2021-03-28 ENCOUNTER — Encounter: Payer: Self-pay | Admitting: Hematology & Oncology

## 2021-03-28 ENCOUNTER — Other Ambulatory Visit: Payer: Self-pay

## 2021-03-28 MED ORDER — OXYCODONE HCL 5 MG PO TABS: 5.0000 mg | ORAL_TABLET | ORAL | 0 refills | Status: AC | PRN

## 2021-03-29 ENCOUNTER — Ambulatory Visit: Payer: Medicare Other | Admitting: Hematology & Oncology

## 2021-03-29 ENCOUNTER — Other Ambulatory Visit: Payer: Medicare Other

## 2021-03-29 ENCOUNTER — Ambulatory Visit: Payer: Medicare Other

## 2021-03-30 ENCOUNTER — Encounter: Payer: Self-pay | Admitting: Hematology & Oncology

## 2021-04-01 ENCOUNTER — Telehealth: Payer: Self-pay | Admitting: Family Medicine

## 2021-04-01 DIAGNOSIS — Z20822 Contact with and (suspected) exposure to covid-19: Secondary | ICD-10-CM

## 2021-04-01 MED ORDER — MOLNUPIRAVIR EUA 200MG CAPSULE: 4.0000 | ORAL_CAPSULE | Freq: Two times a day (BID) | ORAL | 0 refills | Status: DC

## 2021-04-01 NOTE — Telephone Encounter (Signed)
10:40 AM Answering service call Wife tested positive for covid today. He has been around her past few days.  Wife on phone to help with history - pt with hx of autoimmune encephalitis, some difficulty with memory.  He does have history of thymic carcinoma, on chemotherapy, status post radiation, takes Neulasta.  Is also on Coumadin for antiphospholipid syndrome. Received initial COVID-vaccine in February and March 2021, booster September 2, second booster April 18th.  History of chronic cough with thymic carcinoma. No new sore throat, fever. Chronic body aches with neulasta/chemo. No new symptoms.  Hone test performed during call - negative.   Advised isolation from spouse, daily testing, and if new symptoms OR positive test to start mulnupiravir (instead of paxlovid d/t meds, placed on hold at pharmacy) and call PCP to discuss other treatment. ER precautions given. Low threshold for ER eval given his medical history. Understanding of plan expressed.

## 2021-04-02 ENCOUNTER — Telehealth: Payer: Self-pay | Admitting: *Deleted

## 2021-04-02 ENCOUNTER — Other Ambulatory Visit: Payer: Self-pay | Admitting: Hematology & Oncology

## 2021-04-02 DIAGNOSIS — C37 Malignant neoplasm of thymus: Secondary | ICD-10-CM

## 2021-04-02 NOTE — Telephone Encounter (Signed)
This nurse called wife an discussed upcoming appointments. The July 13th MD appointment will be by telephone due to [facility] being positive for Covid-19. All other appointments cancelled that day. Dr. Marin Olp is aware of change in schedule. Also, Dr. Marin Olp will start managing the following medications due to Dr. Woody Seller, psychiatrist at [facility] leaving the practice. [facility] has no one to replace him. The following medications are Cymbalta, Remeron, Buspar, Ativan and Klonopin. Robert Kirby verbalized understanding of conversation.

## 2021-04-02 NOTE — Telephone Encounter (Signed)
Error

## 2021-04-03 ENCOUNTER — Telehealth: Payer: Self-pay | Admitting: *Deleted

## 2021-04-03 ENCOUNTER — Other Ambulatory Visit: Payer: Self-pay

## 2021-04-03 ENCOUNTER — Telehealth (INDEPENDENT_AMBULATORY_CARE_PROVIDER_SITE_OTHER): Payer: Medicare Other | Admitting: Family

## 2021-04-03 ENCOUNTER — Telehealth: Payer: Self-pay

## 2021-04-03 ENCOUNTER — Encounter: Payer: Self-pay | Admitting: Family

## 2021-04-03 VITALS — HR 90 | Temp 99.4°F | Ht 72.0 in | Wt 197.0 lb

## 2021-04-03 DIAGNOSIS — Z20822 Contact with and (suspected) exposure to covid-19: Secondary | ICD-10-CM

## 2021-04-03 DIAGNOSIS — U071 COVID-19: Secondary | ICD-10-CM

## 2021-04-03 LAB — ECHOCARDIOGRAM LIMITED: Single Plane A4C EF: 56.8 %

## 2021-04-03 MED ORDER — MOLNUPIRAVIR EUA 200MG CAPSULE: 4.0000 | ORAL_CAPSULE | Freq: Two times a day (BID) | ORAL | 0 refills | Status: AC

## 2021-04-03 NOTE — Telephone Encounter (Signed)
Received a call from wife Robert Kirby that patient has tested positive for COVID today.  Patient with temp 99.1, with cough, fatigued but really doesn't feel too bad.  Referred patient to his primary care for additional COVID therapy.  Advised that appts would need to be pushed out 10 days from today.  Robert Kirby understands and is in agreement

## 2021-04-03 NOTE — Progress Notes (Signed)
Robert Kirby is a 70 y.o. 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     carbamazepine (TEGRETOL XR) 200 MG 12 hr tablet Take 1 tablet (200 mg total) by mouth 2 (two) times daily. 90 tablet 2   carvedilol (COREG) 3.125 MG tablet Take 3.125 mg by mouth 2 (two) times daily.     clonazePAM (KLONOPIN) 0.5 MG tablet Take 0.5 mg by mouth 2 (two) times daily.     denosumab (XGEVA) 120 MG/1.7ML SOLN injection Inject into the skin.     dexamethasone (DECADRON) 4 MG tablet Take 2 tablets (8 mg total) by mouth daily. Take daily for 3 days after chemo. Take with food. 30 tablet 1   dexamethasone (DECADRON) 4 MG tablet Take 3 tablets (12 mg total) by mouth daily. Take the Decadron daily for 4 days in a row after each chemotherapy session 100 tablet 0   DULoxetine (CYMBALTA) 60 MG capsule Take 120 mg by mouth daily.      fluticasone (FLONASE) 50 MCG/ACT nasal spray SHAKE LIQUID AND USE 2 SPRAYS IN EACH NOSTRIL DAILY 48 g 2   furosemide (LASIX) 20 MG tablet Take 1 tablet (20 mg total) by mouth daily. 30 tablet 1   gemcitabine (GEMZAR) 1 g injection Inject into the vein.     hydrocortisone (CORTEF) 10 MG tablet TAKE 2 TABLETS IN THE MORNING AND 1 TABLET IN THE EVENING FOR ADDISONS DISEASE 270 tablet 3   lidocaine-prilocaine (EMLA) cream Apply to  affected area once 30 g 3   LORazepam (ATIVAN) 0.5 MG tablet Take 1 tablet (0.5 mg total) by mouth every 6 (six) hours as needed (Nausea or vomiting). 30 tablet 0   LORazepam (ATIVAN) 1 MG tablet Take 0.5-1 tablets (0.5-1 mg total) by mouth every 8 (eight) hours as needed for anxiety. 30 tablet 0   losartan (COZAAR) 25 MG tablet Take 25 mg by mouth daily.     mirtazapine (REMERON) 45 MG tablet Take by mouth at bedtime.     ondansetron (ZOFRAN) 8 MG tablet Take 1 tablet (8 mg total) by mouth 2 (two) times daily as needed. Start on the third day after chemotherapy. 30 tablet 1   oxyCODONE (OXY IR/ROXICODONE) 5 MG immediate release tablet Take 1 tablet (5 mg total) by mouth every 4 (four) hours as needed for severe pain. 90 tablet 0   prochlorperazine (COMPAZINE) 10 MG tablet Take 1 tablet (10 mg total) by mouth every 6 (six) hours as needed (Nausea or vomiting). 30 tablet 1   riTUXimab (RITUXAN) 100 MG/10ML injection Inject into the vein every 6 (six) months.     rosuvastatin (CRESTOR) 10 MG tablet Take 1 tablet (10 mg total) by mouth at bedtime. 90 tablet 3   traMADol (ULTRAM) 50 MG tablet Take 2 tablets (  100 mg total) by mouth every 6 (six) hours as needed. 60 tablet 1   warfarin (COUMADIN) 5 MG tablet Take 2.5 mg by mouth daily.     molnupiravir EUA 200 mg CAPS Take 4 capsules (800 mg total) by mouth 2 (two) times daily for 5 days. 40 capsule 0   No current facility-administered medications for this visit.    Allergies: Other and Tetracyclines & related  Past Medical History:  Diagnosis Date   Addison's disease ([place])    Antiphospholipid antibody syndrome ([place])    Arrhythmia    Pacemaker   Autoimmune encephalomyelitis    Depression    GERD (gastroesophageal reflux disease)    Goals of care, counseling/discussion 11/24/2020   History of blood transfusion    13 units from a GI Bleed.    History of GI bleed    OSA on CPAP    Sleep apnea    on CPAP   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[place]) 2015    Past  Surgical History:  Procedure Laterality Date   CHOLECYSTECTOMY  2000   COLONOSCOPY  2016   was told to have one in 3 years. From the [facility] [place] [facility] Dr. Amie Critchley   [place] different doctor least before 2014/15   NOSE SURGERY     removed polyps from nose around 1996. Said he broke nose in 4 grade and needed surgery on it   [place]    Family History  Problem Relation Age of Onset   Depression Mother    High blood pressure Daughter    Colon cancer Neg Hx    Esophageal cancer Neg Hx     Social History   Tobacco Use   Smoking status: Some Days    Pack years: 0.00    Types: Cigars   Smokeless tobacco: Never   Tobacco comments:    1 /4 months  Substance Use Topics   Alcohol use: Yes    Comment: rare     Subjective:   I connected with Robert Kirby on 04/03/21 at  2:40 PM EDT by a video enabled telemedicine application and verified that I am speaking with the correct person using two identifiers.   I discussed the limitations of evaluation and management by telemedicine and the availability of in person appointments. The patient expressed understanding and agreed to proceed.  Provider in office/ patient is at home; provider and patient, patient's wife are only 3 people on video call.   Tested positive for COVID on 04/03/21; wife tested positive over the weekend; patient notes he is running a low grade fever but otherwise "feel the same."    Objective:  Vitals:   04/03/21 1425  Pulse: 90  Temp: 99.4 F (37.4 C)  TempSrc: Oral  SpO2: 97%  Weight: 197 lb (89.4 kg)  Height: 6' (1.829 m)    General: Well developed, well nourished, in no acute distress  Skin : Warm and dry.  Head: Normocephalic and atraumatic  Lungs: Respirations unlabored;  Neurologic: Alert and oriented; speech intact; face symmetrical;   Assessment:  1. COVID-19   2. Exposure to COVID-19 virus      Plan:  Rx for Molnupiravir- take as directed; strict ER precautions discussed with both patient and wife if he feels like his breathing is worsening;     No follow-ups on file.  No orders of the defined types were placed in  this encounter.   Requested Prescriptions   Signed Prescriptions Disp Refills   molnupiravir EUA 200 mg CAPS 40 capsule 0    Sig: Take 4 capsules (800 mg total) by mouth 2 (two) times daily for 5 days.

## 2021-04-04 ENCOUNTER — Telehealth: Payer: Self-pay

## 2021-04-04 ENCOUNTER — Ambulatory Visit: Payer: Medicare Other

## 2021-04-04 ENCOUNTER — Other Ambulatory Visit: Payer: Medicare Other

## 2021-04-04 ENCOUNTER — Ambulatory Visit: Payer: Medicare Other | Admitting: Hematology & Oncology

## 2021-04-04 NOTE — Telephone Encounter (Signed)
Tylenol prn. Ice compresses, push fluids. If starting to get short of breath or unable to keep down fluids, go to ER. Ty.

## 2021-04-04 NOTE — Telephone Encounter (Signed)
Nurse Assessment Nurse: Charlie Pitter, RN, Cynder Date/Time Eilene Ghazi Time): 04/03/2021 8:08:58 PM Confirm and document reason for call. If symptomatic, describe symptoms. ---Caller just spoke with RN about husband at 23. He has stage 4 cancer and is on chemotherapy. He is positive for covid today. He was running fever of 101.4. RN told her to take him to ER. He does not want to go to ER. Fever is now at 99.8 after taking Tylenol. Caller has covid herself and cannot go. She is wondering if she should send him anyway. She is concerned about what happens in middle of night. Does the patient have any new or worsening symptoms? ---Yes Will a triage be completed? ---Yes Related visit to physician within the last 2 weeks? ---Yes Does the PT have any chronic conditions? (i.e. diabetes, asthma, this includes High risk factors for pregnancy, etc.) ---Yes List chronic conditions. 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 ---No PLEASE NOTE: All timestamps contained within this report are represented as [place] Standard Time. CONFIDENTIALTY NOTICE: This fax transmission is intended only for the addressee. It contains information that is legally privileged, confidential or otherwise protected from use or disclosure. If you are not the intended recipient, you are strictly prohibited from reviewing, disclosing, copying using or disseminating any of this information or taking any action in reliance on or regarding this information. If you have received this fax in error, please notify us immediately by telephone so that we can arrange for its return to [place]. Phone: [phone], Toll-Free: [phone], Fax: [phone] Page: 2 of 2 Call Id: [id number] Nurse Assessment Guidelines Guideline Title Affirmed Question Affirmed Notes Nurse Date/Time Eilene Ghazi Time) Cancer - Fever [1]  Neutropenia known or suspected (e.g., recent cancer chemotherapy) AND [2] signs or symptoms of suspected infection are present Daffern, RN, Cynder 04/03/2021 8:12:44 PM Disp. Time Eilene Ghazi Time) Disposition Final User 04/03/2021 8:16:23 PM Go to ED Now Yes Daffern, RN, Cynder Caller Disagree/Comply Comply Caller Understands Yes PreDisposition InappropriateToAsk Care Advice Given Per Guideline GO TO ED NOW: BRING MEDICINES: * Bring a list of your current medicines when you go to the Emergency Department (ER). CARE ADVICE given per Cancer - Fever (Adult) guideline. Comments User: Noe Gens, RN Date/Time Eilene Ghazi Time): 04/03/2021 8:12:03 PM on 3L and oxygen at 95% while resting User: Noe Gens, RN Date/Time Eilene Ghazi Time): 04/03/2021 8:14:03 PM [place] this past weekend from being dizzy User: Noe Gens, RN Date/Time Eilene Ghazi Time): 04/03/2021 8:17:27 PM Ran Cancer-fever guidline because COVID guideline was just ran at [place] by previous nurse Referrals [place]

## 2021-04-04 NOTE — Telephone Encounter (Signed)
Nurse Assessment Nurse: Glean Salvo, RN, Magda Paganini Date/Time Eilene Ghazi Time): 04/03/2021 7:01:10 PM Confirm and document reason for call. If symptomatic, describe symptoms. ---Caller states that her husband is currently receiving chemo for lung cancer and in COVID +, now having fever 101.4 oral. Does the patient have any new or worsening symptoms? ---Yes Will a triage be completed? ---Yes Related visit to physician within the last 2 weeks? ---Yes Does the PT have any chronic conditions? (i.e. diabetes, asthma, this includes High risk factors for pregnancy, etc.) ---Yes List chronic conditions. ---Stage 4 lung cancer Is this a behavioral health or substance abuse call? ---No Guidelines Guideline Title Affirmed Question Affirmed Notes Nurse Date/Time (Eastern Time) COVID-19 - Diagnosed or Suspected [1] Fever > 101 F (38.3 C) AND [2] age > 16 years Rebecca Eaton 04/03/2021 7:02:18 PM PLEASE NOTE: All timestamps contained within this report are represented as [place] Standard Time. CONFIDENTIALTY NOTICE: This fax transmission is intended only for the addressee. It contains information that is legally privileged, confidential or otherwise protected from use or disclosure. If you are not the intended recipient, you are strictly prohibited from reviewing, disclosing, copying using or disseminating any of this information or taking any action in reliance on or regarding this information. If you have received this fax in error, please notify us immediately by telephone so that we can arrange for its return to [place]. Phone: [phone], Toll-Free: [phone], Fax: [phone] Page: 2 of 2 Call Id: [id number] [place]. Time Eilene Ghazi Time) Disposition Final User 04/03/2021 6:57:47 PM Send to Urgent Vassie Loll 04/03/2021 7:08:09 PM See HCP within 4 Hours (or PCP triage) Yes Glean Salvo, RN, Christa See Disagree/Comply Comply Caller Understands Yes PreDisposition Did not know what to do Care  Advice Given Per Guideline * IF OFFICE WILL BE CLOSED AND PCP SECOND-LEVEL TRIAGE REQUIRED: You may need to be seen. Your doctor (or NP/PA) will want to talk with you to decide what's best. I'll page the on-call provider now. If you haven't heard from the provider (or me) within 30 minutes, call again. NOTE: If on-call provider can't be reached, send to [facility] or ED. * ED: Patients who may need surgery or hospital admission need to be sent to an ED. So do most patients with serious symptoms or complex medical problems. CALL BACK IF: * You become worse CARE ADVICE given per COVID-19 - DIAGNOSED OR SUSPECTED (Adult) guideline. Referrals GO TO [facility] - ED

## 2021-04-04 NOTE — Telephone Encounter (Signed)
Called the patient informed of PCP instructions. The patient stated his oxygen is now at 95% and temp 97.1 He states he is doing better and did verbalize understanding and agreement of PCP instructions. He did appreciated the phone call.  He agreed to let us know if any changes.

## 2021-04-04 NOTE — Telephone Encounter (Signed)
Patient did a Video visit yesterday 04/03/2021

## 2021-04-06 ENCOUNTER — Ambulatory Visit: Payer: Medicare Other

## 2021-04-09 ENCOUNTER — Encounter: Payer: Self-pay | Admitting: Hematology & Oncology

## 2021-04-16 ENCOUNTER — Encounter: Payer: Self-pay | Admitting: *Deleted

## 2021-04-16 ENCOUNTER — Telehealth: Payer: Self-pay | Admitting: *Deleted

## 2021-04-16 ENCOUNTER — Inpatient Hospital Stay: Payer: Medicare Other

## 2021-04-16 ENCOUNTER — Other Ambulatory Visit: Payer: Self-pay | Admitting: *Deleted

## 2021-04-16 ENCOUNTER — Inpatient Hospital Stay (HOSPITAL_BASED_OUTPATIENT_CLINIC_OR_DEPARTMENT_OTHER): Payer: Medicare Other | Admitting: Hematology & Oncology

## 2021-04-16 ENCOUNTER — Inpatient Hospital Stay: Payer: Medicare Other | Attending: Hematology & Oncology

## 2021-04-16 ENCOUNTER — Ambulatory Visit: Payer: Medicare Other

## 2021-04-16 ENCOUNTER — Encounter: Payer: Self-pay | Admitting: Hematology & Oncology

## 2021-04-16 ENCOUNTER — Other Ambulatory Visit: Payer: Self-pay

## 2021-04-16 VITALS — BP 108/74 | HR 112 | Temp 97.9°F | Resp 19 | Ht 72.0 in | Wt 190.0 lb

## 2021-04-16 DIAGNOSIS — Z79899 Other long term (current) drug therapy: Secondary | ICD-10-CM | POA: Insufficient documentation

## 2021-04-16 DIAGNOSIS — C37 Malignant neoplasm of thymus: Secondary | ICD-10-CM

## 2021-04-16 DIAGNOSIS — Z7952 Long term (current) use of systemic steroids: Secondary | ICD-10-CM | POA: Diagnosis not present

## 2021-04-16 DIAGNOSIS — D6861 Antiphospholipid syndrome: Secondary | ICD-10-CM

## 2021-04-16 DIAGNOSIS — C7951 Secondary malignant neoplasm of bone: Secondary | ICD-10-CM | POA: Insufficient documentation

## 2021-04-16 DIAGNOSIS — Z7901 Long term (current) use of anticoagulants: Secondary | ICD-10-CM | POA: Diagnosis not present

## 2021-04-16 LAB — CBC WITH DIFFERENTIAL (CANCER CENTER ONLY)
Abs Immature Granulocytes: 0.07 10*3/uL (ref 0.00–0.07)
Basophils Absolute: 0 10*3/uL (ref 0.0–0.1)
Basophils Relative: 0 %
Eosinophils Absolute: 0.1 10*3/uL (ref 0.0–0.5)
Eosinophils Relative: 2 %
HCT: 36.9 % — ABNORMAL LOW (ref 39.0–52.0)
Hemoglobin: 12.3 g/dL — ABNORMAL LOW (ref 13.0–17.0)
Immature Granulocytes: 1 %
Lymphocytes Relative: 12 %
Lymphs Abs: 0.9 10*3/uL (ref 0.7–4.0)
MCH: 30.8 pg (ref 26.0–34.0)
MCHC: 33.3 g/dL (ref 30.0–36.0)
MCV: 92.3 fL (ref 80.0–100.0)
Monocytes Absolute: 1.5 10*3/uL — ABNORMAL HIGH (ref 0.1–1.0)
Monocytes Relative: 20 %
Neutro Abs: 4.6 10*3/uL (ref 1.7–7.7)
Neutrophils Relative %: 65 %
Platelet Count: 377 10*3/uL (ref 150–400)
RBC: 4 MIL/uL — ABNORMAL LOW (ref 4.22–5.81)
RDW: 15.2 % (ref 11.5–15.5)
WBC Count: 7.1 10*3/uL (ref 4.0–10.5)
nRBC: 0 % (ref 0.0–0.2)

## 2021-04-16 LAB — CMP (CANCER CENTER ONLY)
ALT: 41 U/L (ref 0–44)
AST: 36 U/L (ref 15–41)
Albumin: 3.9 g/dL (ref 3.5–5.0)
Alkaline Phosphatase: 82 U/L (ref 38–126)
Anion gap: 8 (ref 5–15)
BUN: 16 mg/dL (ref 8–23)
CO2: 31 mmol/L (ref 22–32)
Calcium: 9.4 mg/dL (ref 8.9–10.3)
Chloride: 98 mmol/L (ref 98–111)
Creatinine: 1.09 mg/dL (ref 0.61–1.24)
GFR, Estimated: >60 mL/min (ref >60)
Glucose, Bld: 94 mg/dL (ref 70–99)
Potassium: 4.3 mmol/L (ref 3.5–5.1)
Sodium: 137 mmol/L (ref 135–145)
Total Bilirubin: 0.4 mg/dL (ref 0.3–1.2)
Total Protein: 6.9 g/dL (ref 6.5–8.1)

## 2021-04-16 LAB — PROTIME-INR
INR: 1.8 — ABNORMAL HIGH (ref 0.8–1.2)
Prothrombin Time: 20.7 seconds — ABNORMAL HIGH (ref 11.4–15.2)

## 2021-04-16 LAB — LACTATE DEHYDROGENASE: LDH: 207 U/L — ABNORMAL HIGH (ref 98–192)

## 2021-04-16 NOTE — Telephone Encounter (Signed)
Patient's wife asked if this office could call Dr. Marcie Mowers, Oncology at [facility], and inform him that [place] has decided to stop treatment and go to Hospice. Helene Kelp at [facility] will pass on this message.

## 2021-04-16 NOTE — Telephone Encounter (Signed)
Referral made to [facility] per Dr Marin Olp request.  Call placed.  They will contact family this afternoon.

## 2021-04-16 NOTE — Progress Notes (Signed)
Hematology and Oncology Follow Up Visit  GIOVANIE LEFEBRE [id number] Apr 02, 1951 70 y.o. 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 Torelli is back for follow-up.  Unfortunately, he clearly is declining.  He is very weak today.  He does not want any further treatment.  He is not eating much.  He is losing some weight.  He is having a little bit more pain.  He is on oxycodone.  I think it is apparent that his cancer is progressing.  I do still see that we have to do any additional scans on him right now.  I forgot to mention that he did have COVID.  Both he and his wife had this.  He seemed to get through this.  I really think that were going to have to get hospice involved now.  I do still believe that Mr. Katzenstein will make it through the month of August.  I talked to he and his wife at length.  Their son was listening on the mobile phone.  I explained to them that I did not see that further therapy was going to be of benefit for him.  I just do not want to see him get sick and then have having more detrimental quality of life.  He agrees.  They all agree to Hospice.  We will call Hospice and see what they can do for Mr. Morlock.  I know that he is tried hard.  He has done everything we have asked him to do.  He does not want to be kept alive on machines.  I totally agree with this.  He has a very strong faith.  He is a DO NOT RESUSCITATE.  Overall, I would say his performance status is probably ECOG 3 at best.     Medications:  Current Outpatient Medications:    busPIRone  (BUSPAR) 30 MG tablet, Take 30 mg by mouth 2 (two) times daily., Disp: , Rfl:    carbamazepine (TEGRETOL XR) 200 MG 12 hr tablet, Take 1 tablet (200 mg total) by mouth 2 (two) times daily., Disp: 90 tablet, Rfl: 2   carvedilol (COREG) 3.125 MG tablet, Take 3.125 mg by mouth 2 (two) times daily., Disp: , Rfl:    clonazePAM (KLONOPIN) 0.5 MG tablet, Take 0.5 mg by mouth 2 (two) times daily., Disp: , Rfl:    denosumab (XGEVA) 120 MG/1.7ML SOLN injection, Inject into the skin., Disp: , Rfl:    dexamethasone (DECADRON) 4 MG tablet, Take 2 tablets (8 mg total) by mouth daily. Take daily for 3 days after chemo. Take with food., Disp: 30 tablet, Rfl: 1   dexamethasone (DECADRON) 4 MG tablet, Take 3 tablets (12 mg total) by mouth daily. Take the Decadron daily for 4 days in a row after each chemotherapy session, Disp: 100 tablet, Rfl: 0   DULoxetine (CYMBALTA) 60 MG capsule, Take 120 mg by mouth daily. , Disp: , Rfl:    fluticasone (FLONASE) 50 MCG/ACT nasal spray, SHAKE LIQUID AND USE 2 SPRAYS IN EACH NOSTRIL DAILY, Disp:  48 g, Rfl: 2   furosemide (LASIX) 20 MG tablet, Take 1 tablet (20 mg total) by mouth daily., Disp: 30 tablet, Rfl: 1   gemcitabine (GEMZAR) 1 g injection, Inject into the vein., Disp: , Rfl:    hydrocortisone (CORTEF) 10 MG tablet, TAKE 2 TABLETS IN THE MORNING AND 1 TABLET IN THE EVENING FOR ADDISONS DISEASE, Disp: 270 tablet, Rfl: 3   lidocaine-prilocaine (EMLA) cream, Apply to affected area once, Disp: 30 g, Rfl: 3   LORazepam (ATIVAN) 0.5 MG tablet, Take 1 tablet (0.5 mg total) by mouth every 6 (six) hours as needed (Nausea or vomiting)., Disp: 30 tablet, Rfl: 0   LORazepam (ATIVAN) 1 MG tablet, Take 0.5-1 tablets (0.5-1 mg total) by mouth every 8 (eight) hours as needed for anxiety., Disp: 30 tablet, Rfl: 0   losartan (COZAAR) 25 MG tablet, Take 25 mg by mouth daily., Disp: , Rfl:    ondansetron (ZOFRAN) 8 MG tablet, Take 1 tablet (8 mg total) by mouth 2 (two) times daily as needed.  Start on the third day after chemotherapy., Disp: 30 tablet, Rfl: 1   oxyCODONE (OXY IR/ROXICODONE) 5 MG immediate release tablet, Take 1 tablet (5 mg total) by mouth every 4 (four) hours as needed for severe pain., Disp: 90 tablet, Rfl: 0   prochlorperazine (COMPAZINE) 10 MG tablet, Take 1 tablet (10 mg total) by mouth every 6 (six) hours as needed (Nausea or vomiting)., Disp: 30 tablet, Rfl: 1   riTUXimab (RITUXAN) 100 MG/10ML injection, Inject into the vein every 6 (six) months., Disp: , Rfl:    rosuvastatin (CRESTOR) 10 MG tablet, Take 1 tablet (10 mg total) by mouth at bedtime., Disp: 90 tablet, Rfl: 3   traMADol (ULTRAM) 50 MG tablet, Take 2 tablets (100 mg total) by mouth every 6 (six) hours as needed., Disp: 60 tablet, Rfl: 1   warfarin (COUMADIN) 5 MG tablet, Take 2.5 mg by mouth daily., Disp: , Rfl:    mirtazapine (REMERON) 45 MG tablet, Take by mouth at bedtime., Disp: , Rfl:   Allergies:  Allergies  Allergen Reactions   Other Other (See Comments), Nausea And Vomiting and Nausea Only   Tetracyclines & Related Nausea Only    Past Medical History, Surgical history, Social history, and Family History were reviewed and updated.  Review of Systems: Review of Systems  Constitutional: Negative.   HENT:  Negative.    Eyes: Negative.   Respiratory: Negative.    Cardiovascular: Negative.   Gastrointestinal: Negative.   Endocrine: Negative.   Genitourinary: Negative.    Musculoskeletal: Negative.   Skin: Negative.   Neurological: Negative.   Hematological: Negative.   Psychiatric/Behavioral: Negative.     Physical Exam:  vitals were not taken for this visit.   Wt Readings from Last 3 Encounters:  04/03/21 197 lb (89.4 kg)  03/16/21 199 lb (90.3 kg)  02/23/21 206 lb 12.8 oz (93.8 kg)    Physical Exam Vitals reviewed.  HENT:     Head: Normocephalic and atraumatic.  Eyes:     Pupils: Pupils are equal, round, and reactive to light.  Cardiovascular:     Rate and Rhythm:  Normal rate and regular rhythm.     Heart sounds: Normal heart sounds.  Pulmonary:     Effort: Pulmonary effort is normal.     Breath sounds: Normal breath sounds.  Abdominal:     General: Bowel sounds are normal.     Palpations: Abdomen is soft.  Musculoskeletal:  General: No tenderness or deformity. Normal range of motion.     Cervical back: Normal range of motion.  Lymphadenopathy:     Cervical: No cervical adenopathy.  Skin:    General: Skin is warm and dry.     Findings: No erythema or rash.  Neurological:     Mental Status: He is alert and oriented to person, place, and time.  Psychiatric:        Behavior: Behavior normal.        Thought Content: Thought content normal.        Judgment: Judgment normal.   Lab Results  Component Value Date   WBC 7.1 04/16/2021   HGB 12.3 (L) 04/16/2021   HCT 36.9 (L) 04/16/2021   MCV 92.3 04/16/2021   PLT 377 04/16/2021     Chemistry      Component Value Date/Time   NA 136 03/16/2021 0846   K 4.1 03/16/2021 0846   CL 100 03/16/2021 0846   CO2 28 03/16/2021 0846   BUN 25 (H) 03/16/2021 0846   CREATININE 0.99 03/16/2021 0846      Component Value Date/Time   CALCIUM 9.4 03/16/2021 0846   ALKPHOS 70 03/16/2021 0846   AST 20 03/16/2021 0846   ALT 22 03/16/2021 0846   BILITOT 0.3 03/16/2021 0846      Impression and Plan: Mr. Bonanno is a very nice 70 year old male. He has a history of thymic carcinoma.  It has been 7 years since he had problems with thymic cancer.  He has been on treatment in the past with carboplatinum/Taxol.  We then had him on gemcitabine which he did not respond to.  At this point, our goal is clearly quality of life.  I just want him to have a decent quality of life for what ever time he has left.  Again, I do not expect that he will make it through the month of August.  Our goal is to make sure he is comfortable.  I have sure that Hospice will do a great job with this.  I just do not think we are  going to have to get him back to the office.  I know it can be tough to get him here.  He is on portable oxygen which I think would be very helpful for him.  It has been a true privilege and pleasure to have try to help Mr. Struckman.  He is an inspiration.  His wife is such a strength for him.  She has been a huge advocate and support for him.  I know that he will when and that we will lose.    Volanda Napoleon, MD 7/25/20228:30 AM

## 2021-04-17 ENCOUNTER — Other Ambulatory Visit: Payer: Self-pay | Admitting: Family Medicine

## 2021-04-17 ENCOUNTER — Telehealth: Payer: Self-pay

## 2021-04-17 DIAGNOSIS — E271 Primary adrenocortical insufficiency: Secondary | ICD-10-CM | POA: Diagnosis not present

## 2021-04-17 DIAGNOSIS — Z9989 Dependence on other enabling machines and devices: Secondary | ICD-10-CM | POA: Diagnosis not present

## 2021-04-17 DIAGNOSIS — C37 Malignant neoplasm of thymus: Secondary | ICD-10-CM | POA: Diagnosis not present

## 2021-04-17 DIAGNOSIS — Z9981 Dependence on supplemental oxygen: Secondary | ICD-10-CM | POA: Diagnosis not present

## 2021-04-17 DIAGNOSIS — C7951 Secondary malignant neoplasm of bone: Secondary | ICD-10-CM | POA: Diagnosis not present

## 2021-04-17 DIAGNOSIS — Z8616 Personal history of COVID-19: Secondary | ICD-10-CM | POA: Diagnosis not present

## 2021-04-17 DIAGNOSIS — Z950 Presence of cardiac pacemaker: Secondary | ICD-10-CM | POA: Diagnosis not present

## 2021-04-17 DIAGNOSIS — G0481 Other encephalitis and encephalomyelitis: Secondary | ICD-10-CM | POA: Diagnosis not present

## 2021-04-17 DIAGNOSIS — F32A Depression, unspecified: Secondary | ICD-10-CM | POA: Diagnosis not present

## 2021-04-17 DIAGNOSIS — R634 Abnormal weight loss: Secondary | ICD-10-CM | POA: Diagnosis not present

## 2021-04-17 DIAGNOSIS — G40909 Epilepsy, unspecified, not intractable, without status epilepticus: Secondary | ICD-10-CM | POA: Diagnosis not present

## 2021-04-17 DIAGNOSIS — J309 Allergic rhinitis, unspecified: Secondary | ICD-10-CM | POA: Diagnosis not present

## 2021-04-17 DIAGNOSIS — C787 Secondary malignant neoplasm of liver and intrahepatic bile duct: Secondary | ICD-10-CM | POA: Diagnosis not present

## 2021-04-17 DIAGNOSIS — D6861 Antiphospholipid syndrome: Secondary | ICD-10-CM | POA: Diagnosis not present

## 2021-04-17 DIAGNOSIS — G4733 Obstructive sleep apnea (adult) (pediatric): Secondary | ICD-10-CM | POA: Diagnosis not present

## 2021-04-17 DIAGNOSIS — C7801 Secondary malignant neoplasm of right lung: Secondary | ICD-10-CM | POA: Diagnosis not present

## 2021-04-17 DIAGNOSIS — C7802 Secondary malignant neoplasm of left lung: Secondary | ICD-10-CM | POA: Diagnosis not present

## 2021-04-17 NOTE — Telephone Encounter (Signed)
No 04/16/21 LOS noted   Lyan Moyano

## 2021-04-18 ENCOUNTER — Ambulatory Visit: Payer: Medicare Other

## 2021-04-18 DIAGNOSIS — C7951 Secondary malignant neoplasm of bone: Secondary | ICD-10-CM | POA: Diagnosis not present

## 2021-04-18 DIAGNOSIS — C787 Secondary malignant neoplasm of liver and intrahepatic bile duct: Secondary | ICD-10-CM | POA: Diagnosis not present

## 2021-04-18 DIAGNOSIS — C37 Malignant neoplasm of thymus: Secondary | ICD-10-CM | POA: Diagnosis not present

## 2021-04-18 DIAGNOSIS — C7802 Secondary malignant neoplasm of left lung: Secondary | ICD-10-CM | POA: Diagnosis not present

## 2021-04-18 DIAGNOSIS — Z8616 Personal history of COVID-19: Secondary | ICD-10-CM | POA: Diagnosis not present

## 2021-04-18 DIAGNOSIS — C7801 Secondary malignant neoplasm of right lung: Secondary | ICD-10-CM | POA: Diagnosis not present

## 2021-04-19 DIAGNOSIS — C787 Secondary malignant neoplasm of liver and intrahepatic bile duct: Secondary | ICD-10-CM | POA: Diagnosis not present

## 2021-04-19 DIAGNOSIS — Z950 Presence of cardiac pacemaker: Secondary | ICD-10-CM | POA: Diagnosis not present

## 2021-04-19 DIAGNOSIS — C37 Malignant neoplasm of thymus: Secondary | ICD-10-CM | POA: Diagnosis not present

## 2021-04-19 DIAGNOSIS — C7801 Secondary malignant neoplasm of right lung: Secondary | ICD-10-CM | POA: Diagnosis not present

## 2021-04-19 DIAGNOSIS — C7951 Secondary malignant neoplasm of bone: Secondary | ICD-10-CM | POA: Diagnosis not present

## 2021-04-19 DIAGNOSIS — C7802 Secondary malignant neoplasm of left lung: Secondary | ICD-10-CM | POA: Diagnosis not present

## 2021-04-19 DIAGNOSIS — Z8616 Personal history of COVID-19: Secondary | ICD-10-CM | POA: Diagnosis not present

## 2021-04-23 ENCOUNTER — Ambulatory Visit (HOSPITAL_COMMUNITY): Payer: Medicare Other

## 2021-04-23 DIAGNOSIS — E271 Primary adrenocortical insufficiency: Secondary | ICD-10-CM | POA: Diagnosis not present

## 2021-04-23 DIAGNOSIS — G40909 Epilepsy, unspecified, not intractable, without status epilepticus: Secondary | ICD-10-CM | POA: Diagnosis not present

## 2021-04-23 DIAGNOSIS — D6861 Antiphospholipid syndrome: Secondary | ICD-10-CM | POA: Diagnosis not present

## 2021-04-23 DIAGNOSIS — Z9981 Dependence on supplemental oxygen: Secondary | ICD-10-CM | POA: Diagnosis not present

## 2021-04-23 DIAGNOSIS — C7802 Secondary malignant neoplasm of left lung: Secondary | ICD-10-CM | POA: Diagnosis not present

## 2021-04-23 DIAGNOSIS — C7801 Secondary malignant neoplasm of right lung: Secondary | ICD-10-CM | POA: Diagnosis not present

## 2021-04-23 DIAGNOSIS — F32A Depression, unspecified: Secondary | ICD-10-CM | POA: Diagnosis not present

## 2021-04-23 DIAGNOSIS — C37 Malignant neoplasm of thymus: Secondary | ICD-10-CM | POA: Diagnosis not present

## 2021-04-23 DIAGNOSIS — R634 Abnormal weight loss: Secondary | ICD-10-CM | POA: Diagnosis not present

## 2021-04-23 DIAGNOSIS — C7951 Secondary malignant neoplasm of bone: Secondary | ICD-10-CM | POA: Diagnosis not present

## 2021-04-23 DIAGNOSIS — Z9989 Dependence on other enabling machines and devices: Secondary | ICD-10-CM | POA: Diagnosis not present

## 2021-04-23 DIAGNOSIS — Z950 Presence of cardiac pacemaker: Secondary | ICD-10-CM | POA: Diagnosis not present

## 2021-04-23 DIAGNOSIS — Z7901 Long term (current) use of anticoagulants: Secondary | ICD-10-CM | POA: Diagnosis not present

## 2021-04-23 DIAGNOSIS — G0481 Other encephalitis and encephalomyelitis: Secondary | ICD-10-CM | POA: Diagnosis not present

## 2021-04-23 DIAGNOSIS — C787 Secondary malignant neoplasm of liver and intrahepatic bile duct: Secondary | ICD-10-CM | POA: Diagnosis not present

## 2021-04-23 DIAGNOSIS — G4733 Obstructive sleep apnea (adult) (pediatric): Secondary | ICD-10-CM | POA: Diagnosis not present

## 2021-04-23 DIAGNOSIS — Z8616 Personal history of COVID-19: Secondary | ICD-10-CM | POA: Diagnosis not present

## 2021-04-23 DIAGNOSIS — J309 Allergic rhinitis, unspecified: Secondary | ICD-10-CM | POA: Diagnosis not present

## 2021-04-24 DIAGNOSIS — C7951 Secondary malignant neoplasm of bone: Secondary | ICD-10-CM | POA: Diagnosis not present

## 2021-04-24 DIAGNOSIS — C787 Secondary malignant neoplasm of liver and intrahepatic bile duct: Secondary | ICD-10-CM | POA: Diagnosis not present

## 2021-04-24 DIAGNOSIS — Z8616 Personal history of COVID-19: Secondary | ICD-10-CM | POA: Diagnosis not present

## 2021-04-24 DIAGNOSIS — C7801 Secondary malignant neoplasm of right lung: Secondary | ICD-10-CM | POA: Diagnosis not present

## 2021-04-24 DIAGNOSIS — C37 Malignant neoplasm of thymus: Secondary | ICD-10-CM | POA: Diagnosis not present

## 2021-04-24 DIAGNOSIS — C7802 Secondary malignant neoplasm of left lung: Secondary | ICD-10-CM | POA: Diagnosis not present

## 2021-04-27 DIAGNOSIS — C787 Secondary malignant neoplasm of liver and intrahepatic bile duct: Secondary | ICD-10-CM | POA: Diagnosis not present

## 2021-04-27 DIAGNOSIS — C7801 Secondary malignant neoplasm of right lung: Secondary | ICD-10-CM | POA: Diagnosis not present

## 2021-04-27 DIAGNOSIS — Z8616 Personal history of COVID-19: Secondary | ICD-10-CM | POA: Diagnosis not present

## 2021-04-27 DIAGNOSIS — C7802 Secondary malignant neoplasm of left lung: Secondary | ICD-10-CM | POA: Diagnosis not present

## 2021-04-27 DIAGNOSIS — C37 Malignant neoplasm of thymus: Secondary | ICD-10-CM | POA: Diagnosis not present

## 2021-04-27 DIAGNOSIS — C7951 Secondary malignant neoplasm of bone: Secondary | ICD-10-CM | POA: Diagnosis not present

## 2021-04-30 DIAGNOSIS — C7802 Secondary malignant neoplasm of left lung: Secondary | ICD-10-CM | POA: Diagnosis not present

## 2021-04-30 DIAGNOSIS — C7951 Secondary malignant neoplasm of bone: Secondary | ICD-10-CM | POA: Diagnosis not present

## 2021-04-30 DIAGNOSIS — C7801 Secondary malignant neoplasm of right lung: Secondary | ICD-10-CM | POA: Diagnosis not present

## 2021-04-30 DIAGNOSIS — Z8616 Personal history of COVID-19: Secondary | ICD-10-CM | POA: Diagnosis not present

## 2021-04-30 DIAGNOSIS — C787 Secondary malignant neoplasm of liver and intrahepatic bile duct: Secondary | ICD-10-CM | POA: Diagnosis not present

## 2021-04-30 DIAGNOSIS — C37 Malignant neoplasm of thymus: Secondary | ICD-10-CM | POA: Diagnosis not present

## 2021-05-03 DIAGNOSIS — C787 Secondary malignant neoplasm of liver and intrahepatic bile duct: Secondary | ICD-10-CM | POA: Diagnosis not present

## 2021-05-03 DIAGNOSIS — C37 Malignant neoplasm of thymus: Secondary | ICD-10-CM | POA: Diagnosis not present

## 2021-05-03 DIAGNOSIS — C7801 Secondary malignant neoplasm of right lung: Secondary | ICD-10-CM | POA: Diagnosis not present

## 2021-05-03 DIAGNOSIS — C7951 Secondary malignant neoplasm of bone: Secondary | ICD-10-CM | POA: Diagnosis not present

## 2021-05-03 DIAGNOSIS — C7802 Secondary malignant neoplasm of left lung: Secondary | ICD-10-CM | POA: Diagnosis not present

## 2021-05-03 DIAGNOSIS — Z8616 Personal history of COVID-19: Secondary | ICD-10-CM | POA: Diagnosis not present

## 2021-05-08 DIAGNOSIS — C37 Malignant neoplasm of thymus: Secondary | ICD-10-CM | POA: Diagnosis not present

## 2021-05-08 DIAGNOSIS — C7802 Secondary malignant neoplasm of left lung: Secondary | ICD-10-CM | POA: Diagnosis not present

## 2021-05-08 DIAGNOSIS — C787 Secondary malignant neoplasm of liver and intrahepatic bile duct: Secondary | ICD-10-CM | POA: Diagnosis not present

## 2021-05-08 DIAGNOSIS — Z8616 Personal history of COVID-19: Secondary | ICD-10-CM | POA: Diagnosis not present

## 2021-05-08 DIAGNOSIS — C7951 Secondary malignant neoplasm of bone: Secondary | ICD-10-CM | POA: Diagnosis not present

## 2021-05-08 DIAGNOSIS — C7801 Secondary malignant neoplasm of right lung: Secondary | ICD-10-CM | POA: Diagnosis not present

## 2021-05-09 ENCOUNTER — Other Ambulatory Visit: Payer: Self-pay | Admitting: Family Medicine

## 2021-05-09 MED ORDER — BUSPIRONE HCL 30 MG PO TABS: 30.0000 mg | ORAL_TABLET | Freq: Two times a day (BID) | ORAL | 0 refills | Status: DC

## 2021-05-09 MED ORDER — MIRTAZAPINE 45 MG PO TABS: 45.0000 mg | ORAL_TABLET | Freq: Every day | ORAL | 0 refills | Status: AC

## 2021-05-09 MED ORDER — DULOXETINE HCL 60 MG PO CPEP: 120.0000 mg | ORAL_CAPSULE | Freq: Every day | ORAL | 0 refills | Status: DC

## 2021-05-10 DIAGNOSIS — C7802 Secondary malignant neoplasm of left lung: Secondary | ICD-10-CM | POA: Diagnosis not present

## 2021-05-10 DIAGNOSIS — C7801 Secondary malignant neoplasm of right lung: Secondary | ICD-10-CM | POA: Diagnosis not present

## 2021-05-10 DIAGNOSIS — C7951 Secondary malignant neoplasm of bone: Secondary | ICD-10-CM | POA: Diagnosis not present

## 2021-05-10 DIAGNOSIS — C787 Secondary malignant neoplasm of liver and intrahepatic bile duct: Secondary | ICD-10-CM | POA: Diagnosis not present

## 2021-05-10 DIAGNOSIS — C37 Malignant neoplasm of thymus: Secondary | ICD-10-CM | POA: Diagnosis not present

## 2021-05-10 DIAGNOSIS — Z8616 Personal history of COVID-19: Secondary | ICD-10-CM | POA: Diagnosis not present

## 2021-05-14 DIAGNOSIS — C787 Secondary malignant neoplasm of liver and intrahepatic bile duct: Secondary | ICD-10-CM | POA: Diagnosis not present

## 2021-05-14 DIAGNOSIS — Z8616 Personal history of COVID-19: Secondary | ICD-10-CM | POA: Diagnosis not present

## 2021-05-14 DIAGNOSIS — C37 Malignant neoplasm of thymus: Secondary | ICD-10-CM | POA: Diagnosis not present

## 2021-05-14 DIAGNOSIS — C7802 Secondary malignant neoplasm of left lung: Secondary | ICD-10-CM | POA: Diagnosis not present

## 2021-05-14 DIAGNOSIS — C7801 Secondary malignant neoplasm of right lung: Secondary | ICD-10-CM | POA: Diagnosis not present

## 2021-05-14 DIAGNOSIS — C7951 Secondary malignant neoplasm of bone: Secondary | ICD-10-CM | POA: Diagnosis not present

## 2021-05-16 ENCOUNTER — Telehealth: Payer: Self-pay | Admitting: Family Medicine

## 2021-05-16 NOTE — Telephone Encounter (Signed)
PA for Buspirone HCL 30 MG Tablet approved through today 05/16/21 through 09/22/2021

## 2021-05-17 DIAGNOSIS — Z8616 Personal history of COVID-19: Secondary | ICD-10-CM | POA: Diagnosis not present

## 2021-05-17 DIAGNOSIS — C7801 Secondary malignant neoplasm of right lung: Secondary | ICD-10-CM | POA: Diagnosis not present

## 2021-05-17 DIAGNOSIS — C7951 Secondary malignant neoplasm of bone: Secondary | ICD-10-CM | POA: Diagnosis not present

## 2021-05-17 DIAGNOSIS — C787 Secondary malignant neoplasm of liver and intrahepatic bile duct: Secondary | ICD-10-CM | POA: Diagnosis not present

## 2021-05-17 DIAGNOSIS — C7802 Secondary malignant neoplasm of left lung: Secondary | ICD-10-CM | POA: Diagnosis not present

## 2021-05-17 DIAGNOSIS — C37 Malignant neoplasm of thymus: Secondary | ICD-10-CM | POA: Diagnosis not present

## 2021-05-18 DIAGNOSIS — C7951 Secondary malignant neoplasm of bone: Secondary | ICD-10-CM | POA: Diagnosis not present

## 2021-05-18 DIAGNOSIS — C37 Malignant neoplasm of thymus: Secondary | ICD-10-CM | POA: Diagnosis not present

## 2021-05-18 DIAGNOSIS — C7801 Secondary malignant neoplasm of right lung: Secondary | ICD-10-CM | POA: Diagnosis not present

## 2021-05-18 DIAGNOSIS — C7802 Secondary malignant neoplasm of left lung: Secondary | ICD-10-CM | POA: Diagnosis not present

## 2021-05-18 DIAGNOSIS — C787 Secondary malignant neoplasm of liver and intrahepatic bile duct: Secondary | ICD-10-CM | POA: Diagnosis not present

## 2021-05-18 DIAGNOSIS — Z8616 Personal history of COVID-19: Secondary | ICD-10-CM | POA: Diagnosis not present

## 2021-05-21 DIAGNOSIS — C787 Secondary malignant neoplasm of liver and intrahepatic bile duct: Secondary | ICD-10-CM | POA: Diagnosis not present

## 2021-05-21 DIAGNOSIS — C37 Malignant neoplasm of thymus: Secondary | ICD-10-CM | POA: Diagnosis not present

## 2021-05-21 DIAGNOSIS — C7951 Secondary malignant neoplasm of bone: Secondary | ICD-10-CM | POA: Diagnosis not present

## 2021-05-21 DIAGNOSIS — C7802 Secondary malignant neoplasm of left lung: Secondary | ICD-10-CM | POA: Diagnosis not present

## 2021-05-21 DIAGNOSIS — Z8616 Personal history of COVID-19: Secondary | ICD-10-CM | POA: Diagnosis not present

## 2021-05-21 DIAGNOSIS — C7801 Secondary malignant neoplasm of right lung: Secondary | ICD-10-CM | POA: Diagnosis not present

## 2021-05-23 DIAGNOSIS — Z8616 Personal history of COVID-19: Secondary | ICD-10-CM | POA: Diagnosis not present

## 2021-05-23 DIAGNOSIS — C37 Malignant neoplasm of thymus: Secondary | ICD-10-CM | POA: Diagnosis not present

## 2021-05-23 DIAGNOSIS — C7801 Secondary malignant neoplasm of right lung: Secondary | ICD-10-CM | POA: Diagnosis not present

## 2021-05-23 DIAGNOSIS — C7951 Secondary malignant neoplasm of bone: Secondary | ICD-10-CM | POA: Diagnosis not present

## 2021-05-23 DIAGNOSIS — C787 Secondary malignant neoplasm of liver and intrahepatic bile duct: Secondary | ICD-10-CM | POA: Diagnosis not present

## 2021-05-23 DIAGNOSIS — C7802 Secondary malignant neoplasm of left lung: Secondary | ICD-10-CM | POA: Diagnosis not present

## 2021-05-24 ENCOUNTER — Encounter: Payer: Self-pay | Admitting: Hematology & Oncology

## 2021-05-24 DIAGNOSIS — G40909 Epilepsy, unspecified, not intractable, without status epilepticus: Secondary | ICD-10-CM | POA: Diagnosis not present

## 2021-05-24 DIAGNOSIS — C787 Secondary malignant neoplasm of liver and intrahepatic bile duct: Secondary | ICD-10-CM | POA: Diagnosis not present

## 2021-05-24 DIAGNOSIS — E271 Primary adrenocortical insufficiency: Secondary | ICD-10-CM | POA: Diagnosis not present

## 2021-05-24 DIAGNOSIS — F32A Depression, unspecified: Secondary | ICD-10-CM | POA: Diagnosis not present

## 2021-05-24 DIAGNOSIS — G0481 Other encephalitis and encephalomyelitis: Secondary | ICD-10-CM | POA: Diagnosis not present

## 2021-05-24 DIAGNOSIS — Z8616 Personal history of COVID-19: Secondary | ICD-10-CM | POA: Diagnosis not present

## 2021-05-24 DIAGNOSIS — C7801 Secondary malignant neoplasm of right lung: Secondary | ICD-10-CM | POA: Diagnosis not present

## 2021-05-24 DIAGNOSIS — D6861 Antiphospholipid syndrome: Secondary | ICD-10-CM | POA: Diagnosis not present

## 2021-05-24 DIAGNOSIS — C37 Malignant neoplasm of thymus: Secondary | ICD-10-CM | POA: Diagnosis not present

## 2021-05-24 DIAGNOSIS — R634 Abnormal weight loss: Secondary | ICD-10-CM | POA: Diagnosis not present

## 2021-05-24 DIAGNOSIS — Z950 Presence of cardiac pacemaker: Secondary | ICD-10-CM | POA: Diagnosis not present

## 2021-05-24 DIAGNOSIS — Z9989 Dependence on other enabling machines and devices: Secondary | ICD-10-CM | POA: Diagnosis not present

## 2021-05-24 DIAGNOSIS — Z7901 Long term (current) use of anticoagulants: Secondary | ICD-10-CM | POA: Diagnosis not present

## 2021-05-24 DIAGNOSIS — J309 Allergic rhinitis, unspecified: Secondary | ICD-10-CM | POA: Diagnosis not present

## 2021-05-24 DIAGNOSIS — C7802 Secondary malignant neoplasm of left lung: Secondary | ICD-10-CM | POA: Diagnosis not present

## 2021-05-24 DIAGNOSIS — Z9981 Dependence on supplemental oxygen: Secondary | ICD-10-CM | POA: Diagnosis not present

## 2021-05-24 DIAGNOSIS — G4733 Obstructive sleep apnea (adult) (pediatric): Secondary | ICD-10-CM | POA: Diagnosis not present

## 2021-05-24 DIAGNOSIS — C7951 Secondary malignant neoplasm of bone: Secondary | ICD-10-CM | POA: Diagnosis not present

## 2021-05-25 ENCOUNTER — Other Ambulatory Visit (HOSPITAL_BASED_OUTPATIENT_CLINIC_OR_DEPARTMENT_OTHER): Payer: Self-pay

## 2021-05-25 ENCOUNTER — Encounter: Payer: Self-pay | Admitting: Hematology & Oncology

## 2021-05-25 DIAGNOSIS — C787 Secondary malignant neoplasm of liver and intrahepatic bile duct: Secondary | ICD-10-CM | POA: Diagnosis not present

## 2021-05-25 DIAGNOSIS — C7951 Secondary malignant neoplasm of bone: Secondary | ICD-10-CM | POA: Diagnosis not present

## 2021-05-25 DIAGNOSIS — C7802 Secondary malignant neoplasm of left lung: Secondary | ICD-10-CM | POA: Diagnosis not present

## 2021-05-25 DIAGNOSIS — C37 Malignant neoplasm of thymus: Secondary | ICD-10-CM | POA: Diagnosis not present

## 2021-05-25 DIAGNOSIS — Z8616 Personal history of COVID-19: Secondary | ICD-10-CM | POA: Diagnosis not present

## 2021-05-25 DIAGNOSIS — C7801 Secondary malignant neoplasm of right lung: Secondary | ICD-10-CM | POA: Diagnosis not present

## 2021-05-26 ENCOUNTER — Encounter: Payer: Self-pay | Admitting: Hematology & Oncology

## 2021-05-27 ENCOUNTER — Encounter: Payer: Self-pay | Admitting: Hematology & Oncology

## 2021-05-28 ENCOUNTER — Encounter: Payer: Self-pay | Admitting: Hematology & Oncology

## 2021-05-29 ENCOUNTER — Encounter: Payer: Self-pay | Admitting: Hematology & Oncology

## 2021-05-29 DIAGNOSIS — C7801 Secondary malignant neoplasm of right lung: Secondary | ICD-10-CM | POA: Diagnosis not present

## 2021-05-29 DIAGNOSIS — C37 Malignant neoplasm of thymus: Secondary | ICD-10-CM | POA: Diagnosis not present

## 2021-05-29 DIAGNOSIS — C787 Secondary malignant neoplasm of liver and intrahepatic bile duct: Secondary | ICD-10-CM | POA: Diagnosis not present

## 2021-05-29 DIAGNOSIS — C7802 Secondary malignant neoplasm of left lung: Secondary | ICD-10-CM | POA: Diagnosis not present

## 2021-05-29 DIAGNOSIS — Z8616 Personal history of COVID-19: Secondary | ICD-10-CM | POA: Diagnosis not present

## 2021-05-29 DIAGNOSIS — C7951 Secondary malignant neoplasm of bone: Secondary | ICD-10-CM | POA: Diagnosis not present

## 2021-05-30 ENCOUNTER — Encounter: Payer: Self-pay | Admitting: Hematology & Oncology

## 2021-05-30 NOTE — Progress Notes (Deleted)
M married, Company secretary, never smoker. Previously followed by Dr Lake Bells in 2020- Synopsis: Referred to [name] in 09/2018 fordyspnea.  He has a complex medical history which includes obstructive sleep apnea on CPAP, a diagnosis  of thymic carcinoma treated with chemo therapy and radiation in 2015.  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  This was treated with prednisone.  He has followed at [place] and lives in [place]. 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 Pacemaker for complete heart block Seasonal Allergic Rhinitis, Paralyzed hemidiaphragm. Original sleep study in [place] with new CPAP about 2018. Unknown settings.  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  This was treated with prednisone.  He has followed at [facility] and lives in [place]. 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 Pacemaker for complete heart block Seasonal Allergic Rhinitis, Paralyzed hemidiaphragm. Covid infection Oct 2021,  CPAP auto 13/ [employer],    O2 [employer] 2L (here on room air) Download- compliance 33%, - last was Feb 11. AHI 1.9/  hr Body weight today- Covid vax-3 [employer]                  Wife here Flu vax- had [facility] follows for widely metastatic disease -recurrent thymic cancer -----Patient feels that breathing is the same. Covid in October, when he bends over he huffs and puffs for air. Does fine with walking or exertion. Jan he finished 10 radiation treatments and meets with Oncology tomorrow for Chemo treatments.   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 Had spot XRT to spine.  He has notices bulging right lateral abdominal wall, which he shows me. There is a prominent superficial varix over the area. I suspect the XRT or the spinal tumor has affected nerves causing muscle weakness to that area. He notes some numbness medially. Dyspnea bending over reflects diaphragm weakness. He has home O2 concentrator, not used much and asks if he needs it.  Waking with gastric air bubble, so he is swallowing some arir at current CPAP pressure.  Has been leaving CPAP off more.  05/31/21- 67 yoM never smoker followed for OSA, RBD, complicated by  thymic carcinoma treated with chemo therapy and radiation in 2015., Autoimmune Encephalitis with Seizure treated with plasmapheresis and rituximab.  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  This was treated with prednisone.  He has followed at [place] and lives in [place]. 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 Pacemaker for complete heart block Seasonal Allergic Rhinitis, Paralyzed hemidiaphragm. Covid infection Oct 2021,  CPAP auto 13/ [employer],    O2 [employer] 2L (here on room air) Download- Body weight today- Covid vax-  CXR 03/06/21- IMPRESSION: Multiple known pleural-based metastases were better appreciated on the  recent prior PET-CT of 02/07/2021. The dominant pleural based metastasis within the left lower  thorax appears similar to minimally decreased in size as compared to the chest radiograph of 01/16/2021. Loculated right pleural effusion with associated right mid and basilar atelectasis, not significantly changed as compared to the chest radiograph 01/16/2021. Underlying consolidation is difficult to exclude by radiography.  PET 02/07/21- IMPRESSION: 1. Progression metastatic thymic carcinoma. 2. Pleural base nodular masses are similar in size but increased in metabolic activity. Differing scanners could account for some metabolic differences but the increased metabolic activity is favored real. 3. Pleural metastasis in the RIGHT costophrenic angle and involve the RIGHT chest wall similar. 4. Interval increase in size and metabolic activity of solitary hepatic metastasis. 5. New soft tissue metastasis which extends into the RIGHT paraspinal space at the T12 vertebral body level. Activity extends through the neural foramina and central canal. Lesion also extends into the vertebral body. Consider contrast MRI of the lower thoracic spine if patient is having neurologic symptoms.    ROS-see HPI   + = positive Constitutional:   + weight loss, night sweats, fevers, chills, +fatigue, lassitude. HEENT:    headaches, difficulty swallowing, tooth/dental problems, sore throat,       sneezing, itching, ear ache, +nasal congestion, post nasal drip, snoring CV:    chest pain, orthopnea, PND, swelling in lower extremities, anasarca,                                   dizziness, palpitations Resp:   +shortness of breath with exertion or at rest.                productive cough,   non-productive cough, coughing up of blood.              change in color of mucus.  wheezing.   Skin:    rash or lesions. GI:  No-   heartburn, indigestion, abdominal pain, nausea, vomiting, diarrhea,                 change in bowel habits, loss of appetite GU: dysuria, change in color of urine, no urgency or  frequency.   flank pain. MS:   +joint pain, stiffness, decreased range of motion, back pain. Neuro-     + muscle relaxation R abdominal walal Psych:  change in mood or affect.  +depression or +anxiety.   +memory loss.  OBJ- Physical Exam General- Alert, Oriented, Affect-appropriate, Distress- none acute,  Skin- rash-none, lesions- none, excoriation- none Lymphadenopathy- none Head- atraumatic            Eyes- Gross vision intact, PERRLA, conjunctivae and secretions clear            Ears- +Hearing aid            Nose- Clear, no-Septal dev, mucus, polyps, erosion, perforation             Throat- Mallampati II-III , mucosa clear , drainage- none, tonsils- atrophic, + teeth Neck- flexible , trachea midline, no stridor , thyroid nl, carotid no bruit Chest - symmetrical excursion , unlabored           Heart/CV- RRR/ 112 , no murmur , no gallop  , no rub, nl s1 s2                           - JVD- none ,  edema- none, stasis changes- none, varices- none           Lung- clear to P&A, wheeze- none, cough- none , dullness+ R base, rub- none           Chest wall-  + L pacemaker Abd-  + muscle relaxation R lateral abdominal wall with overlying superficial varix. I can't tell that liver is enlarged,  Br/ Gen/ Rectal- Not done, not indicated Extrem- cyanosis- none, clubbing, none, atrophy- none, strength- nl Neuro- grossly intact to observation

## 2021-05-31 ENCOUNTER — Encounter: Payer: Self-pay | Admitting: Hematology & Oncology

## 2021-05-31 ENCOUNTER — Ambulatory Visit: Payer: Medicare Other | Admitting: Internal Medicine

## 2021-05-31 DIAGNOSIS — C7951 Secondary malignant neoplasm of bone: Secondary | ICD-10-CM | POA: Diagnosis not present

## 2021-05-31 DIAGNOSIS — C7801 Secondary malignant neoplasm of right lung: Secondary | ICD-10-CM | POA: Diagnosis not present

## 2021-05-31 DIAGNOSIS — C37 Malignant neoplasm of thymus: Secondary | ICD-10-CM | POA: Diagnosis not present

## 2021-05-31 DIAGNOSIS — C7802 Secondary malignant neoplasm of left lung: Secondary | ICD-10-CM | POA: Diagnosis not present

## 2021-05-31 DIAGNOSIS — Z8616 Personal history of COVID-19: Secondary | ICD-10-CM | POA: Diagnosis not present

## 2021-05-31 DIAGNOSIS — C787 Secondary malignant neoplasm of liver and intrahepatic bile duct: Secondary | ICD-10-CM | POA: Diagnosis not present

## 2021-06-02 DIAGNOSIS — C7802 Secondary malignant neoplasm of left lung: Secondary | ICD-10-CM | POA: Diagnosis not present

## 2021-06-02 DIAGNOSIS — Z8616 Personal history of COVID-19: Secondary | ICD-10-CM | POA: Diagnosis not present

## 2021-06-02 DIAGNOSIS — C7801 Secondary malignant neoplasm of right lung: Secondary | ICD-10-CM | POA: Diagnosis not present

## 2021-06-02 DIAGNOSIS — C7951 Secondary malignant neoplasm of bone: Secondary | ICD-10-CM | POA: Diagnosis not present

## 2021-06-02 DIAGNOSIS — C37 Malignant neoplasm of thymus: Secondary | ICD-10-CM | POA: Diagnosis not present

## 2021-06-02 DIAGNOSIS — C787 Secondary malignant neoplasm of liver and intrahepatic bile duct: Secondary | ICD-10-CM | POA: Diagnosis not present

## 2021-06-04 DIAGNOSIS — Z8616 Personal history of COVID-19: Secondary | ICD-10-CM | POA: Diagnosis not present

## 2021-06-04 DIAGNOSIS — C7951 Secondary malignant neoplasm of bone: Secondary | ICD-10-CM | POA: Diagnosis not present

## 2021-06-04 DIAGNOSIS — C7801 Secondary malignant neoplasm of right lung: Secondary | ICD-10-CM | POA: Diagnosis not present

## 2021-06-04 DIAGNOSIS — C7802 Secondary malignant neoplasm of left lung: Secondary | ICD-10-CM | POA: Diagnosis not present

## 2021-06-04 DIAGNOSIS — C37 Malignant neoplasm of thymus: Secondary | ICD-10-CM | POA: Diagnosis not present

## 2021-06-04 DIAGNOSIS — C787 Secondary malignant neoplasm of liver and intrahepatic bile duct: Secondary | ICD-10-CM | POA: Diagnosis not present

## 2021-06-06 DIAGNOSIS — C7802 Secondary malignant neoplasm of left lung: Secondary | ICD-10-CM | POA: Diagnosis not present

## 2021-06-06 DIAGNOSIS — Z8616 Personal history of COVID-19: Secondary | ICD-10-CM | POA: Diagnosis not present

## 2021-06-06 DIAGNOSIS — C7951 Secondary malignant neoplasm of bone: Secondary | ICD-10-CM | POA: Diagnosis not present

## 2021-06-06 DIAGNOSIS — C787 Secondary malignant neoplasm of liver and intrahepatic bile duct: Secondary | ICD-10-CM | POA: Diagnosis not present

## 2021-06-06 DIAGNOSIS — C37 Malignant neoplasm of thymus: Secondary | ICD-10-CM | POA: Diagnosis not present

## 2021-06-06 DIAGNOSIS — C7801 Secondary malignant neoplasm of right lung: Secondary | ICD-10-CM | POA: Diagnosis not present

## 2021-06-07 DIAGNOSIS — C7951 Secondary malignant neoplasm of bone: Secondary | ICD-10-CM | POA: Diagnosis not present

## 2021-06-07 DIAGNOSIS — C37 Malignant neoplasm of thymus: Secondary | ICD-10-CM | POA: Diagnosis not present

## 2021-06-07 DIAGNOSIS — Z8616 Personal history of COVID-19: Secondary | ICD-10-CM | POA: Diagnosis not present

## 2021-06-07 DIAGNOSIS — C7801 Secondary malignant neoplasm of right lung: Secondary | ICD-10-CM | POA: Diagnosis not present

## 2021-06-07 DIAGNOSIS — C787 Secondary malignant neoplasm of liver and intrahepatic bile duct: Secondary | ICD-10-CM | POA: Diagnosis not present

## 2021-06-07 DIAGNOSIS — C7802 Secondary malignant neoplasm of left lung: Secondary | ICD-10-CM | POA: Diagnosis not present

## 2021-06-08 DIAGNOSIS — C7951 Secondary malignant neoplasm of bone: Secondary | ICD-10-CM | POA: Diagnosis not present

## 2021-06-08 DIAGNOSIS — F430 Acute stress reaction: Secondary | ICD-10-CM | POA: Diagnosis not present

## 2021-06-08 DIAGNOSIS — C7801 Secondary malignant neoplasm of right lung: Secondary | ICD-10-CM | POA: Diagnosis not present

## 2021-06-08 DIAGNOSIS — F410 Panic disorder [episodic paroxysmal anxiety] without agoraphobia: Secondary | ICD-10-CM | POA: Diagnosis not present

## 2021-06-08 DIAGNOSIS — Z8616 Personal history of COVID-19: Secondary | ICD-10-CM | POA: Diagnosis not present

## 2021-06-08 DIAGNOSIS — C7802 Secondary malignant neoplasm of left lung: Secondary | ICD-10-CM | POA: Diagnosis not present

## 2021-06-08 DIAGNOSIS — C787 Secondary malignant neoplasm of liver and intrahepatic bile duct: Secondary | ICD-10-CM | POA: Diagnosis not present

## 2021-06-08 DIAGNOSIS — F411 Generalized anxiety disorder: Secondary | ICD-10-CM | POA: Diagnosis not present

## 2021-06-08 DIAGNOSIS — C37 Malignant neoplasm of thymus: Secondary | ICD-10-CM | POA: Diagnosis not present

## 2021-06-11 DIAGNOSIS — Z8616 Personal history of COVID-19: Secondary | ICD-10-CM | POA: Diagnosis not present

## 2021-06-11 DIAGNOSIS — C37 Malignant neoplasm of thymus: Secondary | ICD-10-CM | POA: Diagnosis not present

## 2021-06-11 DIAGNOSIS — C7802 Secondary malignant neoplasm of left lung: Secondary | ICD-10-CM | POA: Diagnosis not present

## 2021-06-11 DIAGNOSIS — C787 Secondary malignant neoplasm of liver and intrahepatic bile duct: Secondary | ICD-10-CM | POA: Diagnosis not present

## 2021-06-11 DIAGNOSIS — C7951 Secondary malignant neoplasm of bone: Secondary | ICD-10-CM | POA: Diagnosis not present

## 2021-06-11 DIAGNOSIS — C7801 Secondary malignant neoplasm of right lung: Secondary | ICD-10-CM | POA: Diagnosis not present

## 2021-06-13 DIAGNOSIS — C787 Secondary malignant neoplasm of liver and intrahepatic bile duct: Secondary | ICD-10-CM | POA: Diagnosis not present

## 2021-06-13 DIAGNOSIS — C7802 Secondary malignant neoplasm of left lung: Secondary | ICD-10-CM | POA: Diagnosis not present

## 2021-06-13 DIAGNOSIS — Z8616 Personal history of COVID-19: Secondary | ICD-10-CM | POA: Diagnosis not present

## 2021-06-13 DIAGNOSIS — C7801 Secondary malignant neoplasm of right lung: Secondary | ICD-10-CM | POA: Diagnosis not present

## 2021-06-13 DIAGNOSIS — C37 Malignant neoplasm of thymus: Secondary | ICD-10-CM | POA: Diagnosis not present

## 2021-06-13 DIAGNOSIS — C7951 Secondary malignant neoplasm of bone: Secondary | ICD-10-CM | POA: Diagnosis not present

## 2021-06-15 DIAGNOSIS — C7951 Secondary malignant neoplasm of bone: Secondary | ICD-10-CM | POA: Diagnosis not present

## 2021-06-15 DIAGNOSIS — C7801 Secondary malignant neoplasm of right lung: Secondary | ICD-10-CM | POA: Diagnosis not present

## 2021-06-15 DIAGNOSIS — C7802 Secondary malignant neoplasm of left lung: Secondary | ICD-10-CM | POA: Diagnosis not present

## 2021-06-15 DIAGNOSIS — C37 Malignant neoplasm of thymus: Secondary | ICD-10-CM | POA: Diagnosis not present

## 2021-06-15 DIAGNOSIS — C787 Secondary malignant neoplasm of liver and intrahepatic bile duct: Secondary | ICD-10-CM | POA: Diagnosis not present

## 2021-06-15 DIAGNOSIS — Z8616 Personal history of COVID-19: Secondary | ICD-10-CM | POA: Diagnosis not present

## 2021-06-18 DIAGNOSIS — C7951 Secondary malignant neoplasm of bone: Secondary | ICD-10-CM | POA: Diagnosis not present

## 2021-06-18 DIAGNOSIS — C7802 Secondary malignant neoplasm of left lung: Secondary | ICD-10-CM | POA: Diagnosis not present

## 2021-06-18 DIAGNOSIS — C7801 Secondary malignant neoplasm of right lung: Secondary | ICD-10-CM | POA: Diagnosis not present

## 2021-06-18 DIAGNOSIS — Z8616 Personal history of COVID-19: Secondary | ICD-10-CM | POA: Diagnosis not present

## 2021-06-18 DIAGNOSIS — C37 Malignant neoplasm of thymus: Secondary | ICD-10-CM | POA: Diagnosis not present

## 2021-06-18 DIAGNOSIS — C787 Secondary malignant neoplasm of liver and intrahepatic bile duct: Secondary | ICD-10-CM | POA: Diagnosis not present

## 2021-06-20 DIAGNOSIS — C7802 Secondary malignant neoplasm of left lung: Secondary | ICD-10-CM | POA: Diagnosis not present

## 2021-06-20 DIAGNOSIS — C787 Secondary malignant neoplasm of liver and intrahepatic bile duct: Secondary | ICD-10-CM | POA: Diagnosis not present

## 2021-06-20 DIAGNOSIS — C37 Malignant neoplasm of thymus: Secondary | ICD-10-CM | POA: Diagnosis not present

## 2021-06-20 DIAGNOSIS — Z8616 Personal history of COVID-19: Secondary | ICD-10-CM | POA: Diagnosis not present

## 2021-06-20 DIAGNOSIS — C7801 Secondary malignant neoplasm of right lung: Secondary | ICD-10-CM | POA: Diagnosis not present

## 2021-06-20 DIAGNOSIS — C7951 Secondary malignant neoplasm of bone: Secondary | ICD-10-CM | POA: Diagnosis not present

## 2021-06-21 DIAGNOSIS — Z8616 Personal history of COVID-19: Secondary | ICD-10-CM | POA: Diagnosis not present

## 2021-06-21 DIAGNOSIS — C787 Secondary malignant neoplasm of liver and intrahepatic bile duct: Secondary | ICD-10-CM | POA: Diagnosis not present

## 2021-06-21 DIAGNOSIS — C37 Malignant neoplasm of thymus: Secondary | ICD-10-CM | POA: Diagnosis not present

## 2021-06-21 DIAGNOSIS — C7802 Secondary malignant neoplasm of left lung: Secondary | ICD-10-CM | POA: Diagnosis not present

## 2021-06-21 DIAGNOSIS — C7951 Secondary malignant neoplasm of bone: Secondary | ICD-10-CM | POA: Diagnosis not present

## 2021-06-21 DIAGNOSIS — C7801 Secondary malignant neoplasm of right lung: Secondary | ICD-10-CM | POA: Diagnosis not present

## 2021-06-23 DIAGNOSIS — C7951 Secondary malignant neoplasm of bone: Secondary | ICD-10-CM | POA: Diagnosis not present

## 2021-06-23 DIAGNOSIS — C37 Malignant neoplasm of thymus: Secondary | ICD-10-CM | POA: Diagnosis not present

## 2021-06-23 DIAGNOSIS — C7802 Secondary malignant neoplasm of left lung: Secondary | ICD-10-CM | POA: Diagnosis not present

## 2021-06-23 DIAGNOSIS — Z950 Presence of cardiac pacemaker: Secondary | ICD-10-CM | POA: Diagnosis not present

## 2021-06-23 DIAGNOSIS — Z9981 Dependence on supplemental oxygen: Secondary | ICD-10-CM | POA: Diagnosis not present

## 2021-06-23 DIAGNOSIS — Z9989 Dependence on other enabling machines and devices: Secondary | ICD-10-CM | POA: Diagnosis not present

## 2021-06-23 DIAGNOSIS — Z8616 Personal history of COVID-19: Secondary | ICD-10-CM | POA: Diagnosis not present

## 2021-06-23 DIAGNOSIS — E271 Primary adrenocortical insufficiency: Secondary | ICD-10-CM | POA: Diagnosis not present

## 2021-06-23 DIAGNOSIS — C7801 Secondary malignant neoplasm of right lung: Secondary | ICD-10-CM | POA: Diagnosis not present

## 2021-06-23 DIAGNOSIS — C787 Secondary malignant neoplasm of liver and intrahepatic bile duct: Secondary | ICD-10-CM | POA: Diagnosis not present

## 2021-06-23 DIAGNOSIS — F32A Depression, unspecified: Secondary | ICD-10-CM | POA: Diagnosis not present

## 2021-06-23 DIAGNOSIS — G40909 Epilepsy, unspecified, not intractable, without status epilepticus: Secondary | ICD-10-CM | POA: Diagnosis not present

## 2021-06-23 DIAGNOSIS — D6861 Antiphospholipid syndrome: Secondary | ICD-10-CM | POA: Diagnosis not present

## 2021-06-23 DIAGNOSIS — J309 Allergic rhinitis, unspecified: Secondary | ICD-10-CM | POA: Diagnosis not present

## 2021-06-23 DIAGNOSIS — G0481 Other encephalitis and encephalomyelitis: Secondary | ICD-10-CM | POA: Diagnosis not present

## 2021-06-23 DIAGNOSIS — R634 Abnormal weight loss: Secondary | ICD-10-CM | POA: Diagnosis not present

## 2021-06-23 DIAGNOSIS — G4733 Obstructive sleep apnea (adult) (pediatric): Secondary | ICD-10-CM | POA: Diagnosis not present

## 2021-06-25 DIAGNOSIS — C7951 Secondary malignant neoplasm of bone: Secondary | ICD-10-CM | POA: Diagnosis not present

## 2021-06-25 DIAGNOSIS — C787 Secondary malignant neoplasm of liver and intrahepatic bile duct: Secondary | ICD-10-CM | POA: Diagnosis not present

## 2021-06-25 DIAGNOSIS — C37 Malignant neoplasm of thymus: Secondary | ICD-10-CM | POA: Diagnosis not present

## 2021-06-25 DIAGNOSIS — C7802 Secondary malignant neoplasm of left lung: Secondary | ICD-10-CM | POA: Diagnosis not present

## 2021-06-25 DIAGNOSIS — C7801 Secondary malignant neoplasm of right lung: Secondary | ICD-10-CM | POA: Diagnosis not present

## 2021-06-25 DIAGNOSIS — Z8616 Personal history of COVID-19: Secondary | ICD-10-CM | POA: Diagnosis not present

## 2021-06-28 DIAGNOSIS — C787 Secondary malignant neoplasm of liver and intrahepatic bile duct: Secondary | ICD-10-CM | POA: Diagnosis not present

## 2021-06-28 DIAGNOSIS — C7802 Secondary malignant neoplasm of left lung: Secondary | ICD-10-CM | POA: Diagnosis not present

## 2021-06-28 DIAGNOSIS — Z8616 Personal history of COVID-19: Secondary | ICD-10-CM | POA: Diagnosis not present

## 2021-06-28 DIAGNOSIS — C37 Malignant neoplasm of thymus: Secondary | ICD-10-CM | POA: Diagnosis not present

## 2021-06-28 DIAGNOSIS — C7951 Secondary malignant neoplasm of bone: Secondary | ICD-10-CM | POA: Diagnosis not present

## 2021-06-28 DIAGNOSIS — C7801 Secondary malignant neoplasm of right lung: Secondary | ICD-10-CM | POA: Diagnosis not present

## 2021-07-02 DIAGNOSIS — C37 Malignant neoplasm of thymus: Secondary | ICD-10-CM | POA: Diagnosis not present

## 2021-07-02 DIAGNOSIS — C7801 Secondary malignant neoplasm of right lung: Secondary | ICD-10-CM | POA: Diagnosis not present

## 2021-07-02 DIAGNOSIS — C7951 Secondary malignant neoplasm of bone: Secondary | ICD-10-CM | POA: Diagnosis not present

## 2021-07-02 DIAGNOSIS — C7802 Secondary malignant neoplasm of left lung: Secondary | ICD-10-CM | POA: Diagnosis not present

## 2021-07-02 DIAGNOSIS — Z7901 Long term (current) use of anticoagulants: Secondary | ICD-10-CM | POA: Diagnosis not present

## 2021-07-02 DIAGNOSIS — Z8616 Personal history of COVID-19: Secondary | ICD-10-CM | POA: Diagnosis not present

## 2021-07-02 DIAGNOSIS — C787 Secondary malignant neoplasm of liver and intrahepatic bile duct: Secondary | ICD-10-CM | POA: Diagnosis not present

## 2021-07-06 DIAGNOSIS — Z8616 Personal history of COVID-19: Secondary | ICD-10-CM | POA: Diagnosis not present

## 2021-07-06 DIAGNOSIS — C7801 Secondary malignant neoplasm of right lung: Secondary | ICD-10-CM | POA: Diagnosis not present

## 2021-07-06 DIAGNOSIS — C7802 Secondary malignant neoplasm of left lung: Secondary | ICD-10-CM | POA: Diagnosis not present

## 2021-07-06 DIAGNOSIS — C37 Malignant neoplasm of thymus: Secondary | ICD-10-CM | POA: Diagnosis not present

## 2021-07-06 DIAGNOSIS — C787 Secondary malignant neoplasm of liver and intrahepatic bile duct: Secondary | ICD-10-CM | POA: Diagnosis not present

## 2021-07-06 DIAGNOSIS — C7951 Secondary malignant neoplasm of bone: Secondary | ICD-10-CM | POA: Diagnosis not present

## 2021-07-09 DIAGNOSIS — Z8616 Personal history of COVID-19: Secondary | ICD-10-CM | POA: Diagnosis not present

## 2021-07-09 DIAGNOSIS — C787 Secondary malignant neoplasm of liver and intrahepatic bile duct: Secondary | ICD-10-CM | POA: Diagnosis not present

## 2021-07-09 DIAGNOSIS — C37 Malignant neoplasm of thymus: Secondary | ICD-10-CM | POA: Diagnosis not present

## 2021-07-09 DIAGNOSIS — C7951 Secondary malignant neoplasm of bone: Secondary | ICD-10-CM | POA: Diagnosis not present

## 2021-07-09 DIAGNOSIS — C7802 Secondary malignant neoplasm of left lung: Secondary | ICD-10-CM | POA: Diagnosis not present

## 2021-07-09 DIAGNOSIS — C7801 Secondary malignant neoplasm of right lung: Secondary | ICD-10-CM | POA: Diagnosis not present

## 2021-07-10 DIAGNOSIS — C7802 Secondary malignant neoplasm of left lung: Secondary | ICD-10-CM | POA: Diagnosis not present

## 2021-07-10 DIAGNOSIS — C787 Secondary malignant neoplasm of liver and intrahepatic bile duct: Secondary | ICD-10-CM | POA: Diagnosis not present

## 2021-07-10 DIAGNOSIS — C7801 Secondary malignant neoplasm of right lung: Secondary | ICD-10-CM | POA: Diagnosis not present

## 2021-07-10 DIAGNOSIS — Z8616 Personal history of COVID-19: Secondary | ICD-10-CM | POA: Diagnosis not present

## 2021-07-10 DIAGNOSIS — C7951 Secondary malignant neoplasm of bone: Secondary | ICD-10-CM | POA: Diagnosis not present

## 2021-07-10 DIAGNOSIS — C37 Malignant neoplasm of thymus: Secondary | ICD-10-CM | POA: Diagnosis not present

## 2021-07-11 DIAGNOSIS — C7802 Secondary malignant neoplasm of left lung: Secondary | ICD-10-CM | POA: Diagnosis not present

## 2021-07-11 DIAGNOSIS — Z8616 Personal history of COVID-19: Secondary | ICD-10-CM | POA: Diagnosis not present

## 2021-07-11 DIAGNOSIS — C37 Malignant neoplasm of thymus: Secondary | ICD-10-CM | POA: Diagnosis not present

## 2021-07-11 DIAGNOSIS — C7801 Secondary malignant neoplasm of right lung: Secondary | ICD-10-CM | POA: Diagnosis not present

## 2021-07-11 DIAGNOSIS — C7951 Secondary malignant neoplasm of bone: Secondary | ICD-10-CM | POA: Diagnosis not present

## 2021-07-11 DIAGNOSIS — C787 Secondary malignant neoplasm of liver and intrahepatic bile duct: Secondary | ICD-10-CM | POA: Diagnosis not present

## 2021-07-12 DIAGNOSIS — C7802 Secondary malignant neoplasm of left lung: Secondary | ICD-10-CM | POA: Diagnosis not present

## 2021-07-12 DIAGNOSIS — C7951 Secondary malignant neoplasm of bone: Secondary | ICD-10-CM | POA: Diagnosis not present

## 2021-07-12 DIAGNOSIS — C787 Secondary malignant neoplasm of liver and intrahepatic bile duct: Secondary | ICD-10-CM | POA: Diagnosis not present

## 2021-07-12 DIAGNOSIS — C7801 Secondary malignant neoplasm of right lung: Secondary | ICD-10-CM | POA: Diagnosis not present

## 2021-07-12 DIAGNOSIS — Z8616 Personal history of COVID-19: Secondary | ICD-10-CM | POA: Diagnosis not present

## 2021-07-12 DIAGNOSIS — C37 Malignant neoplasm of thymus: Secondary | ICD-10-CM | POA: Diagnosis not present

## 2021-07-13 DIAGNOSIS — C7802 Secondary malignant neoplasm of left lung: Secondary | ICD-10-CM | POA: Diagnosis not present

## 2021-07-13 DIAGNOSIS — C7801 Secondary malignant neoplasm of right lung: Secondary | ICD-10-CM | POA: Diagnosis not present

## 2021-07-13 DIAGNOSIS — C7951 Secondary malignant neoplasm of bone: Secondary | ICD-10-CM | POA: Diagnosis not present

## 2021-07-13 DIAGNOSIS — Z8616 Personal history of COVID-19: Secondary | ICD-10-CM | POA: Diagnosis not present

## 2021-07-13 DIAGNOSIS — C787 Secondary malignant neoplasm of liver and intrahepatic bile duct: Secondary | ICD-10-CM | POA: Diagnosis not present

## 2021-07-13 DIAGNOSIS — C37 Malignant neoplasm of thymus: Secondary | ICD-10-CM | POA: Diagnosis not present

## 2021-07-14 DIAGNOSIS — C7801 Secondary malignant neoplasm of right lung: Secondary | ICD-10-CM | POA: Diagnosis not present

## 2021-07-14 DIAGNOSIS — Z8616 Personal history of COVID-19: Secondary | ICD-10-CM | POA: Diagnosis not present

## 2021-07-14 DIAGNOSIS — C7802 Secondary malignant neoplasm of left lung: Secondary | ICD-10-CM | POA: Diagnosis not present

## 2021-07-14 DIAGNOSIS — C37 Malignant neoplasm of thymus: Secondary | ICD-10-CM | POA: Diagnosis not present

## 2021-07-14 DIAGNOSIS — C787 Secondary malignant neoplasm of liver and intrahepatic bile duct: Secondary | ICD-10-CM | POA: Diagnosis not present

## 2021-07-14 DIAGNOSIS — C7951 Secondary malignant neoplasm of bone: Secondary | ICD-10-CM | POA: Diagnosis not present

## 2021-07-15 DIAGNOSIS — C787 Secondary malignant neoplasm of liver and intrahepatic bile duct: Secondary | ICD-10-CM | POA: Diagnosis not present

## 2021-07-15 DIAGNOSIS — C7801 Secondary malignant neoplasm of right lung: Secondary | ICD-10-CM | POA: Diagnosis not present

## 2021-07-15 DIAGNOSIS — C37 Malignant neoplasm of thymus: Secondary | ICD-10-CM | POA: Diagnosis not present

## 2021-07-15 DIAGNOSIS — Z8616 Personal history of COVID-19: Secondary | ICD-10-CM | POA: Diagnosis not present

## 2021-07-15 DIAGNOSIS — C7802 Secondary malignant neoplasm of left lung: Secondary | ICD-10-CM | POA: Diagnosis not present

## 2021-07-15 DIAGNOSIS — C7951 Secondary malignant neoplasm of bone: Secondary | ICD-10-CM | POA: Diagnosis not present

## 2021-07-16 DIAGNOSIS — C7801 Secondary malignant neoplasm of right lung: Secondary | ICD-10-CM | POA: Diagnosis not present

## 2021-07-16 DIAGNOSIS — C7951 Secondary malignant neoplasm of bone: Secondary | ICD-10-CM | POA: Diagnosis not present

## 2021-07-16 DIAGNOSIS — C7802 Secondary malignant neoplasm of left lung: Secondary | ICD-10-CM | POA: Diagnosis not present

## 2021-07-16 DIAGNOSIS — Z8616 Personal history of COVID-19: Secondary | ICD-10-CM | POA: Diagnosis not present

## 2021-07-16 DIAGNOSIS — C787 Secondary malignant neoplasm of liver and intrahepatic bile duct: Secondary | ICD-10-CM | POA: Diagnosis not present

## 2021-07-16 DIAGNOSIS — C37 Malignant neoplasm of thymus: Secondary | ICD-10-CM | POA: Diagnosis not present

## 2021-07-18 DIAGNOSIS — C7951 Secondary malignant neoplasm of bone: Secondary | ICD-10-CM | POA: Diagnosis not present

## 2021-07-18 DIAGNOSIS — C7802 Secondary malignant neoplasm of left lung: Secondary | ICD-10-CM | POA: Diagnosis not present

## 2021-07-18 DIAGNOSIS — C37 Malignant neoplasm of thymus: Secondary | ICD-10-CM | POA: Diagnosis not present

## 2021-07-18 DIAGNOSIS — C787 Secondary malignant neoplasm of liver and intrahepatic bile duct: Secondary | ICD-10-CM | POA: Diagnosis not present

## 2021-07-18 DIAGNOSIS — Z8616 Personal history of COVID-19: Secondary | ICD-10-CM | POA: Diagnosis not present

## 2021-07-18 DIAGNOSIS — C7801 Secondary malignant neoplasm of right lung: Secondary | ICD-10-CM | POA: Diagnosis not present

## 2021-07-19 DIAGNOSIS — Z8616 Personal history of COVID-19: Secondary | ICD-10-CM | POA: Diagnosis not present

## 2021-07-19 DIAGNOSIS — C787 Secondary malignant neoplasm of liver and intrahepatic bile duct: Secondary | ICD-10-CM | POA: Diagnosis not present

## 2021-07-19 DIAGNOSIS — Z45018 Encounter for adjustment and management of other part of cardiac pacemaker: Secondary | ICD-10-CM | POA: Diagnosis not present

## 2021-07-19 DIAGNOSIS — C7951 Secondary malignant neoplasm of bone: Secondary | ICD-10-CM | POA: Diagnosis not present

## 2021-07-19 DIAGNOSIS — C37 Malignant neoplasm of thymus: Secondary | ICD-10-CM | POA: Diagnosis not present

## 2021-07-19 DIAGNOSIS — C7802 Secondary malignant neoplasm of left lung: Secondary | ICD-10-CM | POA: Diagnosis not present

## 2021-07-19 DIAGNOSIS — C7801 Secondary malignant neoplasm of right lung: Secondary | ICD-10-CM | POA: Diagnosis not present

## 2021-07-20 DIAGNOSIS — C7802 Secondary malignant neoplasm of left lung: Secondary | ICD-10-CM | POA: Diagnosis not present

## 2021-07-20 DIAGNOSIS — Z8616 Personal history of COVID-19: Secondary | ICD-10-CM | POA: Diagnosis not present

## 2021-07-20 DIAGNOSIS — C7951 Secondary malignant neoplasm of bone: Secondary | ICD-10-CM | POA: Diagnosis not present

## 2021-07-20 DIAGNOSIS — C787 Secondary malignant neoplasm of liver and intrahepatic bile duct: Secondary | ICD-10-CM | POA: Diagnosis not present

## 2021-07-20 DIAGNOSIS — C7801 Secondary malignant neoplasm of right lung: Secondary | ICD-10-CM | POA: Diagnosis not present

## 2021-07-20 DIAGNOSIS — C37 Malignant neoplasm of thymus: Secondary | ICD-10-CM | POA: Diagnosis not present

## 2021-07-23 DIAGNOSIS — C7802 Secondary malignant neoplasm of left lung: Secondary | ICD-10-CM | POA: Diagnosis not present

## 2021-07-23 DIAGNOSIS — C787 Secondary malignant neoplasm of liver and intrahepatic bile duct: Secondary | ICD-10-CM | POA: Diagnosis not present

## 2021-07-23 DIAGNOSIS — C7801 Secondary malignant neoplasm of right lung: Secondary | ICD-10-CM | POA: Diagnosis not present

## 2021-07-23 DIAGNOSIS — C37 Malignant neoplasm of thymus: Secondary | ICD-10-CM | POA: Diagnosis not present

## 2021-07-23 DIAGNOSIS — Z8616 Personal history of COVID-19: Secondary | ICD-10-CM | POA: Diagnosis not present

## 2021-07-23 DIAGNOSIS — C7951 Secondary malignant neoplasm of bone: Secondary | ICD-10-CM | POA: Diagnosis not present

## 2021-07-24 DIAGNOSIS — C37 Malignant neoplasm of thymus: Secondary | ICD-10-CM | POA: Diagnosis not present

## 2021-07-24 DIAGNOSIS — Z8616 Personal history of COVID-19: Secondary | ICD-10-CM | POA: Diagnosis not present

## 2021-07-24 DIAGNOSIS — R634 Abnormal weight loss: Secondary | ICD-10-CM | POA: Diagnosis not present

## 2021-07-24 DIAGNOSIS — C7951 Secondary malignant neoplasm of bone: Secondary | ICD-10-CM | POA: Diagnosis not present

## 2021-07-24 DIAGNOSIS — F32A Depression, unspecified: Secondary | ICD-10-CM | POA: Diagnosis not present

## 2021-07-24 DIAGNOSIS — Z9981 Dependence on supplemental oxygen: Secondary | ICD-10-CM | POA: Diagnosis not present

## 2021-07-24 DIAGNOSIS — E271 Primary adrenocortical insufficiency: Secondary | ICD-10-CM | POA: Diagnosis not present

## 2021-07-24 DIAGNOSIS — L511 Stevens-Johnson syndrome: Secondary | ICD-10-CM | POA: Diagnosis not present

## 2021-07-24 DIAGNOSIS — J309 Allergic rhinitis, unspecified: Secondary | ICD-10-CM | POA: Diagnosis not present

## 2021-07-24 DIAGNOSIS — C7801 Secondary malignant neoplasm of right lung: Secondary | ICD-10-CM | POA: Diagnosis not present

## 2021-07-24 DIAGNOSIS — G4733 Obstructive sleep apnea (adult) (pediatric): Secondary | ICD-10-CM | POA: Diagnosis not present

## 2021-07-24 DIAGNOSIS — C787 Secondary malignant neoplasm of liver and intrahepatic bile duct: Secondary | ICD-10-CM | POA: Diagnosis not present

## 2021-07-24 DIAGNOSIS — D6861 Antiphospholipid syndrome: Secondary | ICD-10-CM | POA: Diagnosis not present

## 2021-07-24 DIAGNOSIS — C7802 Secondary malignant neoplasm of left lung: Secondary | ICD-10-CM | POA: Diagnosis not present

## 2021-07-24 DIAGNOSIS — Z950 Presence of cardiac pacemaker: Secondary | ICD-10-CM | POA: Diagnosis not present

## 2021-07-24 DIAGNOSIS — G0481 Other encephalitis and encephalomyelitis: Secondary | ICD-10-CM | POA: Diagnosis not present

## 2021-07-24 DIAGNOSIS — Z9989 Dependence on other enabling machines and devices: Secondary | ICD-10-CM | POA: Diagnosis not present

## 2021-07-24 DIAGNOSIS — G40909 Epilepsy, unspecified, not intractable, without status epilepticus: Secondary | ICD-10-CM | POA: Diagnosis not present

## 2021-07-25 DIAGNOSIS — Z23 Encounter for immunization: Secondary | ICD-10-CM | POA: Diagnosis not present

## 2021-07-30 DIAGNOSIS — C7951 Secondary malignant neoplasm of bone: Secondary | ICD-10-CM | POA: Diagnosis not present

## 2021-07-30 DIAGNOSIS — C7802 Secondary malignant neoplasm of left lung: Secondary | ICD-10-CM | POA: Diagnosis not present

## 2021-07-30 DIAGNOSIS — C7801 Secondary malignant neoplasm of right lung: Secondary | ICD-10-CM | POA: Diagnosis not present

## 2021-07-30 DIAGNOSIS — Z8616 Personal history of COVID-19: Secondary | ICD-10-CM | POA: Diagnosis not present

## 2021-07-30 DIAGNOSIS — C37 Malignant neoplasm of thymus: Secondary | ICD-10-CM | POA: Diagnosis not present

## 2021-07-30 DIAGNOSIS — C787 Secondary malignant neoplasm of liver and intrahepatic bile duct: Secondary | ICD-10-CM | POA: Diagnosis not present

## 2021-08-01 ENCOUNTER — Other Ambulatory Visit: Payer: Self-pay | Admitting: Family Medicine

## 2021-08-01 DIAGNOSIS — C37 Malignant neoplasm of thymus: Secondary | ICD-10-CM | POA: Diagnosis not present

## 2021-08-01 DIAGNOSIS — C7802 Secondary malignant neoplasm of left lung: Secondary | ICD-10-CM | POA: Diagnosis not present

## 2021-08-01 DIAGNOSIS — C7951 Secondary malignant neoplasm of bone: Secondary | ICD-10-CM | POA: Diagnosis not present

## 2021-08-01 DIAGNOSIS — C7801 Secondary malignant neoplasm of right lung: Secondary | ICD-10-CM | POA: Diagnosis not present

## 2021-08-01 DIAGNOSIS — Z8616 Personal history of COVID-19: Secondary | ICD-10-CM | POA: Diagnosis not present

## 2021-08-01 DIAGNOSIS — C787 Secondary malignant neoplasm of liver and intrahepatic bile duct: Secondary | ICD-10-CM | POA: Diagnosis not present

## 2021-08-02 DIAGNOSIS — C787 Secondary malignant neoplasm of liver and intrahepatic bile duct: Secondary | ICD-10-CM | POA: Diagnosis not present

## 2021-08-02 DIAGNOSIS — C7801 Secondary malignant neoplasm of right lung: Secondary | ICD-10-CM | POA: Diagnosis not present

## 2021-08-02 DIAGNOSIS — C7802 Secondary malignant neoplasm of left lung: Secondary | ICD-10-CM | POA: Diagnosis not present

## 2021-08-02 DIAGNOSIS — C37 Malignant neoplasm of thymus: Secondary | ICD-10-CM | POA: Diagnosis not present

## 2021-08-02 DIAGNOSIS — C7951 Secondary malignant neoplasm of bone: Secondary | ICD-10-CM | POA: Diagnosis not present

## 2021-08-02 DIAGNOSIS — Z8616 Personal history of COVID-19: Secondary | ICD-10-CM | POA: Diagnosis not present

## 2021-08-06 DIAGNOSIS — Z8616 Personal history of COVID-19: Secondary | ICD-10-CM | POA: Diagnosis not present

## 2021-08-06 DIAGNOSIS — C7801 Secondary malignant neoplasm of right lung: Secondary | ICD-10-CM | POA: Diagnosis not present

## 2021-08-06 DIAGNOSIS — C37 Malignant neoplasm of thymus: Secondary | ICD-10-CM | POA: Diagnosis not present

## 2021-08-06 DIAGNOSIS — C7951 Secondary malignant neoplasm of bone: Secondary | ICD-10-CM | POA: Diagnosis not present

## 2021-08-06 DIAGNOSIS — C787 Secondary malignant neoplasm of liver and intrahepatic bile duct: Secondary | ICD-10-CM | POA: Diagnosis not present

## 2021-08-06 DIAGNOSIS — C7802 Secondary malignant neoplasm of left lung: Secondary | ICD-10-CM | POA: Diagnosis not present

## 2021-08-12 ENCOUNTER — Other Ambulatory Visit: Payer: Self-pay | Admitting: Family Medicine

## 2021-08-13 DIAGNOSIS — Z8616 Personal history of COVID-19: Secondary | ICD-10-CM | POA: Diagnosis not present

## 2021-08-13 DIAGNOSIS — C37 Malignant neoplasm of thymus: Secondary | ICD-10-CM | POA: Diagnosis not present

## 2021-08-13 DIAGNOSIS — C7801 Secondary malignant neoplasm of right lung: Secondary | ICD-10-CM | POA: Diagnosis not present

## 2021-08-13 DIAGNOSIS — C7802 Secondary malignant neoplasm of left lung: Secondary | ICD-10-CM | POA: Diagnosis not present

## 2021-08-13 DIAGNOSIS — C7951 Secondary malignant neoplasm of bone: Secondary | ICD-10-CM | POA: Diagnosis not present

## 2021-08-13 DIAGNOSIS — C787 Secondary malignant neoplasm of liver and intrahepatic bile duct: Secondary | ICD-10-CM | POA: Diagnosis not present

## 2021-08-14 DIAGNOSIS — C787 Secondary malignant neoplasm of liver and intrahepatic bile duct: Secondary | ICD-10-CM | POA: Diagnosis not present

## 2021-08-14 DIAGNOSIS — C37 Malignant neoplasm of thymus: Secondary | ICD-10-CM | POA: Diagnosis not present

## 2021-08-14 DIAGNOSIS — Z8616 Personal history of COVID-19: Secondary | ICD-10-CM | POA: Diagnosis not present

## 2021-08-14 DIAGNOSIS — Z7901 Long term (current) use of anticoagulants: Secondary | ICD-10-CM | POA: Diagnosis not present

## 2021-08-14 DIAGNOSIS — C7802 Secondary malignant neoplasm of left lung: Secondary | ICD-10-CM | POA: Diagnosis not present

## 2021-08-14 DIAGNOSIS — C7801 Secondary malignant neoplasm of right lung: Secondary | ICD-10-CM | POA: Diagnosis not present

## 2021-08-14 DIAGNOSIS — C7951 Secondary malignant neoplasm of bone: Secondary | ICD-10-CM | POA: Diagnosis not present

## 2021-08-17 DIAGNOSIS — C7802 Secondary malignant neoplasm of left lung: Secondary | ICD-10-CM | POA: Diagnosis not present

## 2021-08-17 DIAGNOSIS — C37 Malignant neoplasm of thymus: Secondary | ICD-10-CM | POA: Diagnosis not present

## 2021-08-17 DIAGNOSIS — C787 Secondary malignant neoplasm of liver and intrahepatic bile duct: Secondary | ICD-10-CM | POA: Diagnosis not present

## 2021-08-17 DIAGNOSIS — C7951 Secondary malignant neoplasm of bone: Secondary | ICD-10-CM | POA: Diagnosis not present

## 2021-08-17 DIAGNOSIS — C7801 Secondary malignant neoplasm of right lung: Secondary | ICD-10-CM | POA: Diagnosis not present

## 2021-08-17 DIAGNOSIS — Z8616 Personal history of COVID-19: Secondary | ICD-10-CM | POA: Diagnosis not present

## 2021-08-19 DIAGNOSIS — R7989 Other specified abnormal findings of blood chemistry: Secondary | ICD-10-CM | POA: Diagnosis not present

## 2021-08-19 DIAGNOSIS — C37 Malignant neoplasm of thymus: Secondary | ICD-10-CM | POA: Diagnosis not present

## 2021-08-19 DIAGNOSIS — Z8616 Personal history of COVID-19: Secondary | ICD-10-CM | POA: Diagnosis not present

## 2021-08-19 DIAGNOSIS — C787 Secondary malignant neoplasm of liver and intrahepatic bile duct: Secondary | ICD-10-CM | POA: Diagnosis not present

## 2021-08-19 DIAGNOSIS — C7951 Secondary malignant neoplasm of bone: Secondary | ICD-10-CM | POA: Diagnosis not present

## 2021-08-19 DIAGNOSIS — L512 Toxic epidermal necrolysis [Lyell]: Secondary | ICD-10-CM | POA: Diagnosis not present

## 2021-08-19 DIAGNOSIS — C7801 Secondary malignant neoplasm of right lung: Secondary | ICD-10-CM | POA: Diagnosis not present

## 2021-08-19 DIAGNOSIS — C7802 Secondary malignant neoplasm of left lung: Secondary | ICD-10-CM | POA: Diagnosis not present

## 2021-08-20 DIAGNOSIS — C7801 Secondary malignant neoplasm of right lung: Secondary | ICD-10-CM | POA: Diagnosis not present

## 2021-08-20 DIAGNOSIS — C787 Secondary malignant neoplasm of liver and intrahepatic bile duct: Secondary | ICD-10-CM | POA: Diagnosis not present

## 2021-08-20 DIAGNOSIS — C37 Malignant neoplasm of thymus: Secondary | ICD-10-CM | POA: Diagnosis not present

## 2021-08-20 DIAGNOSIS — C7802 Secondary malignant neoplasm of left lung: Secondary | ICD-10-CM | POA: Diagnosis not present

## 2021-08-20 DIAGNOSIS — Z8616 Personal history of COVID-19: Secondary | ICD-10-CM | POA: Diagnosis not present

## 2021-08-20 DIAGNOSIS — C7951 Secondary malignant neoplasm of bone: Secondary | ICD-10-CM | POA: Diagnosis not present

## 2021-08-21 DIAGNOSIS — C7802 Secondary malignant neoplasm of left lung: Secondary | ICD-10-CM | POA: Diagnosis not present

## 2021-08-21 DIAGNOSIS — C37 Malignant neoplasm of thymus: Secondary | ICD-10-CM | POA: Diagnosis not present

## 2021-08-21 DIAGNOSIS — C7801 Secondary malignant neoplasm of right lung: Secondary | ICD-10-CM | POA: Diagnosis not present

## 2021-08-21 DIAGNOSIS — C7951 Secondary malignant neoplasm of bone: Secondary | ICD-10-CM | POA: Diagnosis not present

## 2021-08-21 DIAGNOSIS — C787 Secondary malignant neoplasm of liver and intrahepatic bile duct: Secondary | ICD-10-CM | POA: Diagnosis not present

## 2021-08-21 DIAGNOSIS — Z8616 Personal history of COVID-19: Secondary | ICD-10-CM | POA: Diagnosis not present

## 2021-08-22 DIAGNOSIS — C7951 Secondary malignant neoplasm of bone: Secondary | ICD-10-CM | POA: Diagnosis not present

## 2021-08-22 DIAGNOSIS — C7802 Secondary malignant neoplasm of left lung: Secondary | ICD-10-CM | POA: Diagnosis not present

## 2021-08-22 DIAGNOSIS — C787 Secondary malignant neoplasm of liver and intrahepatic bile duct: Secondary | ICD-10-CM | POA: Diagnosis not present

## 2021-08-22 DIAGNOSIS — C37 Malignant neoplasm of thymus: Secondary | ICD-10-CM | POA: Diagnosis not present

## 2021-08-22 DIAGNOSIS — C7801 Secondary malignant neoplasm of right lung: Secondary | ICD-10-CM | POA: Diagnosis not present

## 2021-08-22 DIAGNOSIS — Z8616 Personal history of COVID-19: Secondary | ICD-10-CM | POA: Diagnosis not present

## 2021-08-23 DIAGNOSIS — F32A Depression, unspecified: Secondary | ICD-10-CM | POA: Diagnosis not present

## 2021-08-23 DIAGNOSIS — R634 Abnormal weight loss: Secondary | ICD-10-CM | POA: Diagnosis not present

## 2021-08-23 DIAGNOSIS — L511 Stevens-Johnson syndrome: Secondary | ICD-10-CM | POA: Diagnosis not present

## 2021-08-23 DIAGNOSIS — Z9989 Dependence on other enabling machines and devices: Secondary | ICD-10-CM | POA: Diagnosis not present

## 2021-08-23 DIAGNOSIS — G4733 Obstructive sleep apnea (adult) (pediatric): Secondary | ICD-10-CM | POA: Diagnosis not present

## 2021-08-23 DIAGNOSIS — Z9981 Dependence on supplemental oxygen: Secondary | ICD-10-CM | POA: Diagnosis not present

## 2021-08-23 DIAGNOSIS — G40909 Epilepsy, unspecified, not intractable, without status epilepticus: Secondary | ICD-10-CM | POA: Diagnosis not present

## 2021-08-23 DIAGNOSIS — Z950 Presence of cardiac pacemaker: Secondary | ICD-10-CM | POA: Diagnosis not present

## 2021-08-23 DIAGNOSIS — C787 Secondary malignant neoplasm of liver and intrahepatic bile duct: Secondary | ICD-10-CM | POA: Diagnosis not present

## 2021-08-23 DIAGNOSIS — D6861 Antiphospholipid syndrome: Secondary | ICD-10-CM | POA: Diagnosis not present

## 2021-08-23 DIAGNOSIS — C7802 Secondary malignant neoplasm of left lung: Secondary | ICD-10-CM | POA: Diagnosis not present

## 2021-08-23 DIAGNOSIS — Z8616 Personal history of COVID-19: Secondary | ICD-10-CM | POA: Diagnosis not present

## 2021-08-23 DIAGNOSIS — C7801 Secondary malignant neoplasm of right lung: Secondary | ICD-10-CM | POA: Diagnosis not present

## 2021-08-23 DIAGNOSIS — C7951 Secondary malignant neoplasm of bone: Secondary | ICD-10-CM | POA: Diagnosis not present

## 2021-08-23 DIAGNOSIS — E271 Primary adrenocortical insufficiency: Secondary | ICD-10-CM | POA: Diagnosis not present

## 2021-08-23 DIAGNOSIS — C37 Malignant neoplasm of thymus: Secondary | ICD-10-CM | POA: Diagnosis not present

## 2021-08-23 DIAGNOSIS — G0481 Other encephalitis and encephalomyelitis: Secondary | ICD-10-CM | POA: Diagnosis not present

## 2021-08-23 DIAGNOSIS — J309 Allergic rhinitis, unspecified: Secondary | ICD-10-CM | POA: Diagnosis not present

## 2021-08-24 ENCOUNTER — Telehealth: Payer: Self-pay | Admitting: *Deleted

## 2021-08-24 DIAGNOSIS — C7802 Secondary malignant neoplasm of left lung: Secondary | ICD-10-CM | POA: Diagnosis not present

## 2021-08-24 DIAGNOSIS — Z8616 Personal history of COVID-19: Secondary | ICD-10-CM | POA: Diagnosis not present

## 2021-08-24 DIAGNOSIS — C7951 Secondary malignant neoplasm of bone: Secondary | ICD-10-CM | POA: Diagnosis not present

## 2021-08-24 DIAGNOSIS — C7801 Secondary malignant neoplasm of right lung: Secondary | ICD-10-CM | POA: Diagnosis not present

## 2021-08-24 DIAGNOSIS — C37 Malignant neoplasm of thymus: Secondary | ICD-10-CM | POA: Diagnosis not present

## 2021-08-24 DIAGNOSIS — C787 Secondary malignant neoplasm of liver and intrahepatic bile duct: Secondary | ICD-10-CM | POA: Diagnosis not present

## 2021-08-24 NOTE — Telephone Encounter (Signed)
Call received from [facility] at [facility] to notify Dr. Marin Olp that pt passed away this morning, 2021-09-07.  Dr. Marin Olp notified.

## 2021-09-23 DEATH — deceased

## 2022-05-21 IMAGING — CT CT ANGIO CHEST
3 of 9 series · 17 of 36 positions shown · IV contrast (omnipaque)
Comparison: 08/18/20

CLINICAL DATA: History of thymic cancer now with shortness of
breath.

EXAM:
CT ANGIOGRAPHY CHEST WITH CONTRAST
TECHNIQUE: Multidetector CT imaging of the chest was performed using the
standard protocol during bolus administration of intravenous
contrast. Multiplanar CT image reconstructions and MIPs were
obtained to evaluate the vascular anatomy.
CONTRAST:  78mL OMNIPAQUE IOHEXOL 350 MG/ML SOLN

[Series 5: pe thins · axial · 0.96mm/px · z∈[-321,-60]mm · 14 of 303 slices shown]
[im 21/303  lung]
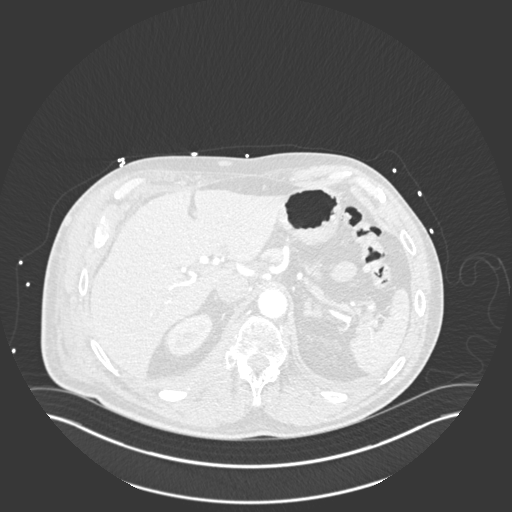
[im 41/303  mediastinal]
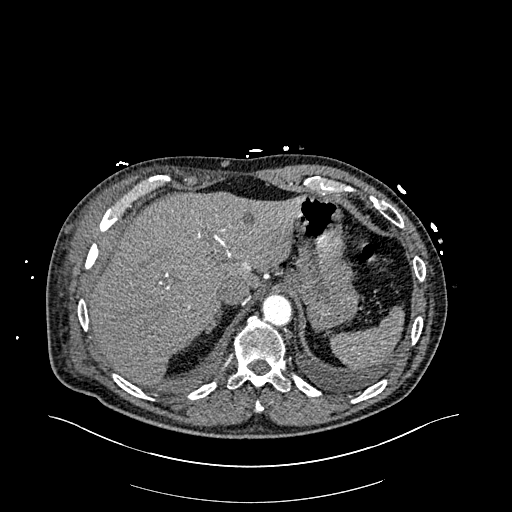
[im 61/303  lung]
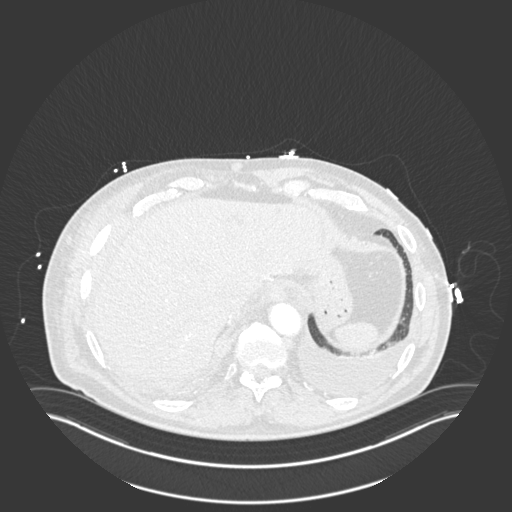
[im 81/303  mediastinal]
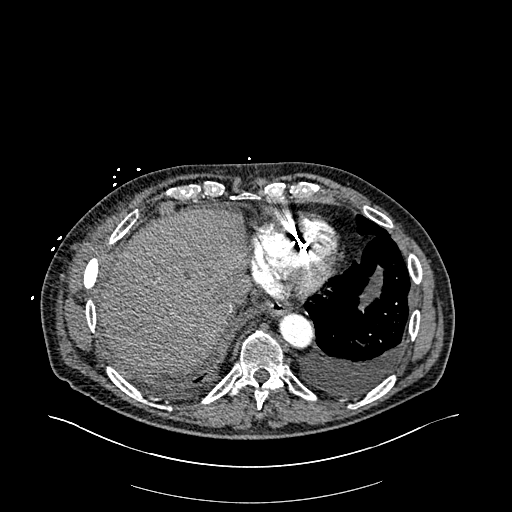
[im 101/303  lung]
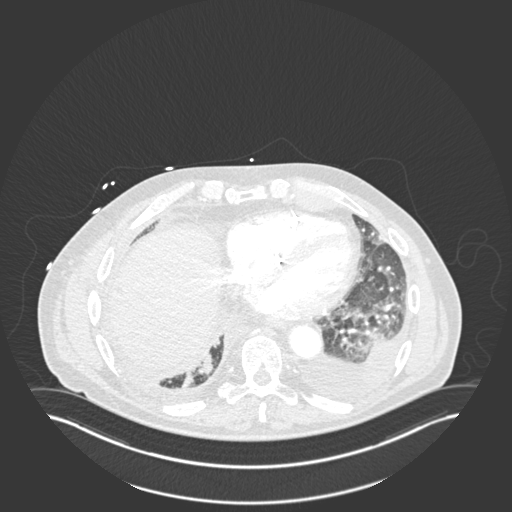
[im 121/303  mediastinal]
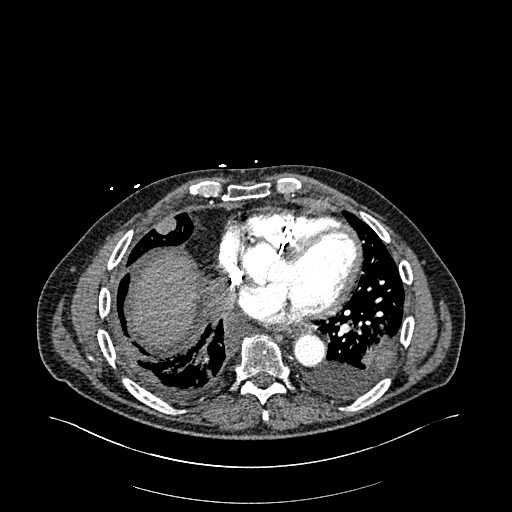
[im 141/303  lung]
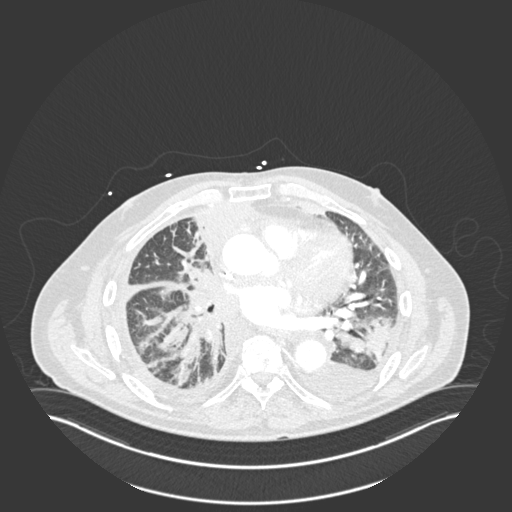
[im 162/303  mediastinal]
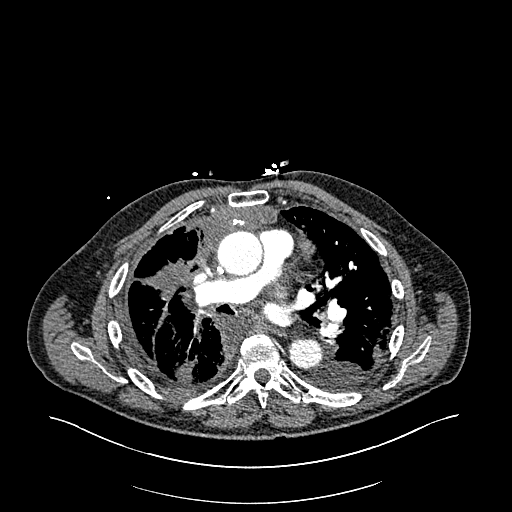
[im 182/303  lung]
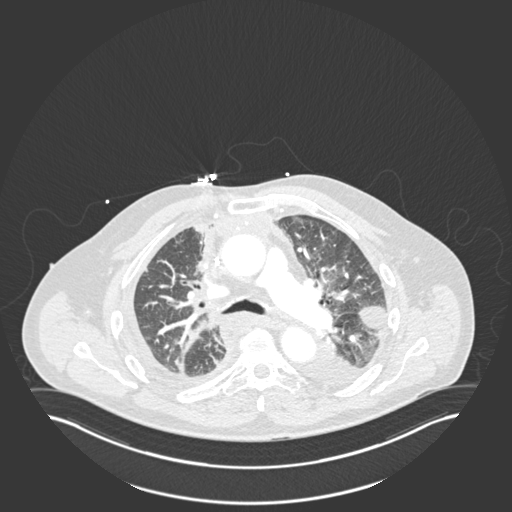
[im 202/303  mediastinal]
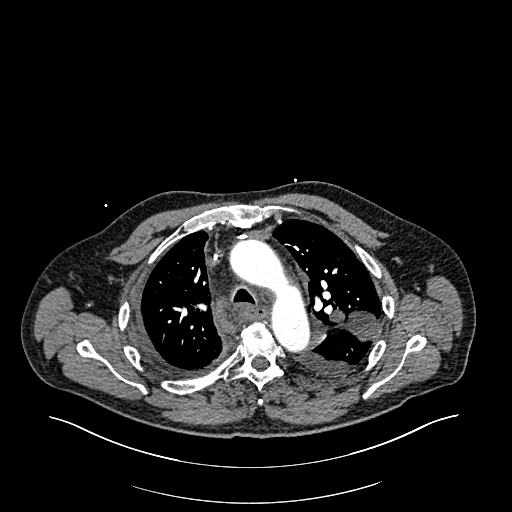
[im 222/303  lung]
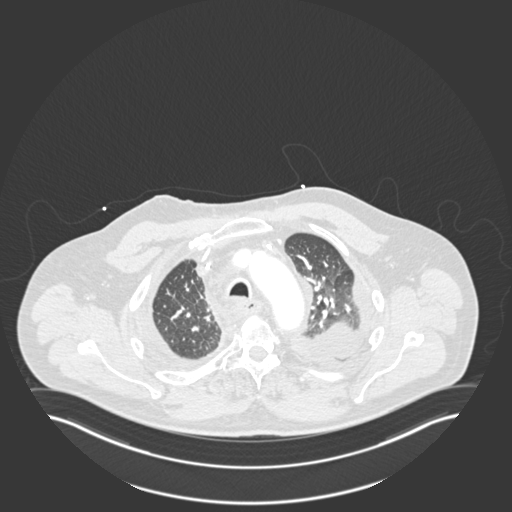
[im 242/303  mediastinal]
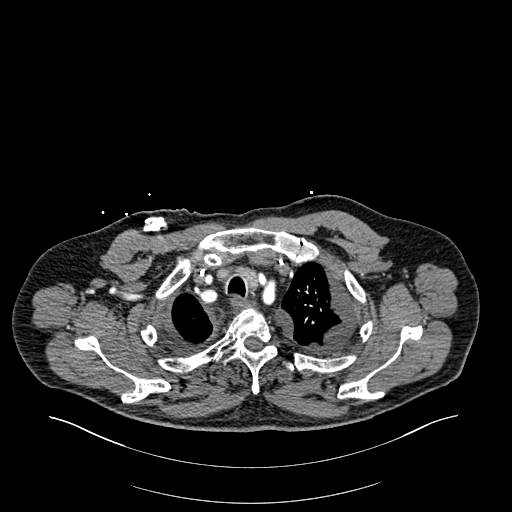
[im 262/303  lung]
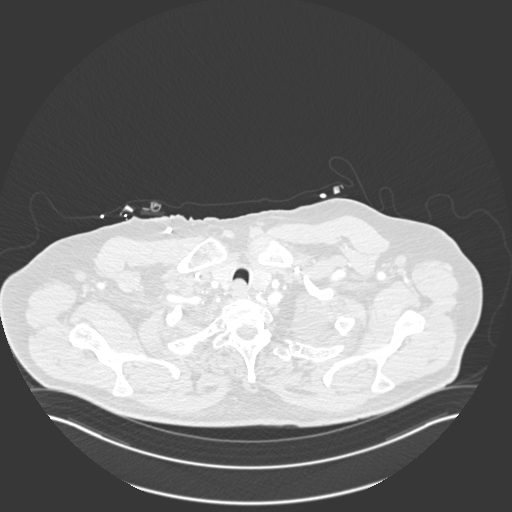
[im 282/303  mediastinal]
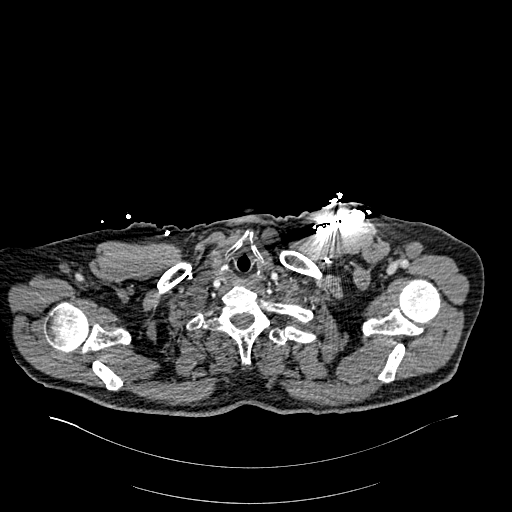

[Series 6: pe lung · axial · 0.98mm/px · z∈[-237,-171]mm · 2 of 88 slices shown]
[im 22/88  mediastinal]
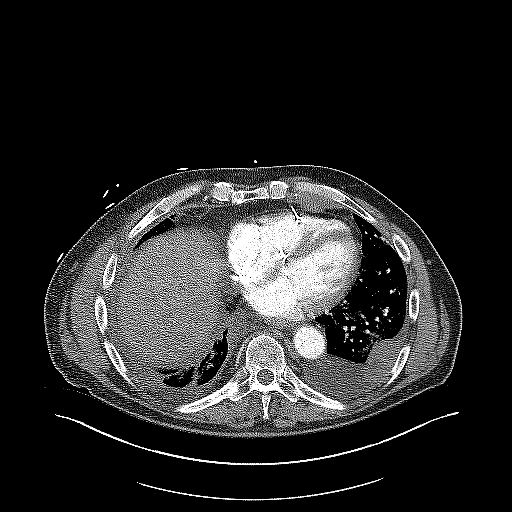
[im 44/88  mediastinal]
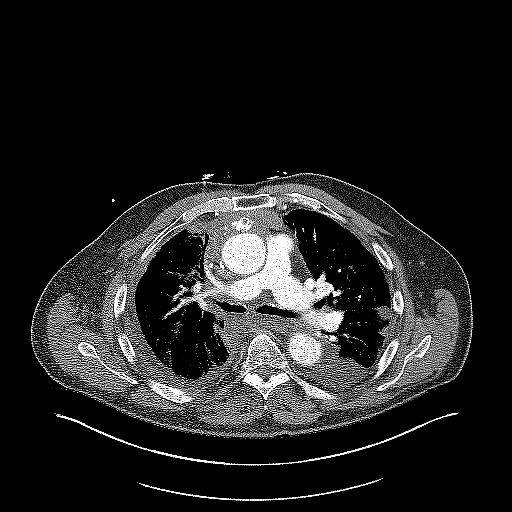

[Series 7: pe coronal mpr · coronal · 0.61mm/px · 1 of 141 slices shown]
[im 71/141  mediastinal]
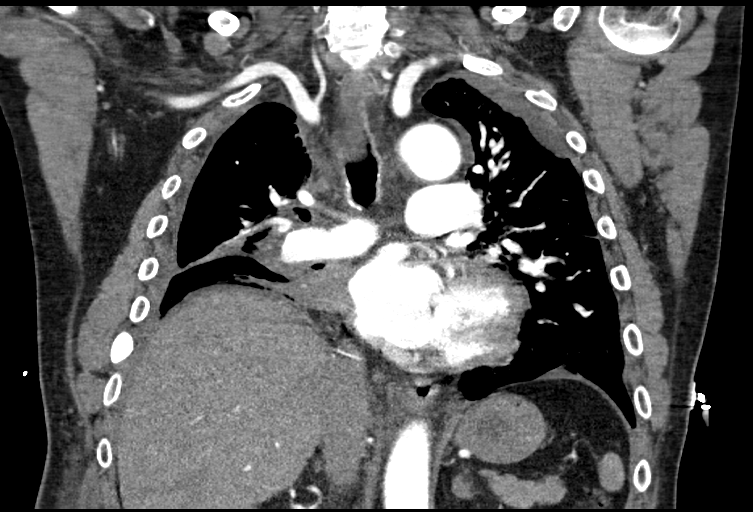

[17 of 36 positions shown; findings below may reference images not displayed]

FINDINGS: Cardiovascular: Satisfactory opacification of the pulmonary arteries
to the segmental level. No evidence of pulmonary embolism. Normal
heart size. No pericardial effusion. Left chest wall single lead
cardiac pacemaker noted. Right chest wall port a catheter tip is in
the cavoatrial junction

Mediastinum/Nodes: Normal appearance of the thyroid gland. Normal
appearance of the esophagus.

Partially calcified soft tissue mass within the anterior mediastinum
a measures 7.6 x 2.7 x 7.3 cm, image, image 49/4 and image 104/9.
This is unchanged when compared with the previous examination.

Soft tissue mass within the right infrahilar region measures 5.0 x
5.4 cm, image 58/4. Previously this measured the same.

Lymph node within the inferior right costophrenic angle has a short
axis of 1.3 cm, image 73/4. Previously this measured the same.

Lungs/Pleura: Signs of bilateral pleural spread of disease is
identified.

-interval increased volume of loculated left pleural effusion. There
is associated volume loss within the left upper and left lower lobe.

-Pleural base mass overlying the posterior lateral left lower lobe
measures 4.4 x 2.4 cm, image 65/4. Previously 4.7 x 3.0 cm

-Rind of soft tissue encasing the right lung is again noted and
appears similar to the previous exam.

-Subsegmental atelectasis and volume loss within the posterior right
upper lobe, medial right middle lobe and right lower lobe is noted
and appears increased from previous exam.

-Subpleural nodule within the right middle lobe measures 2.2 cm,
image 61/6. Previously 2.0 cm.

-Posterior left upper lobe nodule measures 1.7 cm, image 35/6.
Unchanged.

Left ventral chest wall mass (anterior to the right ventricle)
measures 4 x 1.5 cm, image 193/5. Previously 4.0 x 1.9 cm

Upper Abdomen: No acute abnormality within the imaged portions of
the upper abdomen

Cyst within lateral segment of left hepatic lobe are again noted.

The hyperdense slightly exophytic lesion arising off the lateral
cortex of the upper pole of right kidney is again noted measuring 1
cm, image 303/5. Unchanged.

Musculoskeletal: Multifocal areas of sclerotic rib metastases are
identified on the right. Similar to previous exam small rolls node
and superior endplate deformity identified at T7, image 110/9.
Unchanged from previous exam. Sclerotic metastases involving the
inferior aspect of L1 is again noted. New abnormal sclerosis within
the T12 vertebra identified compatible with new site of metastasis.

Review of the MIP images confirms the above findings.
IMPRESSION: 1. No evidence for acute pulmonary embolus.
2. Similar size anterior mediastinal and right hilar soft tissue
masses compatible with known neoplasm.
3. Bilateral pleural base metastases again noted. Compared with the
previous exam there is been interval increase in volume of loculated
left pleural effusion with secondary volume loss of the left upper
and left lower lobes.
4. Similar size of left upper lobe and right middle lobe pulmonary
nodules.
5. Progressive areas of subsegmental atelectasis within the right
upper lobe, right middle lobe and right lower lobe.
6. New area of abnormal sclerosis within the T12 vertebra compatible
with new site of metastasis. Stable appearance of sclerotic
metastases involving the right ribs and L1 vertebra.
7. Aortic atherosclerosis.

Aortic Atherosclerosis ([id number]).
